# Patient Record
Sex: Female | Born: 1955 | Race: Black or African American | Hispanic: No | Smoking: Current every day smoker
Health system: Southern US, Community
[De-identification: ages and names within clinical notes are randomized; demographics above are authoritative.]

## PROBLEM LIST (undated history)

## (undated) DIAGNOSIS — M542 Cervicalgia: Secondary | ICD-10-CM

## (undated) DIAGNOSIS — I10 Essential (primary) hypertension: Secondary | ICD-10-CM

## (undated) DIAGNOSIS — M549 Dorsalgia, unspecified: Secondary | ICD-10-CM

## (undated) DIAGNOSIS — Z8709 Personal history of other diseases of the respiratory system: Secondary | ICD-10-CM

## (undated) DIAGNOSIS — R7303 Prediabetes: Secondary | ICD-10-CM

## (undated) DIAGNOSIS — M2540 Effusion, unspecified joint: Secondary | ICD-10-CM

## (undated) DIAGNOSIS — R002 Palpitations: Secondary | ICD-10-CM

## (undated) DIAGNOSIS — G8929 Other chronic pain: Secondary | ICD-10-CM

## (undated) DIAGNOSIS — K259 Gastric ulcer, unspecified as acute or chronic, without hemorrhage or perforation: Secondary | ICD-10-CM

## (undated) DIAGNOSIS — Z860100 Personal history of colon polyps, unspecified: Secondary | ICD-10-CM

## (undated) DIAGNOSIS — H35039 Hypertensive retinopathy, unspecified eye: Secondary | ICD-10-CM

## (undated) DIAGNOSIS — K862 Cyst of pancreas: Secondary | ICD-10-CM

## (undated) DIAGNOSIS — M419 Scoliosis, unspecified: Secondary | ICD-10-CM

## (undated) DIAGNOSIS — M797 Fibromyalgia: Secondary | ICD-10-CM

## (undated) DIAGNOSIS — M1990 Unspecified osteoarthritis, unspecified site: Secondary | ICD-10-CM

## (undated) DIAGNOSIS — J449 Chronic obstructive pulmonary disease, unspecified: Secondary | ICD-10-CM

## (undated) DIAGNOSIS — E785 Hyperlipidemia, unspecified: Secondary | ICD-10-CM

## (undated) DIAGNOSIS — G4730 Sleep apnea, unspecified: Secondary | ICD-10-CM

## (undated) DIAGNOSIS — K219 Gastro-esophageal reflux disease without esophagitis: Secondary | ICD-10-CM

## (undated) DIAGNOSIS — M810 Age-related osteoporosis without current pathological fracture: Secondary | ICD-10-CM

## (undated) DIAGNOSIS — R531 Weakness: Secondary | ICD-10-CM

## (undated) DIAGNOSIS — R011 Cardiac murmur, unspecified: Secondary | ICD-10-CM

## (undated) DIAGNOSIS — J189 Pneumonia, unspecified organism: Secondary | ICD-10-CM

## (undated) HISTORY — DX: Gastric ulcer, unspecified as acute or chronic, without hemorrhage or perforation: K25.9

## (undated) HISTORY — DX: Prediabetes: R73.03

## (undated) HISTORY — DX: Hypertensive retinopathy, unspecified eye: H35.039

## (undated) HISTORY — PX: COLONOSCOPY: SHX174

## (undated) HISTORY — DX: Age-related osteoporosis without current pathological fracture: M81.0

## (undated) HISTORY — PX: EYE SURGERY: SHX253

## (undated) HISTORY — DX: Cyst of pancreas: K86.2

## (undated) HISTORY — PX: OTHER SURGICAL HISTORY: SHX169

## (undated) HISTORY — PX: ESOPHAGOGASTRODUODENOSCOPY: SHX1529

---

## 2004-02-29 ENCOUNTER — Ambulatory Visit (HOSPITAL_BASED_OUTPATIENT_CLINIC_OR_DEPARTMENT_OTHER): Admission: RE | Admit: 2004-02-29 | Discharge: 2004-02-29 | Payer: Self-pay | Admitting: Family Medicine

## 2011-09-23 DIAGNOSIS — J189 Pneumonia, unspecified organism: Secondary | ICD-10-CM

## 2011-09-23 HISTORY — DX: Pneumonia, unspecified organism: J18.9

## 2012-08-26 ENCOUNTER — Emergency Department (HOSPITAL_COMMUNITY): Payer: Medicaid Other

## 2012-08-26 ENCOUNTER — Inpatient Hospital Stay (HOSPITAL_COMMUNITY)
Admission: AD | Admit: 2012-08-26 | Payer: Self-pay | Source: Other Acute Inpatient Hospital | Admitting: Internal Medicine

## 2012-08-26 ENCOUNTER — Inpatient Hospital Stay (HOSPITAL_COMMUNITY)
Admission: EM | Admit: 2012-08-26 | Discharge: 2012-08-31 | DRG: 469 | Disposition: A | Discharge status: Skilled Nursing Facility | Payer: Medicaid Other | Source: Ambulatory Visit | Attending: Internal Medicine | Admitting: Internal Medicine

## 2012-08-26 ENCOUNTER — Encounter (HOSPITAL_COMMUNITY): Payer: Self-pay | Admitting: Emergency Medicine

## 2012-08-26 ENCOUNTER — Emergency Department (HOSPITAL_COMMUNITY): Admission: EM | Admit: 2012-08-26 | Discharge: 2012-08-26 | Discharge status: Against Medical Advice | Payer: Self-pay

## 2012-08-26 DIAGNOSIS — I4891 Unspecified atrial fibrillation: Secondary | ICD-10-CM | POA: Diagnosis present

## 2012-08-26 DIAGNOSIS — K219 Gastro-esophageal reflux disease without esophagitis: Secondary | ICD-10-CM

## 2012-08-26 DIAGNOSIS — I480 Paroxysmal atrial fibrillation: Secondary | ICD-10-CM

## 2012-08-26 DIAGNOSIS — J449 Chronic obstructive pulmonary disease, unspecified: Secondary | ICD-10-CM

## 2012-08-26 DIAGNOSIS — Y9210 Unspecified residential institution as the place of occurrence of the external cause: Secondary | ICD-10-CM | POA: Diagnosis present

## 2012-08-26 DIAGNOSIS — R031 Nonspecific low blood-pressure reading: Secondary | ICD-10-CM | POA: Diagnosis present

## 2012-08-26 DIAGNOSIS — M549 Dorsalgia, unspecified: Secondary | ICD-10-CM | POA: Diagnosis present

## 2012-08-26 DIAGNOSIS — R55 Syncope and collapse: Secondary | ICD-10-CM | POA: Diagnosis present

## 2012-08-26 DIAGNOSIS — S72009A Fracture of unspecified part of neck of unspecified femur, initial encounter for closed fracture: Principal | ICD-10-CM | POA: Diagnosis present

## 2012-08-26 DIAGNOSIS — IMO0001 Reserved for inherently not codable concepts without codable children: Secondary | ICD-10-CM | POA: Diagnosis present

## 2012-08-26 DIAGNOSIS — E1169 Type 2 diabetes mellitus with other specified complication: Secondary | ICD-10-CM | POA: Diagnosis present

## 2012-08-26 DIAGNOSIS — E785 Hyperlipidemia, unspecified: Secondary | ICD-10-CM

## 2012-08-26 DIAGNOSIS — E119 Type 2 diabetes mellitus without complications: Secondary | ICD-10-CM

## 2012-08-26 DIAGNOSIS — W010XXA Fall on same level from slipping, tripping and stumbling without subsequent striking against object, initial encounter: Secondary | ICD-10-CM | POA: Diagnosis present

## 2012-08-26 DIAGNOSIS — J441 Chronic obstructive pulmonary disease with (acute) exacerbation: Secondary | ICD-10-CM | POA: Diagnosis present

## 2012-08-26 DIAGNOSIS — E569 Vitamin deficiency, unspecified: Secondary | ICD-10-CM

## 2012-08-26 DIAGNOSIS — S72002A Fracture of unspecified part of neck of left femur, initial encounter for closed fracture: Secondary | ICD-10-CM

## 2012-08-26 DIAGNOSIS — G4730 Sleep apnea, unspecified: Secondary | ICD-10-CM | POA: Diagnosis present

## 2012-08-26 DIAGNOSIS — Z720 Tobacco use: Secondary | ICD-10-CM

## 2012-08-26 DIAGNOSIS — F17200 Nicotine dependence, unspecified, uncomplicated: Secondary | ICD-10-CM | POA: Diagnosis present

## 2012-08-26 DIAGNOSIS — G8929 Other chronic pain: Secondary | ICD-10-CM | POA: Diagnosis present

## 2012-08-26 DIAGNOSIS — J189 Pneumonia, unspecified organism: Secondary | ICD-10-CM

## 2012-08-26 DIAGNOSIS — Z2989 Encounter for other specified prophylactic measures: Secondary | ICD-10-CM

## 2012-08-26 DIAGNOSIS — J45901 Unspecified asthma with (acute) exacerbation: Secondary | ICD-10-CM | POA: Diagnosis present

## 2012-08-26 DIAGNOSIS — S7290XA Unspecified fracture of unspecified femur, initial encounter for closed fracture: Secondary | ICD-10-CM

## 2012-08-26 DIAGNOSIS — J1100 Influenza due to unidentified influenza virus with unspecified type of pneumonia: Secondary | ICD-10-CM | POA: Diagnosis present

## 2012-08-26 DIAGNOSIS — Z418 Encounter for other procedures for purposes other than remedying health state: Secondary | ICD-10-CM

## 2012-08-26 HISTORY — DX: Sleep apnea, unspecified: G47.30

## 2012-08-26 HISTORY — DX: Other chronic pain: G89.29

## 2012-08-26 HISTORY — DX: Dorsalgia, unspecified: M54.9

## 2012-08-26 HISTORY — DX: Gastro-esophageal reflux disease without esophagitis: K21.9

## 2012-08-26 HISTORY — DX: Unspecified osteoarthritis, unspecified site: M19.90

## 2012-08-26 HISTORY — DX: Hyperlipidemia, unspecified: E78.5

## 2012-08-26 HISTORY — DX: Scoliosis, unspecified: M41.9

## 2012-08-26 HISTORY — DX: Fibromyalgia: M79.7

## 2012-08-26 MED ORDER — FENTANYL CITRATE 0.05 MG/ML IJ SOLN
25.0000 ug | INTRAMUSCULAR | Status: DC | PRN
  Administered 2012-08-27 (×4): 25 ug via INTRAVENOUS
  Administered 2012-08-27: 50 ug via INTRAVENOUS
  Administered 2012-08-27: 25 ug via INTRAVENOUS
  Filled 2012-08-26 (×3): qty 2

## 2012-08-26 MED ORDER — SODIUM CHLORIDE 0.9 % IV SOLN
INTRAVENOUS | Status: DC
  Administered 2012-08-27: 03:00:00 via INTRAVENOUS

## 2012-08-26 NOTE — ED Notes (Signed)
Pt was at [facility] and confirmed flu positive, was getting chest xray and "felt warm and dizzy and fell" fracturing left hip. At previous hospital received total of 5mg  Dilaudid (1mg  from EMS en route), 4mg  of Morphine, and 4mg  of Zofran since 1715.

## 2012-08-26 NOTE — ED Notes (Signed)
Patient transported to X-ray 

## 2012-08-26 NOTE — ED Provider Notes (Signed)
History     CSN: [id number]  Arrival date & time 08/26/12  2325   First MD Initiated Contact with Patient 08/26/12 2332      No chief complaint on file.   (Consider location/radiation/quality/duration/timing/severity/associated sxs/prior treatment) HPI History provided by patient. Has been sick for the last few days and her physician sent her to the hospital to get a chest x-ray. She been feeling weak and lightheaded and while getting a chest x-ray had a syncopal event and sustained a left hip fracture. This was at [facility]. She was evaluated there, diagnosed with hip fracture and noted to be positive for influenza.  There was no orthopedic surgeon available patient was sent here. Patient denies any shortness of breath, chest pain or fever at this time. She received IV Dilaudid in route and denies any pain. With syncopal event denies any chest pain or shortness of breath. Symptoms moderate to severe. Pain worse with movement of her hip. No associated weakness or numbness. She denies any other pain injury or trauma. No past medical history on file.  No past surgical history on file.  No family history on file.  History  Substance Use Topics  . Smoking status: Not on file  . Smokeless tobacco: Not on file  . Alcohol Use: Not on file    OB History    No data available      Review of Systems  Constitutional: Positive for fever.  HENT: Negative for neck pain, neck stiffness and voice change.   Eyes: Negative for visual disturbance.  Respiratory: Positive for cough. Negative for shortness of breath.   Cardiovascular: Negative for chest pain.  Gastrointestinal: Negative for vomiting and abdominal pain.  Genitourinary: Negative for dysuria.  Musculoskeletal: Negative for back pain.  Skin: Negative for rash.  Neurological: Positive for syncope. Negative for seizures and numbness.  All other systems reviewed and are negative.    Allergies  Review of patient's allergies  indicates not on file.  Home Medications  No current outpatient prescriptions on file.  There were no vitals taken for this visit.  Physical Exam  Constitutional: She is oriented to person, place, and time. She appears well-developed and well-nourished.  HENT:  Head: Normocephalic and atraumatic.  Eyes: Conjunctivae normal and EOM are normal. Pupils are equal, round, and reactive to light.  Neck: Trachea normal. Neck supple. No thyromegaly present.  Cardiovascular: Normal rate, regular rhythm, S1 normal, S2 normal and normal pulses.     No systolic murmur is present   No diastolic murmur is present  Pulses:      Radial pulses are 2+ on the right side, and 2+ on the left side.  Pulmonary/Chest: Effort normal and breath sounds normal. She has no wheezes. She has no rhonchi. She has no rales. She exhibits no tenderness.  Abdominal: Soft. Normal appearance and bowel sounds are normal. There is no tenderness. There is no CVA tenderness and negative Murphy's sign.  Musculoskeletal:       Tender over left greater trochanter with decreased range of motion secondary to pain. Distal neurovascular is intact. Pelvis stable. Nontender over left knee and ankle.  Neurological: She is alert and oriented to person, place, and time. She has normal strength. No cranial nerve deficit or sensory deficit. GCS eye subscore is 4. GCS verbal subscore is 5. GCS motor subscore is 6.  Skin: Skin is warm and dry. No rash noted. She is not diaphoretic.  Psychiatric: Her speech is normal.  Cooperative and appropriate    ED Course  Procedures (including critical care time)  Results for orders placed during the hospital encounter of 08/26/12  CBC WITH DIFFERENTIAL      Component Value Range   WBC 5.3  4.0 - 10.5 K/uL   RBC 4.17  3.87 - 5.11 MIL/uL   Hemoglobin 13.2  12.0 - 15.0 g/dL   HCT 40.9  81.1 - 91.4 %   MCV 94.7  78.0 - 100.0 fL   MCH 31.7  26.0 - 34.0 pg   MCHC 33.4  30.0 - 36.0 g/dL   RDW 78.2   95.6 - 21.3 %   Platelets 129 (*) 150 - 400 K/uL   Neutrophils Relative 66  43 - 77 %   Neutro Abs 3.5  1.7 - 7.7 K/uL   Lymphocytes Relative 21  12 - 46 %   Lymphs Abs 1.1  0.7 - 4.0 K/uL   Monocytes Relative 13 (*) 3 - 12 %   Monocytes Absolute 0.7  0.1 - 1.0 K/uL   Eosinophils Relative 0  0 - 5 %   Eosinophils Absolute 0.0  0.0 - 0.7 K/uL   Basophils Relative 0  0 - 1 %   Basophils Absolute 0.0  0.0 - 0.1 K/uL  PROTIME-INR      Component Value Range   Prothrombin Time 13.5  11.6 - 15.2 seconds   INR 1.04  0.00 - 1.49  TYPE AND SCREEN      Component Value Range   ABO/RH(D) O POS     Antibody Screen NEG     Sample Expiration 08/30/2012    POCT I-STAT, CHEM 8      Component Value Range   Sodium 138  135 - 145 mEq/L   Potassium 3.8  3.5 - 5.1 mEq/L   Chloride 104  96 - 112 mEq/L   BUN 10  6 - 23 mg/dL   Creatinine, Ser 0.86  0.50 - 1.10 mg/dL   Glucose, Bld 578 (*) 70 - 99 mg/dL   Calcium, Ion 4.69 (*) 1.12 - 1.23 mmol/L   TCO2 23  0 - 100 mmol/L   Hemoglobin 13.9  12.0 - 15.0 g/dL   HCT 62.9  52.8 - 41.3 %  ABO/RH      Component Value Range   ABO/RH(D) O POS     Dg Chest 2 View  08/27/2012  *RADIOLOGY REPORT*  Clinical Data: Syncope.  Left hip fracture. Pre-op respiratory exam.  CHEST - 2 VIEW  Comparison:  08/26/2012  Findings:  The heart size and mediastinal contours are within normal limits.  Both lungs are clear.  The visualized skeletal structures are unremarkable.  IMPRESSION: No active cardiopulmonary disease.   Original Report Authenticated By: Myles Rosenthal, M.D.     Date: 08/26/2012  Rate: 84  Rhythm: normal sinus rhythm  QRS Axis: normal  Intervals: normal  ST/T Wave abnormalities: nonspecific ST changes  Conduction Disutrbances:none  Narrative Interpretation:   Old EKG Reviewed: none available  IVFs.NPO. IV fentanyl prn  Outside records reviewed and patient had a blood pressure of 61/45 in the emergency department after syncopal event. She received IV  fluids and imaging of her hip demonstrating left femoral neck fracture. EMS reported positive flu test. Isolation precautions initiated here in the emergency department.  12:21 AM outside films reviewed - has L femoral neck Fx.  Discussed as above with Dr Frederik Pear, will consult in am. Plan MED admit.   1:36 AM d/w DR Joneen Roach,  will admit MDM   Syncope with fall and left hip fracture. Patient tested flu positive at outside hospital. Pain control. Imaging reviewed. EKG. Labs. Ortho consult. Medical admission.        Sunnie Nielsen, MD 08/27/12 [phone]

## 2012-08-27 ENCOUNTER — Emergency Department (HOSPITAL_COMMUNITY): Payer: Medicaid Other

## 2012-08-27 ENCOUNTER — Encounter (HOSPITAL_COMMUNITY): Payer: Self-pay | Admitting: Certified Registered"

## 2012-08-27 ENCOUNTER — Inpatient Hospital Stay (HOSPITAL_COMMUNITY): Payer: Medicaid Other | Admitting: Certified Registered"

## 2012-08-27 ENCOUNTER — Encounter (HOSPITAL_COMMUNITY)
Admission: EM | Disposition: A | Discharge status: Skilled Nursing Facility | Payer: Self-pay | Source: Ambulatory Visit | Attending: Family Medicine

## 2012-08-27 ENCOUNTER — Encounter (HOSPITAL_COMMUNITY): Payer: Self-pay | Admitting: General Practice

## 2012-08-27 DIAGNOSIS — I4891 Unspecified atrial fibrillation: Secondary | ICD-10-CM

## 2012-08-27 DIAGNOSIS — S7290XA Unspecified fracture of unspecified femur, initial encounter for closed fracture: Secondary | ICD-10-CM | POA: Diagnosis present

## 2012-08-27 DIAGNOSIS — E785 Hyperlipidemia, unspecified: Secondary | ICD-10-CM

## 2012-08-27 DIAGNOSIS — I480 Paroxysmal atrial fibrillation: Secondary | ICD-10-CM

## 2012-08-27 DIAGNOSIS — E569 Vitamin deficiency, unspecified: Secondary | ICD-10-CM

## 2012-08-27 DIAGNOSIS — S72009A Fracture of unspecified part of neck of unspecified femur, initial encounter for closed fracture: Principal | ICD-10-CM

## 2012-08-27 DIAGNOSIS — J449 Chronic obstructive pulmonary disease, unspecified: Secondary | ICD-10-CM

## 2012-08-27 DIAGNOSIS — Z720 Tobacco use: Secondary | ICD-10-CM

## 2012-08-27 DIAGNOSIS — E119 Type 2 diabetes mellitus without complications: Secondary | ICD-10-CM

## 2012-08-27 DIAGNOSIS — K219 Gastro-esophageal reflux disease without esophagitis: Secondary | ICD-10-CM

## 2012-08-27 HISTORY — PX: HIP ARTHROPLASTY: SHX981

## 2012-08-27 LAB — TYPE AND SCREEN
ABO/RH(D): O POS
Antibody Screen: NEGATIVE

## 2012-08-27 LAB — CBC
HCT: 37.2 % (ref 36.0–46.0)
Hemoglobin: 12.4 g/dL (ref 12.0–15.0)
MCH: 31.6 pg (ref 26.0–34.0)
MCHC: 33.3 g/dL (ref 30.0–36.0)
MCV: 94.7 fL (ref 78.0–100.0)
RDW: 13.5 % (ref 11.5–15.5)

## 2012-08-27 LAB — HEMOGLOBIN A1C
Hgb A1c MFr Bld: 6.5 % — ABNORMAL HIGH (ref <5.7)
Mean Plasma Glucose: 140 mg/dL — ABNORMAL HIGH (ref <117)

## 2012-08-27 LAB — CBC WITH DIFFERENTIAL/PLATELET
Hemoglobin: 13.2 g/dL (ref 12.0–15.0)
Lymphocytes Relative: 21 % (ref 12–46)
Lymphs Abs: 1.1 10*3/uL (ref 0.7–4.0)
Monocytes Relative: 13 % — ABNORMAL HIGH (ref 3–12)
Neutrophils Relative %: 66 % (ref 43–77)
Platelets: 129 10*3/uL — ABNORMAL LOW (ref 150–400)
RBC: 4.17 MIL/uL (ref 3.87–5.11)
WBC: 5.3 10*3/uL (ref 4.0–10.5)

## 2012-08-27 LAB — GLUCOSE, CAPILLARY
Glucose-Capillary: 114 mg/dL — ABNORMAL HIGH (ref 70–99)
Glucose-Capillary: 220 mg/dL — ABNORMAL HIGH (ref 70–99)

## 2012-08-27 LAB — POCT I-STAT, CHEM 8
Calcium, Ion: 1.1 mmol/L — ABNORMAL LOW (ref 1.12–1.23)
Glucose, Bld: 112 mg/dL — ABNORMAL HIGH (ref 70–99)
HCT: 41 % (ref 36.0–46.0)
Hemoglobin: 13.9 g/dL (ref 12.0–15.0)
TCO2: 23 mmol/L (ref 0–100)

## 2012-08-27 LAB — BASIC METABOLIC PANEL
BUN: 11 mg/dL (ref 6–23)
Creatinine, Ser: 0.8 mg/dL (ref 0.50–1.10)
GFR calc Af Amer: >90 mL/min (ref >90)
GFR calc non Af Amer: 81 mL/min — ABNORMAL LOW (ref >90)
Glucose, Bld: 111 mg/dL — ABNORMAL HIGH (ref 70–99)

## 2012-08-27 LAB — PROTIME-INR
INR: 1.04 (ref 0.00–1.49)
Prothrombin Time: 13.5 seconds (ref 11.6–15.2)

## 2012-08-27 SURGERY — HEMIARTHROPLASTY, HIP, DIRECT ANTERIOR APPROACH, FOR FRACTURE
Anesthesia: General | Site: Hip | Laterality: Left | Wound class: Clean

## 2012-08-27 MED ORDER — SENNOSIDES-DOCUSATE SODIUM 8.6-50 MG PO TABS: 1.0000 | ORAL_TABLET | Freq: Every evening | ORAL | Status: DC | PRN

## 2012-08-27 MED ORDER — PANTOPRAZOLE SODIUM 40 MG PO TBEC
40.0000 mg | DELAYED_RELEASE_TABLET | Freq: Every day | ORAL | Status: DC
  Administered 2012-08-28: 40 mg via ORAL
  Filled 2012-08-27: qty 1

## 2012-08-27 MED ORDER — ENOXAPARIN SODIUM 40 MG/0.4ML ~~LOC~~ SOLN
40.0000 mg | SUBCUTANEOUS | Status: DC
  Administered 2012-08-28 – 2012-08-30 (×3): 40 mg via SUBCUTANEOUS
  Filled 2012-08-27 (×5): qty 0.4

## 2012-08-27 MED ORDER — GLIPIZIDE ER 10 MG PO TB24
10.0000 mg | ORAL_TABLET | Freq: Every day | ORAL | Status: DC
  Administered 2012-08-28 – 2012-08-29 (×2): 10 mg via ORAL
  Filled 2012-08-27 (×4): qty 1

## 2012-08-27 MED ORDER — ROCURONIUM BROMIDE 100 MG/10ML IV SOLN
INTRAVENOUS | Status: DC | PRN
  Administered 2012-08-27: 50 mg via INTRAVENOUS

## 2012-08-27 MED ORDER — LIDOCAINE HCL (CARDIAC) 20 MG/ML IV SOLN
INTRAVENOUS | Status: DC | PRN
  Administered 2012-08-27: 80 mg via INTRAVENOUS

## 2012-08-27 MED ORDER — LACTATED RINGERS IV SOLN
INTRAVENOUS | Status: DC
  Administered 2012-08-27: 13:00:00 via INTRAVENOUS

## 2012-08-27 MED ORDER — GLYCOPYRROLATE 0.2 MG/ML IJ SOLN
INTRAMUSCULAR | Status: DC | PRN
  Administered 2012-08-27: 0.4 mg via INTRAVENOUS

## 2012-08-27 MED ORDER — IPRATROPIUM BROMIDE 0.02 % IN SOLN: 0.5000 mg | RESPIRATORY_TRACT | Status: DC | PRN

## 2012-08-27 MED ORDER — NICOTINE 14 MG/24HR TD PT24
14.0000 mg | MEDICATED_PATCH | Freq: Every day | TRANSDERMAL | Status: DC
  Administered 2012-08-27 – 2012-08-31 (×5): 14 mg via TRANSDERMAL
  Filled 2012-08-27 (×5): qty 1

## 2012-08-27 MED ORDER — ACEBUTOLOL HCL 400 MG PO CAPS
400.0000 mg | ORAL_CAPSULE | Freq: Two times a day (BID) | ORAL | Status: DC
  Administered 2012-08-27 – 2012-08-31 (×8): 400 mg via ORAL
  Filled 2012-08-27 (×10): qty 1

## 2012-08-27 MED ORDER — NEOSTIGMINE METHYLSULFATE 1 MG/ML IJ SOLN
INTRAMUSCULAR | Status: DC | PRN
  Administered 2012-08-27: 3 mg via INTRAVENOUS

## 2012-08-27 MED ORDER — MORPHINE SULFATE 2 MG/ML IJ SOLN
2.0000 mg | INTRAMUSCULAR | Status: DC | PRN
  Administered 2012-08-27 – 2012-08-28 (×6): 2 mg via INTRAVENOUS
  Filled 2012-08-27 (×6): qty 1

## 2012-08-27 MED ORDER — FENTANYL CITRATE 0.05 MG/ML IJ SOLN
INTRAMUSCULAR | Status: AC
  Filled 2012-08-27: qty 2

## 2012-08-27 MED ORDER — ACETAMINOPHEN 325 MG PO TABS
650.0000 mg | ORAL_TABLET | Freq: Four times a day (QID) | ORAL | Status: DC | PRN
  Administered 2012-08-27 – 2012-08-28 (×2): 650 mg via ORAL
  Filled 2012-08-27 (×2): qty 2

## 2012-08-27 MED ORDER — MIDAZOLAM HCL 5 MG/5ML IJ SOLN
INTRAMUSCULAR | Status: DC | PRN
  Administered 2012-08-27: 2 mg via INTRAVENOUS

## 2012-08-27 MED ORDER — LACTATED RINGERS IV SOLN
INTRAVENOUS | Status: DC | PRN
  Administered 2012-08-27 (×2): via INTRAVENOUS

## 2012-08-27 MED ORDER — FENTANYL CITRATE 0.05 MG/ML IJ SOLN
INTRAMUSCULAR | Status: DC | PRN
  Administered 2012-08-27 (×2): 50 ug via INTRAVENOUS
  Administered 2012-08-27: 100 ug via INTRAVENOUS
  Administered 2012-08-27: 50 ug via INTRAVENOUS

## 2012-08-27 MED ORDER — SUCRALFATE 1 G PO TABS
1.0000 g | ORAL_TABLET | Freq: Four times a day (QID) | ORAL | Status: DC
  Administered 2012-08-27 – 2012-08-31 (×6): 1 g via ORAL
  Filled 2012-08-27 (×20): qty 1

## 2012-08-27 MED ORDER — ONDANSETRON HCL 4 MG PO TABS: 4.0000 mg | ORAL_TABLET | Freq: Four times a day (QID) | ORAL | Status: DC | PRN

## 2012-08-27 MED ORDER — INSULIN ASPART 100 UNIT/ML ~~LOC~~ SOLN
0.0000 [IU] | Freq: Four times a day (QID) | SUBCUTANEOUS | Status: DC
  Administered 2012-08-27: 5 [IU] via SUBCUTANEOUS
  Administered 2012-08-28: 3 [IU] via SUBCUTANEOUS
  Administered 2012-08-28: 2 [IU] via SUBCUTANEOUS
  Administered 2012-08-28: 5 [IU] via SUBCUTANEOUS
  Administered 2012-08-29: 3 [IU] via SUBCUTANEOUS

## 2012-08-27 MED ORDER — ALBUTEROL SULFATE (5 MG/ML) 0.5% IN NEBU: 2.5000 mg | INHALATION_SOLUTION | RESPIRATORY_TRACT | Status: DC | PRN

## 2012-08-27 MED ORDER — PREDNISONE 50 MG PO TABS
50.0000 mg | ORAL_TABLET | Freq: Every day | ORAL | Status: DC
Start: 2012-08-28 — End: 2012-08-28
  Administered 2012-08-28: 50 mg via ORAL
  Filled 2012-08-27 (×2): qty 1

## 2012-08-27 MED ORDER — ONDANSETRON HCL 4 MG/2ML IJ SOLN: 4.0000 mg | Freq: Four times a day (QID) | INTRAMUSCULAR | Status: DC | PRN

## 2012-08-27 MED ORDER — ONDANSETRON HCL 4 MG/2ML IJ SOLN
INTRAMUSCULAR | Status: DC | PRN
  Administered 2012-08-27: 4 mg via INTRAVENOUS

## 2012-08-27 MED ORDER — CEFAZOLIN SODIUM-DEXTROSE 2-3 GM-% IV SOLR
INTRAVENOUS | Status: DC | PRN
  Administered 2012-08-27: 2 g via INTRAVENOUS

## 2012-08-27 MED ORDER — PHENYLEPHRINE HCL 10 MG/ML IJ SOLN
INTRAMUSCULAR | Status: DC | PRN
  Administered 2012-08-27 (×3): 80 ug via INTRAVENOUS

## 2012-08-27 MED ORDER — METFORMIN HCL 500 MG PO TABS
500.0000 mg | ORAL_TABLET | Freq: Two times a day (BID) | ORAL | Status: DC
  Administered 2012-08-28 – 2012-08-31 (×7): 500 mg via ORAL
  Filled 2012-08-27 (×11): qty 1

## 2012-08-27 MED ORDER — SODIUM CHLORIDE 0.9 % IR SOLN
Status: DC | PRN
  Administered 2012-08-27: 1000 mL

## 2012-08-27 MED ORDER — PROPOFOL 10 MG/ML IV BOLUS
INTRAVENOUS | Status: DC | PRN
  Administered 2012-08-27: 180 mg via INTRAVENOUS

## 2012-08-27 MED ORDER — SODIUM CHLORIDE 0.9 % IV BOLUS (SEPSIS)
500.0000 mL | Freq: Once | INTRAVENOUS | Status: AC
  Administered 2012-08-27: 500 mL via INTRAVENOUS

## 2012-08-27 MED ORDER — LEVOFLOXACIN 750 MG PO TABS
750.0000 mg | ORAL_TABLET | Freq: Every day | ORAL | Status: DC
  Administered 2012-08-27 – 2012-08-31 (×5): 750 mg via ORAL
  Filled 2012-08-27 (×6): qty 1

## 2012-08-27 MED ORDER — ALUM & MAG HYDROXIDE-SIMETH 200-200-20 MG/5ML PO SUSP: 30.0000 mL | Freq: Four times a day (QID) | ORAL | Status: DC | PRN

## 2012-08-27 MED ORDER — ACETAMINOPHEN 650 MG RE SUPP: 650.0000 mg | Freq: Four times a day (QID) | RECTAL | Status: DC | PRN

## 2012-08-27 MED ORDER — POTASSIUM CHLORIDE IN NACL 20-0.9 MEQ/L-% IV SOLN
INTRAVENOUS | Status: DC
  Administered 2012-08-27 – 2012-08-28 (×5): via INTRAVENOUS
  Filled 2012-08-27 (×7): qty 1000

## 2012-08-27 MED ORDER — OXYCODONE-ACETAMINOPHEN 5-325 MG PO TABS
1.0000 | ORAL_TABLET | ORAL | Status: DC | PRN
  Administered 2012-08-27: 1 via ORAL
  Administered 2012-08-27: 2 via ORAL
  Administered 2012-08-28 – 2012-08-31 (×6): 1 via ORAL
  Filled 2012-08-27 (×7): qty 1

## 2012-08-27 MED ORDER — ACETAMINOPHEN 10 MG/ML IV SOLN
INTRAVENOUS | Status: DC | PRN
  Administered 2012-08-27: 1000 mg via INTRAVENOUS

## 2012-08-27 SURGICAL SUPPLY — 62 items
BLADE SAW SAG 73X25 THK (BLADE) ×1
BLADE SAW SGTL 73X25 THK (BLADE) ×1 IMPLANT
BLADE SURG ROTATE 9660 (MISCELLANEOUS) IMPLANT
BRUSH FEMORAL CANAL (MISCELLANEOUS) IMPLANT
CLOTH BEACON ORANGE TIMEOUT ST (SAFETY) ×2 IMPLANT
COVER SURGICAL LIGHT HANDLE (MISCELLANEOUS) ×2 IMPLANT
DRAPE INCISE IOBAN 66X45 STRL (DRAPES) IMPLANT
DRAPE ORTHO SPLIT 77X108 STRL (DRAPES) ×2
DRAPE PROXIMA HALF (DRAPES) ×2 IMPLANT
DRAPE SURG ORHT 6 SPLT 77X108 (DRAPES) ×2 IMPLANT
DRAPE U-SHAPE 47X51 STRL (DRAPES) ×2 IMPLANT
DRILL BIT 7/64X5 (BIT) ×2 IMPLANT
DRSG ADAPTIC 3X8 NADH LF (GAUZE/BANDAGES/DRESSINGS) IMPLANT
DRSG MEPILEX BORDER 4X12 (GAUZE/BANDAGES/DRESSINGS) ×2 IMPLANT
DRSG PAD ABDOMINAL 8X10 ST (GAUZE/BANDAGES/DRESSINGS) IMPLANT
DURAPREP 26ML APPLICATOR (WOUND CARE) ×2 IMPLANT
ELECT BLADE 6.5 EXT (BLADE) IMPLANT
ELECT REM PT RETURN 9FT ADLT (ELECTROSURGICAL) ×2
ELECTRODE REM PT RTRN 9FT ADLT (ELECTROSURGICAL) ×1 IMPLANT
EVACUATOR 1/8 PVC DRAIN (DRAIN) IMPLANT
GLOVE BIO SURGEON STRL SZ8.5 (GLOVE) IMPLANT
GLOVE BIOGEL PI IND STRL 8 (GLOVE) ×1 IMPLANT
GLOVE BIOGEL PI IND STRL 8.5 (GLOVE) IMPLANT
GLOVE BIOGEL PI INDICATOR 8 (GLOVE) ×1
GLOVE BIOGEL PI INDICATOR 8.5 (GLOVE)
GLOVE SS BIOGEL STRL SZ 8 (GLOVE) ×1 IMPLANT
GLOVE SUPERSENSE BIOGEL SZ 8 (GLOVE) ×1
GOWN PREVENTION PLUS XLARGE (GOWN DISPOSABLE) ×4 IMPLANT
GOWN PREVENTION PLUS XXLARGE (GOWN DISPOSABLE) IMPLANT
GOWN STRL NON-REIN LRG LVL3 (GOWN DISPOSABLE) ×2 IMPLANT
HANDPIECE INTERPULSE COAX TIP (DISPOSABLE)
HOOD PEEL AWAY FACE SHEILD DIS (HOOD) IMPLANT
IMMOBILIZER KNEE 20 (SOFTGOODS)
IMMOBILIZER KNEE 20 THIGH 36 (SOFTGOODS) IMPLANT
IMMOBILIZER KNEE 22 UNIV (SOFTGOODS) ×2 IMPLANT
IMMOBILIZER KNEE 24 THIGH 36 (MISCELLANEOUS) IMPLANT
IMMOBILIZER KNEE 24 UNIV (MISCELLANEOUS)
KIT BASIN OR (CUSTOM PROCEDURE TRAY) ×2 IMPLANT
KIT ROOM TURNOVER OR (KITS) ×2 IMPLANT
MANIFOLD NEPTUNE II (INSTRUMENTS) ×2 IMPLANT
NDL SUT 2 .5 CRC MAYO 1.732X (NEEDLE) IMPLANT
NEEDLE 1/2 CIR MAYO (NEEDLE) IMPLANT
NEEDLE MAYO TAPER (NEEDLE)
NS IRRIG 1000ML POUR BTL (IV SOLUTION) ×2 IMPLANT
PACK TOTAL JOINT (CUSTOM PROCEDURE TRAY) ×2 IMPLANT
PAD ARMBOARD 7.5X6 YLW CONV (MISCELLANEOUS) ×2 IMPLANT
PASSER SUT SWANSON 36MM LOOP (INSTRUMENTS) ×2 IMPLANT
SET HNDPC FAN SPRY TIP SCT (DISPOSABLE) IMPLANT
SPONGE GAUZE 4X4 12PLY (GAUZE/BANDAGES/DRESSINGS) IMPLANT
STAPLER VISISTAT 35W (STAPLE) ×2 IMPLANT
SUCTION FRAZIER TIP 10 FR DISP (SUCTIONS) ×2 IMPLANT
SUT ETHIBOND NAB CT1 #1 30IN (SUTURE) ×6 IMPLANT
SUT VIC AB 0 CT1 27 (SUTURE) ×1
SUT VIC AB 0 CT1 27XBRD ANBCTR (SUTURE) ×1 IMPLANT
SUT VIC AB 1 CTB1 27 (SUTURE) ×4 IMPLANT
SUT VIC AB 2-0 CT1 27 (SUTURE) ×1
SUT VIC AB 2-0 CT1 TAPERPNT 27 (SUTURE) ×1 IMPLANT
TOWEL OR 17X24 6PK STRL BLUE (TOWEL DISPOSABLE) ×2 IMPLANT
TOWEL OR 17X26 10 PK STRL BLUE (TOWEL DISPOSABLE) ×2 IMPLANT
TOWER CARTRIDGE SMART MIX (DISPOSABLE) IMPLANT
TRAY FOLEY CATH 14FR (SET/KITS/TRAYS/PACK) IMPLANT
WATER STERILE IRR 1000ML POUR (IV SOLUTION) ×4 IMPLANT

## 2012-08-27 NOTE — Preoperative (Signed)
Beta Blockers   Reason not to administer Beta Blockers:Not Applicable 

## 2012-08-27 NOTE — ED Notes (Signed)
Patient is resting comfortably. Phlebotomy at bedside

## 2012-08-27 NOTE — H&P (Signed)
PCP:   Westley Foots    Chief Complaint:  Femur fracture  HPI: This is an unfortunate 56 year old female who has been ill with respiratory symptoms all week. She reports some wheezing, shortness of breath, nonproductive cough . She was sent for chest x-ray today, while in the hospital radiology department she syncopized and fell. She was unable to get up, she was taken to the ER where x-rays revealed femoral neck fracture. Initially patient's blood pressure 6/45, she received IV fluid hydration and has responded appropriately. Patient denies any chest pains or palpitations prior to syncopizing. She does report some chest pain more coughing since she's been feeling ill. History provided by the patient  Review of Systems:  The patient denies anorexia, fever, weight loss,, vision loss, decreased hearing, hoarseness, dyspnea on exertion, peripheral edema, balance deficits, hemoptysis, abdominal pain, melena, hematochezia, severe indigestion/heartburn, hematuria, incontinence, genital sores, muscle weakness, suspicious skin lesions, transient blindness, difficulty walking, depression, unusual weight change, abnormal bleeding, enlarged lymph nodes, angioedema, and breast masses.  Past Medical History: Past Medical History  Diagnosis Date  . Asthma   . Hyperlipidemia   . GERD (gastroesophageal reflux disease)   . Scoliosis   . Flu   . Atrial fibrillation   . Sleep apnea     "have a mask but I don't wear it" (08/27/2012)  . Type II diabetes mellitus     "borderline" (08/27/2012)  . Arthritis     "joints; fingers; shoulders; knees; back" (08/27/2012)  . Chronic back pain   . Fibromyalgia     "dx'd long time ago; before they really knew what it was" (112/02/2012)  . Syncope and collapse 08/26/2012    "broke left hip" (08/27/2012)   Past Surgical History  Procedure Date  . No past surgeries     Medications: Prior to Admission medications   Medication Sig Start Date End Date Taking? Authorizing  Provider  acebutolol (SECTRAL) 400 MG capsule Take 400 mg by mouth 2 (two) times daily.   Yes Historical Provider, MD  albuterol (PROVENTIL HFA;VENTOLIN HFA) 108 (90 BASE) MCG/ACT inhaler Inhale 2 puffs into the lungs every 6 (six) hours as needed. For breathing   Yes Historical Provider, MD  albuterol-ipratropium (COMBIVENT) 18-103 MCG/ACT inhaler Inhale 2 puffs into the lungs every 6 (six) hours as needed. For breathing   Yes Historical Provider, MD  desvenlafaxine (PRISTIQ) 50 MG 24 hr tablet Take 50 mg by mouth daily.   Yes Historical Provider, MD  glipiZIDE (GLUCOTROL XL) 10 MG 24 hr tablet Take 10 mg by mouth daily.   Yes Historical Provider, MD  lovastatin (MEVACOR) 20 MG tablet Take 20 mg by mouth at bedtime.   Yes Historical Provider, MD  magnesium oxide (MAG-OX) 400 MG tablet Take 400 mg by mouth daily.   Yes Historical Provider, MD  medroxyPROGESTERone (DEPO-PROVERA) 150 MG/ML injection Inject 150 mg into the muscle every 3 (three) months.   Yes Historical Provider, MD  metFORMIN (GLUCOPHAGE) 500 MG tablet Take 500 mg by mouth 2 (two) times daily with a meal.   Yes Historical Provider, MD  omeprazole (PRILOSEC) 20 MG capsule Take 20 mg by mouth daily.   Yes Historical Provider, MD  oxyCODONE-acetaminophen (PERCOCET/ROXICET) 5-325 MG per tablet Take 1 tablet by mouth every 4 (four) hours as needed. For pain   Yes Historical Provider, MD  sucralfate (CARAFATE) 1 G tablet Take 1 g by mouth 4 (four) times daily.   Yes Historical Provider, MD    Allergies:  No Known  Allergies  Social History:  reports that she has been smoking Cigarettes.  She has a 18 pack-year smoking history. She has never used smokeless tobacco. She reports that she does not drink alcohol or use illicit drugs.  Family History: Family History  Problem Relation Age of Onset  . Breast cancer    . Throat cancer    . Lung cancer      Physical Exam: Filed Vitals:   08/26/12 2336 08/26/12 2345 08/27/12 0130  BP:  129/71 129/81 108/54  Pulse:  90 89  Temp: 99.7 F (37.6 C)    TempSrc: Oral    Resp: 20 17 18   SpO2: 92% 97% 92%    General:  Alert and oriented times three, well developed and nourished, no acute distress Eyes: PERRLA, pink conjunctiva, no scleral icterus ENT: dry oral mucosa, neck supple, no thyromegaly Lungs: clear to ascultation, no wheeze, no crackles, no use of accessory muscles Cardiovascular: regular rate and rhythm, no regurgitation, no gallops, no murmurs. No carotid bruits, no JVD Abdomen: soft, positive BS, non-tender, non-distended, no organomegaly, not an acute abdomen GU: not examined Neuro: CN II - XII grossly intact, sensation intact Musculoskeletal: strength 5/5 all extremities except left lower extremity no clubbing, cyanosis or edema Skin: no rash, no subcutaneous crepitation, no decubitus Psych: appropriate patient   Labs on Admission:   Basename 08/27/12 0107  NA 138  K 3.8  CL 104  CO2 --  GLUCOSE 112*  BUN 10  CREATININE 0.90  CALCIUM --  MG --  PHOS --   No results found for this basename: AST:2,ALT:2,ALKPHOS:2,BILITOT:2,PROT:2,ALBUMIN:2 in the last 72 hours No results found for this basename: LIPASE:2,AMYLASE:2 in the last 72 hours  Basename 08/27/12 0107 08/27/12 0023  WBC -- 5.3  NEUTROABS -- 3.5  HGB 13.9 13.2  HCT 41.0 39.5  MCV -- 94.7  PLT -- 129*    Basename 08/27/12 0023  CKTOTAL --  CKMB --  CKMBINDEX --  TROPONINI <0.30    Micro Results: No results found for this or any previous visit (from the past 240 hour(s)).   Radiological Exams on Admission: Dg Chest 2 View  08/27/2012  *RADIOLOGY REPORT*  Clinical Data: Syncope.  Left hip fracture. Pre-op respiratory exam.  CHEST - 2 VIEW  Comparison:  08/26/2012  Findings:  The heart size and mediastinal contours are within normal limits.  Both lungs are clear.  The visualized skeletal structures are unremarkable.  IMPRESSION: No active cardiopulmonary disease.   Original Report  Authenticated By: Myles Rosenthal, M.D.    X-ray left hip: [From [facility]]: Left femoral neck fracture EKG: Normal sinus rhythm  Assessment/Plan Present on Admission:  . Femoral fractureleft Admit to MedSurg N.p.o., IV fluid hydration, pain control Orthopedic surgeon Dr. Daniel Nones is aware Patient is a minimum risk surgical intervention Bronchitis/COPD exacerbation Tobacco abuse Nicotine patch, nebulizers and antibiotics Syncope Due to transient hypotension, IV fluid hydration Monitor in telemetry Diabetes mellitus Dyslipidemia Paroxysmal A. Fib Stable resume home medications, ADA diet and set scale insulin  Full code DVT prophylaxis  Sabien Umland 08/27/2012, 2:09 AM

## 2012-08-27 NOTE — Anesthesia Procedure Notes (Signed)
Procedure Name: Intubation Date/Time: 08/27/2012 2:43 PM Performed by: Jerilee Hoh Pre-anesthesia Checklist: Patient identified, Emergency Drugs available, Suction available and Patient being monitored Patient Re-evaluated:Patient Re-evaluated prior to inductionOxygen Delivery Method: Circle system utilized Preoxygenation: Pre-oxygenation with 100% oxygen Intubation Type: IV induction Ventilation: Mask ventilation without difficulty Laryngoscope Size: Mac and 3 Grade View: Grade I Tube type: Oral Tube size: 7.5 mm Number of attempts: 1 Airway Equipment and Method: Stylet Placement Confirmation: ETT inserted through vocal cords under direct vision,  positive ETCO2 and breath sounds checked- equal and bilateral Secured at: 22 cm Tube secured with: Tape Dental Injury: Teeth and Oropharynx as per pre-operative assessment

## 2012-08-27 NOTE — Interval H&P Note (Signed)
History and Physical Interval Note:  08/27/2012 1:54 PM  Tina [name]  has presented today for surgery, with the diagnosis of l fem neck fracture  The various methods of treatment have been discussed with the patient and family. After consideration of risks, benefits and other options for treatment, the patient has consented to  Procedure(s) (LRB) with comments: ARTHROPLASTY BIPOLAR HIP (Left) as a surgical intervention .  The patient's history has been reviewed, patient examined, no change in status, stable for surgery.  I have reviewed the patient's chart and labs.  Questions were answered to the patient's satisfaction.     Sahana Boyland G

## 2012-08-27 NOTE — Anesthesia Preprocedure Evaluation (Addendum)
Anesthesia Evaluation  Patient identified by MRN, date of birth, ID band Patient awake    Reviewed: Allergy & Precautions, H&P , NPO status   Airway Mallampati: I  Neck ROM: Full    Dental  (+) Teeth Intact, Caps and Dental Advisory Given,    Pulmonary asthma , sleep apnea , COPDCurrent Smoker,  Pt w flu like illness vs pneumonia, +_cough   + wheezing      Cardiovascular + dysrhythmias Rhythm:Regular Rate:Normal     Neuro/Psych  Neuromuscular disease    GI/Hepatic GERD-  ,  Endo/Other  diabetes, Type 2  Renal/GU      Musculoskeletal  (+) Fibromyalgia -  Abdominal (+) + obese,   Peds  Hematology   Anesthesia Other Findings   Reproductive/Obstetrics                          Anesthesia Physical Anesthesia Plan  ASA: III  Anesthesia Plan: General   Post-op Pain Management:    Induction: Intravenous  Airway Management Planned: Oral ETT  Additional Equipment:   Intra-op Plan:   Post-operative Plan: Extubation in OR  Informed Consent: I have reviewed the patients History and Physical, chart, labs and discussed the procedure including the risks, benefits and alternatives for the proposed anesthesia with the patient or authorized representative who has indicated his/her understanding and acceptance.   Dental advisory given  Plan Discussed with: CRNA and Surgeon  Anesthesia Plan Comments:         Anesthesia Quick Evaluation

## 2012-08-27 NOTE — Transfer of Care (Signed)
Immediate Anesthesia Transfer of Care Note  Patient: Tina [name]  Procedure(s) Performed: Procedure(s) (LRB) with comments: ARTHROPLASTY BIPOLAR HIP (Left)  Patient Location: PACU  Anesthesia Type:General  Level of Consciousness: awake, alert , oriented and patient cooperative  Airway & Oxygen Therapy: Patient Spontanous Breathing and Patient connected to face mask oxygen  Post-op Assessment: Report given to PACU RN, Post -op Vital signs reviewed and stable and Patient moving all extremities  Post vital signs: Reviewed and stable  Complications: No apparent anesthesia complications

## 2012-08-27 NOTE — OR Nursing (Signed)
Maintained on droplet precautions

## 2012-08-27 NOTE — Progress Notes (Signed)
56 y/o female with hx of asthma, remote hx of a. Fib, GERD, sleep apnea and diabetes; came to the hospital after midnight after been sick for more than 1 week with respiratory symptoms. Patient reports difficulty breathing, intermittent fever, cough, general malaise and decrease PO intake (fluids and solids). While patient was waiting in registration to be seen she pass out and during the fall adquire a femoral neck fracture. Please referred to H&P dictated by Dr. Joneen Roach for further details on admission.  At this moment on my exam patient is doing better and just complaining of mild pain on her left Hip; reports breathing is better. But still spiked fever overnight.   Plan: -observe on telemetry to make there is not further arrhythmias causing her to pass out -continue nebulizer tx, prednisone and antibiotics -IVF's resuscitation -Femoral fracture repair per ortho -follow flu test; patient more than 1 week of symptoms; at this point will only treat with tamiflu if flu test is positive. -Droplet precautions while waiting flu results. -further plan of care as per A/P on Dr. Lajoyce Lauber note.  Canna Nickelson [phone]

## 2012-08-27 NOTE — Consult Note (Signed)
Reason for Consult:   Left hip fracture Referring Physician:    Ruther Ephraim is an 56 y.o. female  Who has had respiratory symptoms for a week and was at [facility] near where she lives yesterday getting a CXR when she slipped and fell and could not get up.  Noted to have displaced left femoral neck fracture.  [facility] apparently has no operative orthopedists and she was transferred in via [facility].  Admitted to medicine for treatment of her flu ORS is consulted about the hip.  Denies pain elsewhere.    Past Medical History  Diagnosis Date  . Asthma   . Hyperlipidemia   . GERD (gastroesophageal reflux disease)   . Scoliosis   . Flu   . Atrial fibrillation   . Sleep apnea     "have a mask but I don't wear it" (08/27/2012)  . Type II diabetes mellitus     "borderline" (08/27/2012)  . Arthritis     "joints; fingers; shoulders; knees; back" (08/27/2012)  . Chronic back pain   . Fibromyalgia     "dx'd long time ago; before they really knew what it was" (112/02/2012)  . Syncope and collapse 08/26/2012    "broke left hip" (08/27/2012)    Past Surgical History  Procedure Date  . No past surgeries     Family History  Problem Relation Age of Onset  . Breast cancer    . Throat cancer    . Lung cancer      Social History:  reports that she has been smoking Cigarettes.  She has a 18 pack-year smoking history. She has never used smokeless tobacco. She reports that she does not drink alcohol or use illicit drugs.On disability due to back issues mostly.  Allergies: No Known Allergies  Medications: I have reviewed the patient's current medications.  Results for orders placed during the hospital encounter of 08/26/12 (from the past 48 hour(s))  CBC WITH DIFFERENTIAL     Status: Abnormal   Collection Time   08/27/12 12:23 AM      Component Value Range Comment   WBC 5.3  4.0 - 10.5 K/uL    RBC 4.17  3.87 - 5.11 MIL/uL    Hemoglobin 13.2  12.0 - 15.0 g/dL    HCT  16.1  09.6 - 04.5 %    MCV 94.7  78.0 - 100.0 fL    MCH 31.7  26.0 - 34.0 pg    MCHC 33.4  30.0 - 36.0 g/dL    RDW 40.9  81.1 - 91.4 %    Platelets 129 (*) 150 - 400 K/uL    Neutrophils Relative 66  43 - 77 %    Neutro Abs 3.5  1.7 - 7.7 K/uL    Lymphocytes Relative 21  12 - 46 %    Lymphs Abs 1.1  0.7 - 4.0 K/uL    Monocytes Relative 13 (*) 3 - 12 %    Monocytes Absolute 0.7  0.1 - 1.0 K/uL    Eosinophils Relative 0  0 - 5 %    Eosinophils Absolute 0.0  0.0 - 0.7 K/uL    Basophils Relative 0  0 - 1 %    Basophils Absolute 0.0  0.0 - 0.1 K/uL   PROTIME-INR     Status: Normal   Collection Time   08/27/12 12:23 AM      Component Value Range Comment   Prothrombin Time 13.5  11.6 - 15.2 seconds  INR 1.04  0.00 - 1.49   TROPONIN I     Status: Normal   Collection Time   08/27/12 12:23 AM      Component Value Range Comment   Troponin I <0.30  <0.30 ng/mL   TYPE AND SCREEN     Status: Normal   Collection Time   08/27/12 12:30 AM      Component Value Range Comment   ABO/RH(D) O POS      Antibody Screen NEG      Sample Expiration 08/30/2012     ABO/RH     Status: Normal   Collection Time   08/27/12 12:30 AM      Component Value Range Comment   ABO/RH(D) O POS     POCT I-STAT, CHEM 8     Status: Abnormal   Collection Time   08/27/12  1:07 AM      Component Value Range Comment   Sodium 138  135 - 145 mEq/L    Potassium 3.8  3.5 - 5.1 mEq/L    Chloride 104  96 - 112 mEq/L    BUN 10  6 - 23 mg/dL    Creatinine, Ser 5.28  0.50 - 1.10 mg/dL    Glucose, Bld 413 (*) 70 - 99 mg/dL    Calcium, Ion 2.44 (*) 1.12 - 1.23 mmol/L    TCO2 23  0 - 100 mmol/L    Hemoglobin 13.9  12.0 - 15.0 g/dL    HCT 01.0  27.2 - 53.6 %   GLUCOSE, CAPILLARY     Status: Abnormal   Collection Time   08/27/12  4:10 AM      Component Value Range Comment   Glucose-Capillary 111 (*) 70 - 99 mg/dL   CBC     Status: Abnormal   Collection Time   08/27/12  6:05 AM      Component Value Range Comment   WBC 5.8   4.0 - 10.5 K/uL    RBC 3.93  3.87 - 5.11 MIL/uL    Hemoglobin 12.4  12.0 - 15.0 g/dL    HCT 64.4  03.4 - 74.2 %    MCV 94.7  78.0 - 100.0 fL    MCH 31.6  26.0 - 34.0 pg    MCHC 33.3  30.0 - 36.0 g/dL    RDW 59.5  63.8 - 75.6 %    Platelets 121 (*) 150 - 400 K/uL   BASIC METABOLIC PANEL     Status: Abnormal   Collection Time   08/27/12  6:05 AM      Component Value Range Comment   Sodium 140  135 - 145 mEq/L    Potassium 3.9  3.5 - 5.1 mEq/L    Chloride 105  96 - 112 mEq/L    CO2 23  19 - 32 mEq/L    Glucose, Bld 111 (*) 70 - 99 mg/dL    BUN 11  6 - 23 mg/dL    Creatinine, Ser 4.33  0.50 - 1.10 mg/dL    Calcium 8.5  8.4 - 29.5 mg/dL    GFR calc non Af Amer 81 (*) >90 mL/min    GFR calc Af Amer >90  >90 mL/min     Dg Chest 2 View  08/27/2012  *RADIOLOGY REPORT*  Clinical Data: Syncope.  Left hip fracture. Pre-op respiratory exam.  CHEST - 2 VIEW  Comparison:  08/26/2012  Findings:  The heart size and mediastinal contours are within normal limits.  Both lungs  are clear.  The visualized skeletal structures are unremarkable.  IMPRESSION: No active cardiopulmonary disease.   Original Report Authenticated By: Myles Rosenthal, M.D.    Dg Knee 1-2 Views Left  08/27/2012  *RADIOLOGY REPORT*  Clinical Data: Status post fall; left knee pain.  LEFT KNEE - 1-2 VIEW  Comparison: None.  Findings: There is no evidence of fracture or dislocation.  The joint spaces are preserved.  No significant degenerative change is seen; the patellofemoral joint is grossly unremarkable in appearance.  A small enthesophyte is noted at the superior pole of the patella.  No significant joint effusion is seen.  The visualized soft tissues are normal in appearance.  IMPRESSION: No evidence of fracture or dislocation.   Original Report Authenticated By: Tonia Ghent, M.D.    HIP FIL[facility] from [facility] on EPIC show displaced femoral neck fracture with no sign of tumor or DJD  @ROS @ Blood pressure 137/58, pulse 103,  temperature 100.2 F (37.9 C), temperature source Oral, resp. rate 18, height 5\' 6"  (1.676 m), weight 72.439 kg (159 lb 11.2 oz), SpO2 95.00%.  PHYSICAL EXAM:   ABD soft Neurovascular intact Sensation intact distally Intact pulses distally Dorsiflexion/Plantar flexion intact Compartment soft Left leg is shortened and externally rotated Both arms and other leg move fully and head is atraumatic  ASSESSMENT:   Left displaced femoral neck fracture  PLAN:   Needs hemiarthroplasty to walk again.  Discussed risks and benefits in detail.  Would like to go forward later today and I will get her on schedule for myself or one of my partners.   Kyle Luppino G 08/27/2012, 7:48 AM

## 2012-08-27 NOTE — Brief Op Note (Signed)
GISSELLA NIBLACK [id number] 08/27/2012   PRE-OP DIAGNOSIS: left hip fracture  POST-OP DIAGNOSIS: same  PROCEDURE: left hip hemiarthroplasty  ANESTHESIA: general  Donneisha Beane G   Dictation #:  [id number]

## 2012-08-27 NOTE — Anesthesia Postprocedure Evaluation (Signed)
  Anesthesia Post-op Note  Patient: Tina [name]  Procedure(s) Performed: Procedure(s) (LRB) with comments: ARTHROPLASTY BIPOLAR HIP (Left)  Patient Location: PACU  Anesthesia Type:General  Level of Consciousness: awake  Airway and Oxygen Therapy: Patient Spontanous Breathing  Post-op Pain: mild  Post-op Assessment: Post-op Vital signs reviewed  Post-op Vital Signs: stable  Complications: No apparent anesthesia complications

## 2012-08-28 ENCOUNTER — Inpatient Hospital Stay (HOSPITAL_COMMUNITY): Payer: Medicaid Other

## 2012-08-28 DIAGNOSIS — J189 Pneumonia, unspecified organism: Secondary | ICD-10-CM

## 2012-08-28 DIAGNOSIS — F17200 Nicotine dependence, unspecified, uncomplicated: Secondary | ICD-10-CM

## 2012-08-28 LAB — TROPONIN I: Troponin I: <0.3 ng/mL (ref <0.30)

## 2012-08-28 LAB — GLUCOSE, CAPILLARY: Glucose-Capillary: 167 mg/dL — ABNORMAL HIGH (ref 70–99)

## 2012-08-28 LAB — LIPID PANEL: LDL Cholesterol: 42 mg/dL (ref 0–99)

## 2012-08-28 MED ORDER — ACETAMINOPHEN 650 MG RE SUPP: 650.0000 mg | Freq: Four times a day (QID) | RECTAL | Status: DC | PRN

## 2012-08-28 MED ORDER — SODIUM CHLORIDE 0.9 % IV SOLN
INTRAVENOUS | Status: DC
  Administered 2012-08-28: 50 mL via INTRAVENOUS
  Administered 2012-08-29: 09:00:00 via INTRAVENOUS
  Administered 2012-08-29: 20 mL via INTRAVENOUS

## 2012-08-28 MED ORDER — NON FORMULARY: 50.0000 mg | Freq: Every day | Status: DC

## 2012-08-28 MED ORDER — ACETAMINOPHEN 325 MG PO TABS
650.0000 mg | ORAL_TABLET | ORAL | Status: DC | PRN
  Administered 2012-08-28 – 2012-08-29 (×4): 650 mg via ORAL
  Filled 2012-08-28 (×3): qty 2

## 2012-08-28 MED ORDER — PANTOPRAZOLE SODIUM 40 MG PO TBEC
40.0000 mg | DELAYED_RELEASE_TABLET | Freq: Two times a day (BID) | ORAL | Status: DC
  Administered 2012-08-28 – 2012-08-31 (×6): 40 mg via ORAL
  Filled 2012-08-28 (×5): qty 1

## 2012-08-28 MED ORDER — DESVENLAFAXINE SUCCINATE ER 50 MG PO TB24: 50.0000 mg | ORAL_TABLET | Freq: Every day | ORAL | Status: DC

## 2012-08-28 MED ORDER — PREDNISONE 20 MG PO TABS
40.0000 mg | ORAL_TABLET | Freq: Every day | ORAL | Status: DC
  Administered 2012-08-29: 40 mg via ORAL
  Filled 2012-08-28 (×2): qty 2

## 2012-08-28 NOTE — Op Note (Signed)
[name], [name]            ACCOUNT NO.:  [id number]  MEDICAL RECORD NO.:  [id number]  LOCATION:  [facility]                         FACILITY:  [facility]  PHYSICIAN:  Tina Tina [name]. Tina Tina [name], M.D.DATE OF BIRTH:  1955-11-08  DATE OF PROCEDURE:  08/27/2012 DATE OF DISCHARGE:  08/26/2012                              OPERATIVE REPORT   PREOPERATIVE DIAGNOSIS:  Left femoral neck fracture.  POSTOP:  Left femoral neck fracture.  PROCEDURE:  Left hip hemiarthroplasty.  ANESTHESIA:  General.  ATTENDING SURGEON:  Tina Tina [name]. Tina Tina [name], M.D.  INDICATION FOR PROCEDURE:  The patient is Tina [name] 56 year old woman who has had Tina [name] recent history of respiratory illness.  She was in the radiology department yesterday getting Tina [name] chest x-ray when she apparently slipped and fell and broke her hip.  Orthopedics was consulted at this point about that problem.  She is offered hemiarthroplasty in hopes of stabilizing her hip so she can sit, stand, and walk again.  Informed operative consent was obtained after discussion of possible complications including reaction to anesthesia, infection, DVT, PE, and death.  SUMMARY OF FINDINGS AND PROCEDURE:  Under general anesthesia through Tina [name] posterior approach, Tina [name] left hip hemiarthroplasty was performed.  She had excellent bone quality, and we addressed her problem with an uncemented DePuy system using Tina [name] Summit 4 stem with Tina [name] +5, 44 unipolar assembly.  She was admitted back to the Medicine Service.  DESCRIPTION OF PROCEDURE:  The patient was taken to the operating suite where general anesthetic was applied without difficulty.  She was positioned in lateral decubitus position with left hip up.  Axillary roll was placed.  Bony prominences were padded.  Hip positioners were utilized.  She was then prepped and draped in the normal sterile fashion.  After administration of IV Kefzol, an appropriate time-out by Tina [name] posterior approach was taken to the left hip.  An incision was made with  dissection down to the IT band and gluteus maximus fascia.  These structures were split longitudinally to expose the short external rotators of the hip, which were tagged and reflected.  Tina [name] performed Tina [name] minimal posterior capsulectomy.  Tina [name] revision of femoral neck cut was made followed by removal of the femoral head, which sized to Tina [name] 44.  Tina [name] trial reduction was done with Tina [name] femoral head of that size, and seemed to fit well.  The femur was then reamed and broached up to Tina [name] size 4 in slight anteversion.  Tina [name] trial reduction was done with several head and neck assemblies and the +5 seemed to give [name] the best stability with Tina [name] standard neck.  She was stable in internal rotation with flexion and external rotation with extension.  Leg lengths were judged to be roughly equal.  She did have Tina [name] fairly valgus neck, and we tried to the equalize the length there.  The trial component was removed followed by thorough irrigation of the wound.  We then placed Tina [name] size 4 Summit stem in slight anteversion.  This seated fully.  We then capped it with Tina [name] +5, 44 unipolar assembly.  The hip was reduced and was quite stable.  Leg lengths were felt to be about equal.  The hip was thoroughly irrigated followed by reapproximation  of the capsule and short external rotators to the greater trochanter with Tina [name] nonabsorbable suture.  The wound was again irrigated followed by reapproximation of IT band and gluteus maximus fascia with #1 Vicryl.  Subcutaneous tissues were reapproximated with 0 and 2-0 undyed Vicryl followed by skin closure with clips.  Tina [name] sterile dressing was applied.  Estimated blood loss and fluids obtained from anesthesia records.  DISPOSITION:  The patient was extubated in the operating room and taken to recovery in stable condition.  She was to be admitted back to the Medicine Service for appropriate postop care and for management of her influenza.     Tina Tina [name] Tina Tina [name], M.D.     PGD/MEDQ  D:  08/27/2012   T:  08/28/2012  Job:  [id number]

## 2012-08-28 NOTE — Progress Notes (Signed)
[facility] PROGRESS NOTE  BEVELY HACKBART ZOX:[id number] DOB: 10-May-1956 DOA: 08/26/2012 PCP: Sheila Oats, MD  Assessment/Plan: 1-Femoral fracture: per ortho. S/P left hip arthroplasty. Pain is well controlled. And per PT evaluation, ok to go home with HHPT.  2-Bronchitis/early PNA: continue levaquin. Patient spike fever overnight. But overall improving.  3-Tobacco abuse: counseling provided.  4-Diabetes: continue metformin and SSI  5-PAF (paroxysmal atrial fibrillation): continue acebutolol; rate controlled. No abnormalities on telemetry.  6-asthma/COPD exacerbation: secondary to infection most likely. -continue prednisone -continue abx's -continue nebulizer  7-Chest discomfort: troponin negative; EKG w/o ischemic changes. Most likely worsening of GERD with prednisone therapy.  8-GERD (gastroesophageal reflux disease): continue carafate and PPI BID  Code Status: full Family Communication: no family at bedside Disposition Plan: home with [facility] services when medically stable   Consultants:  ortho  Procedures:  Left hip arthroplasty  Antibiotics:  levaquin  HPI/Subjective: Patient reports feeling better, and breathing better. Overnight spike fever and also experienced chest discomfort  Objective: Filed Vitals:   08/28/12 0100 08/28/12 0412 08/28/12 0500 08/28/12 1000  BP:  124/73 118/71 137/72  Pulse:  97  98  Temp: 100.5 F (38.1 C) 102.3 F (39.1 C) 101.9 F (38.8 C) 99.4 F (37.4 C)  TempSrc:    Oral  Resp:  20 20 20   Height:      Weight:      SpO2:  93%  94%    Intake/Output Summary (Last 24 hours) at 08/28/12 1406 Last data filed at 08/28/12 0600  Gross per 24 hour  Intake   3700 ml  Output   2050 ml  Net   1650 ml   Filed Weights   08/27/12 0406  Weight: 72.439 kg (159 lb 11.2 oz)    Exam:   General:  NAD  Cardiovascular: S1 and S2, no rubs or gallops  Respiratory: scattered rhonchi, mild exp wheezing  Abdomen: soft,  NT, ND positive BS  Extremities: LLE with immobilizer in place, no edema or cyanosis  Neuro no focal  Data Reviewed: Basic Metabolic Panel:  Lab 08/27/12 8119 08/27/12 0107  NA 140 138  K 3.9 3.8  CL 105 104  CO2 23 --  GLUCOSE 111* 112*  BUN 11 10  CREATININE 0.80 0.90  CALCIUM 8.5 --  MG -- --  PHOS -- --   CBC:  Lab 08/27/12 0605 08/27/12 0107 08/27/12 0023  WBC 5.8 -- 5.3  NEUTROABS -- -- 3.5  HGB 12.4 13.9 13.2  HCT 37.2 41.0 39.5  MCV 94.7 -- 94.7  PLT 121* -- 129*   Cardiac Enzymes:  Lab 08/28/12 0605 08/27/12 0023  CKTOTAL -- --  CKMB -- --  CKMBINDEX -- --  TROPONINI <0.30 <0.30   CBG:  Lab 08/28/12 1131 08/28/12 0736 08/28/12 0409 08/27/12 2211 08/27/12 1636  GLUCAP 202* 126* 149* 220* 134*    No results found for this or any previous visit (from the past 240 hour(s)).   Studies: Dg Chest 2 View  08/27/2012  *RADIOLOGY REPORT*  Clinical Data: Syncope.  Left hip fracture. Pre-op respiratory exam.  CHEST - 2 VIEW  Comparison:  08/26/2012  Findings:  The heart size and mediastinal contours are within normal limits.  Both lungs are clear.  The visualized skeletal structures are unremarkable.  IMPRESSION: No active cardiopulmonary disease.   Original Report Authenticated By: Myles Rosenthal, M.D.    Dg Knee 1-2 Views Left  08/27/2012  *RADIOLOGY REPORT*  Clinical Data: Status post fall; left knee pain.  LEFT  KNEE - 1-2 VIEW  Comparison: None.  Findings: There is no evidence of fracture or dislocation.  The joint spaces are preserved.  No significant degenerative change is seen; the patellofemoral joint is grossly unremarkable in appearance.  A small enthesophyte is noted at the superior pole of the patella.  No significant joint effusion is seen.  The visualized soft tissues are normal in appearance.  IMPRESSION: No evidence of fracture or dislocation.   Original Report Authenticated By: Tonia Ghent, M.D.    Dg Pelvis Portable  08/28/2012  *RADIOLOGY REPORT*   Clinical Data: 56 year old female - status post left hip fracture and left hip hemiarthroplasty.  PORTABLE PELVIS  Comparison: 08/26/2012  Findings: Frontal views of the pelvis and left hip demonstrate left hip hemiarthroplasty without complicating features. There is no evidence of fracture, subluxation or dislocation. No complicating hardware features are identified. Soft tissue postoperative changes are present.  IMPRESSION: Left hip hemiarthroplasty without complicating features.   Original Report Authenticated By: Harmon Pier, M.D.     Scheduled Meds:   . acebutolol  400 mg Oral BID  . enoxaparin (LOVENOX) injection  40 mg Subcutaneous Q24H  . glipiZIDE  10 mg Oral Q breakfast  . insulin aspart  0-15 Units Subcutaneous Q6H  . levofloxacin  750 mg Oral Q0600  . metFORMIN  500 mg Oral BID WC  . nicotine  14 mg Transdermal Daily  . pantoprazole  40 mg Oral BID AC  . predniSONE  50 mg Oral Q breakfast  . sucralfate  1 g Oral QID  . [DISCONTINUED] pantoprazole  40 mg Oral Daily   Continuous Infusions:   . sodium chloride    . [DISCONTINUED] 0.9 % NaCl with KCl 20 mEq / L 125 mL/hr at 08/28/12 0904  . [DISCONTINUED] lactated ringers 20 mL/hr at 08/27/12 1251    Time spent: >30 minutes    Gregoria Selvy  [facility] Pager [phone]. If 8PM-8AM, please contact night-coverage at [email or web address], password [facility] 08/28/2012, 2:06 PM  LOS: 2 days

## 2012-08-28 NOTE — Progress Notes (Signed)
Pt resting in bed and developed CP 8/10. Pt stated pain radiated to her back. Pt denied any SOB. An EKG was obtained and pt received 2 sl nitro that relieved her CP. Nasal cannula 2l of O2 applied. VS stable. MD on call made aware. Will cont to monitor pt.

## 2012-08-28 NOTE — Progress Notes (Signed)
Pt's temp 102.3. Ice pack and cool wash cloth applied to pt's skin. Md on call made aware. New orders received. Will cont to monitor the pt.

## 2012-08-28 NOTE — Progress Notes (Signed)
Subjective: 1 Day Post-Op Procedure(s) (LRB): ARTHROPLASTY BIPOLAR HIP (Left) Patient reports pain as 4 on 0-10 scale.   Not out of bed yet.  Foley catheter in place.  Taking oral fluids without difficulty.  Objective: Vital signs in last 24 hours: Temp:  [97 F (36.1 C)-102.3 F (39.1 C)] 101.9 F (38.8 C) (12/07 0500) Pulse Rate:  [73-97] 97  (12/07 0412) Resp:  [16-21] 20  (12/07 0500) BP: (118-129)/(49-73) 118/71 mmHg (12/07 0500) SpO2:  [90 %-100 %] 93 % (12/07 0412)  Intake/Output from previous day: 12/06 0701 - 12/07 0700 In: 3700 [I.V.:2700] Out: 2050 [Urine:1900; Blood:150] Intake/Output this shift:     Basename 08/27/12 0605 08/27/12 0107 08/27/12 0023  HGB 12.4 13.9 13.2    Basename 08/27/12 0605 08/27/12 0107 08/27/12 0023  WBC 5.8 -- 5.3  RBC 3.93 -- 4.17  HCT 37.2 41.0 --  PLT 121* -- 129*    Basename 08/27/12 0605 08/27/12 0107  NA 140 138  K 3.9 3.8  CL 105 104  CO2 23 --  BUN 11 10  CREATININE 0.80 0.90  GLUCOSE 111* 112*  CALCIUM 8.5 --    Basename 08/27/12 0023  LABPT --  INR 1.04  left hip exam:  Neurovascular intact Sensation intact distally Dorsiflexion/Plantar flexion intact Incision: dressing C/D/I Compartment soft  Assessment/Plan: 1 Day Post-Op Procedure(s) (LRB): ARTHROPLASTY BIPOLAR HIP (Left) Plan: Up with therapy WBAT on left with posterior hip precautions. Will check hemoglobin. Lovenox for DVT prophylaxis.  Ulysse Siemen G 08/28/2012, 9:00 AM

## 2012-08-28 NOTE — Evaluation (Signed)
Physical Therapy Evaluation Patient Details Name: Tina [name] MRN: [id number] DOB: August 22, 1956 Today's Date: 08/28/2012 Time: 9147-8295 PT Time Calculation (min): 46 min  PT Assessment / Plan / Recommendation Clinical Impression  Pt. is a pleasant lady who was at [facility] for an outpatient xray for a respiratory illness when she passed out and fell, fracturing her left hip.  She has now undergone left hip hemiarthroplasty by Dr. Jerl Santos and she present to Pt with a decrease in her functional and mobility status following fracture and surgery.    She also has some generalized weakness from acute respiratory illness/flu x 1 week.  Will need acute PT to address these issues.  At this point, I am hopeful that she can progress to DC home with 24 hour care frrom family and friends, but she will need to improve her bed mobility signifiantly for this to happen.  In the event that she does not progress as anticipated, may need to consider ST SNF for rehab.      PT Assessment  Patient needs continued PT services    Follow Up Recommendations  Home health PT;Supervision/Assistance - 24 hour;Supervision for mobility/OOB    Does the patient have the potential to tolerate intense rehabilitation      Barriers to Discharge None as long as pt. progresses adequately    Equipment Recommendations  Rolling walker with 5" wheels;3 in 1 bedside comode    Recommendations for Other Services OT consult   Frequency Min 6X/week    Precautions / Restrictions Precautions Precautions: Posterior Hip;Fall Precaution Booklet Issued: Yes (comment) Precaution Comments: posterior hip precaution sheet posted in room  and discussed with pt. and her RN Bosie Clos Required Braces or Orthoses: Knee Immobilizer - Left (L KI in place; no orders pertaining to it's continued use) Knee Immobilizer - Left: Other (comment) (none indicated; will discuss with ortho) Restrictions Weight Bearing Restrictions: Yes LLE  Weight Bearing: Weight bearing as tolerated   Pertinent Vitals/Pain Pain in left hip 6/10, RN made aware and pt. Positioned for comfort after mobility      Mobility  Bed Mobility Bed Mobility: Supine to Sit;Sit to Supine Supine to Sit: 1: +2 Total assist;HOB elevated;With rails Supine to Sit: Patient Percentage: 40% Sit to Supine: Not Tested (comment) Details for Bed Mobility Assistance: Pt. needed max cueing for technique and to maintain posterior hip precautions Transfers Transfers: Sit to Stand;Stand to Sit Sit to Stand: 1: +2 Total assist;With upper extremity assist;From bed Sit to Stand: Patient Percentage: 50% Stand to Sit: 1: +2 Total assist;To chair/3-in-1;With armrests Stand to Sit: Patient Percentage: 50% Details for Transfer Assistance: Pt. needed step by step instructions for technique and for safety Ambulation/Gait Ambulation/Gait Assistance: 1: +2 Total assist Ambulation/Gait: Patient Percentage: 70% Ambulation Distance (Feet): 10 Feet Assistive device: Rolling walker Ambulation/Gait Assistance Details: Pt. again needed step by step instructions for sequence and technique.  Seh became cool and clammy but reports only mild dizziness. RN made aware. Gait Pattern: Step-through pattern;Decreased step length - left;Decreased hip/knee flexion - left Stairs: No Wheelchair Mobility Wheelchair Mobility: No    Shoulder Instructions     Exercises Total Joint Exercises Ankle Circles/Pumps: AROM;Both;10 reps;Supine Quad Sets: AROM;Left;10 reps;Supine   PT Diagnosis: Difficulty walking;Generalized weakness;Acute pain  PT Problem List: Decreased strength;Decreased activity tolerance;Decreased balance;Decreased mobility;Decreased knowledge of use of DME;Decreased knowledge of precautions;Pain PT Treatment Interventions: DME instruction;Gait training;Stair training;Functional mobility training;Therapeutic activities;Therapeutic exercise;Balance training;Patient/family education    PT Goals Acute Rehab PT Goals PT Goal Formulation: With  patient Time For Goal Achievement: 09/04/12 Potential to Achieve Goals: Good Pt will go Supine/Side to Sit: with min assist PT Goal: Supine/Side to Sit - Progress: Goal set today Pt will go Sit to Supine/Side: with min assist PT Goal: Sit to Supine/Side - Progress: Goal set today Pt will go Sit to Stand: with min assist PT Goal: Sit to Stand - Progress: Goal set today Pt will go Stand to Sit: with min assist PT Goal: Stand to Sit - Progress: Goal set today Pt will Transfer Bed to Chair/Chair to Bed: with min assist PT Transfer Goal: Bed to Chair/Chair to Bed - Progress: Goal set today Pt will Ambulate: 51 - 150 feet;with supervision;with rolling walker PT Goal: Ambulate - Progress: Goal set today Pt will Go Up / Down Stairs: 3-5 stairs;with min assist;with rolling walker PT Goal: Up/Down Stairs - Progress: Goal set today  Visit Information  Last PT Received On: 08/28/12 Assistance Needed: +2    Subjective Data  Subjective: "It happened Wednesday evening" Patient Stated Goal: doing everydaystuff like housework, cooking, going to store   Prior Functioning  Home Living Lives With: Shari Heritage (son works 2nd shift) Available Help at Discharge: Friend(s);Family;Available 24 hours/day;Other (Comment) (son, friend and sister can provide 24 hour per pt.) Type of Home: House Home Access: Stairs to enter Secretary/administrator of Steps: 3 Entrance Stairs-Rails: None Home Layout: One level Bathroom Shower/Tub: Forensic scientist: Standard Bathroom Accessibility: Yes How Accessible: Accessible via walker Home Adaptive Equipment: None Prior Function Level of Independence: Independent Able to Take Stairs?: Yes Driving: Yes Vocation:  (homemaker) Communication Communication: No difficulties Dominant Hand: Right    Cognition  Overall Cognitive Status: Appears within functional limits for tasks  assessed/performed Arousal/Alertness: Awake/alert Orientation Level: Oriented X4 / Intact Behavior During Session: WFL for tasks performed    Extremity/Trunk Assessment Right Upper Extremity Assessment RUE ROM/Strength/Tone: Within functional levels Left Upper Extremity Assessment LUE ROM/Strength/Tone: Within functional levels Right Lower Extremity Assessment RLE ROM/Strength/Tone: Within functional levels RLE Sensation: WFL - Light Touch RLE Coordination: WFL - gross/fine motor Left Lower Extremity Assessment LLE ROM/Strength/Tone: Deficits;Unable to fully assess;Due to pain;Due to precautions LLE ROM/Strength/Tone Deficits: good quad set and ankle pump LLE Sensation: WFL - Light Touch Trunk Assessment Trunk Assessment: Normal   Balance    End of Session PT - End of Session Equipment Utilized During Treatment: Gait belt Activity Tolerance: Patient tolerated treatment well;Patient limited by pain Patient left: in bed;with call bell/phone within reach;Other (comment) (xray present to take films after walk) Nurse Communication: Mobility status;Precautions;Weight bearing status;Other (comment) (need to get up later, following xray)  GP     Ferman Hamming 08/28/2012, 11:19 AM Weldon Picking PT Acute Rehab Services [phone] Beeper [phone]

## 2012-08-28 NOTE — Progress Notes (Signed)
Pt had temp of 101. PRN tylenol given. Pt temp came down to 100.5. Md on call made aware. Will cont to monitor pt.

## 2012-08-29 LAB — BASIC METABOLIC PANEL
BUN: 8 mg/dL (ref 6–23)
CO2: 23 mEq/L (ref 19–32)
Chloride: 107 mEq/L (ref 96–112)
GFR calc Af Amer: >90 mL/min (ref >90)
Potassium: 3.2 mEq/L — ABNORMAL LOW (ref 3.5–5.1)

## 2012-08-29 LAB — CBC
HCT: 30.7 % — ABNORMAL LOW (ref 36.0–46.0)
Platelets: 109 10*3/uL — ABNORMAL LOW (ref 150–400)
RBC: 3.32 MIL/uL — ABNORMAL LOW (ref 3.87–5.11)
RDW: 13.4 % (ref 11.5–15.5)
WBC: 7.6 10*3/uL (ref 4.0–10.5)

## 2012-08-29 LAB — GLUCOSE, CAPILLARY
Glucose-Capillary: 142 mg/dL — ABNORMAL HIGH (ref 70–99)
Glucose-Capillary: 67 mg/dL — ABNORMAL LOW (ref 70–99)
Glucose-Capillary: 89 mg/dL (ref 70–99)
Glucose-Capillary: 104 mg/dL — ABNORMAL HIGH (ref 70–99)
Glucose-Capillary: 235 mg/dL — ABNORMAL HIGH (ref 70–99)

## 2012-08-29 MED ORDER — ASPIRIN EC 325 MG PO TBEC: 325.0000 mg | DELAYED_RELEASE_TABLET | Freq: Two times a day (BID) | ORAL | Status: DC

## 2012-08-29 MED ORDER — INSULIN ASPART 100 UNIT/ML ~~LOC~~ SOLN
0.0000 [IU] | Freq: Three times a day (TID) | SUBCUTANEOUS | Status: DC
  Administered 2012-08-30: 2 [IU] via SUBCUTANEOUS
  Administered 2012-08-30: 11 [IU] via SUBCUTANEOUS
  Administered 2012-08-31: 2 [IU] via SUBCUTANEOUS

## 2012-08-29 MED ORDER — POTASSIUM CHLORIDE CRYS ER 20 MEQ PO TBCR
40.0000 meq | EXTENDED_RELEASE_TABLET | ORAL | Status: AC
  Administered 2012-08-29 (×3): 40 meq via ORAL
  Filled 2012-08-29 (×3): qty 2

## 2012-08-29 MED ORDER — PREDNISONE 20 MG PO TABS
30.0000 mg | ORAL_TABLET | Freq: Every day | ORAL | Status: DC
  Administered 2012-08-30 – 2012-08-31 (×2): 30 mg via ORAL
  Filled 2012-08-29 (×3): qty 1

## 2012-08-29 MED ORDER — INSULIN ASPART 100 UNIT/ML ~~LOC~~ SOLN
0.0000 [IU] | Freq: Every day | SUBCUTANEOUS | Status: DC
  Administered 2012-08-29: 2 [IU] via SUBCUTANEOUS

## 2012-08-29 MED ORDER — DESVENLAFAXINE SUCCINATE ER 50 MG PO TB24
50.0000 mg | ORAL_TABLET | Freq: Every day | ORAL | Status: DC
  Administered 2012-08-29 – 2012-08-31 (×3): 50 mg via ORAL
  Filled 2012-08-29 (×3): qty 1

## 2012-08-29 MED ORDER — GLIPIZIDE ER 5 MG PO TB24
5.0000 mg | ORAL_TABLET | Freq: Every day | ORAL | Status: DC
  Administered 2012-08-30 – 2012-08-31 (×2): 5 mg via ORAL
  Filled 2012-08-29 (×3): qty 1

## 2012-08-29 MED ORDER — OXYCODONE-ACETAMINOPHEN 5-325 MG PO TABS: 1.0000 | ORAL_TABLET | Freq: Four times a day (QID) | ORAL | Status: DC | PRN

## 2012-08-29 NOTE — Progress Notes (Signed)
[facility] PROGRESS NOTE  Tina [name] XBJ:[id number] DOB: 07-26-1956 DOA: 08/26/2012 PCP: Sheila Oats, MD  Assessment/Plan: 1-Femoral fracture: per ortho. S/P left hip arthroplasty. Pain is well controlled. And per PT evaluation, ok to go home with HHPT if patient improved mobility; will follow clinical evolution.  2-Bronchitis/early PNA: continue levaquin. Patient now w/o fever and normal WBC's. Will check CXR in am. Day 4/10 of antibiotics.  3-Tobacco abuse: counseling provided.  4-Diabetes mellitus Type 2: controlled. A1C 6.5; mild hypoglycemia this morning, will adjust glipizide dose; continue  continue metformin and SSI  5-PAF (paroxysmal atrial fibrillation): continue acebutolol; rate controlled. No abnormalities on telemetry. No CP  6-asthma/COPD exacerbation: secondary to infection most likely. -continue tapering prednisone -continue abx's (4/8) -continue nebulizer tx  7-Chest discomfort: troponin negative; EKG w/o ischemic changes. Most likely worsening of GERD with prednisone therapy. No further CP. Continue PPI  8-GERD (gastroesophageal reflux disease): continue carafate and PPI BID. No CP or any reflux symptoms this morning  Code Status: full Family Communication: no family at bedside Disposition Plan: home with [facility] services when medically stable vs ST SNF   Consultants:  ortho  Procedures:  Left hip arthroplasty  Antibiotics:  levaquin  HPI/Subjective: Patient reports feeling better, and breathing a lot better. No fever and denies any CP, abdominal pain, nausea, vomiting or any other complaints. Overnight mild hypoglycemic event  Objective: Filed Vitals:   08/28/12 2220 08/29/12 0013 08/29/12 0401 08/29/12 1030  BP:  126/70 138/72 121/70  Pulse:  91 93 89  Temp: 99.5 F (37.5 C) 100.1 F (37.8 C) 99.9 F (37.7 C) 98.7 F (37.1 C)  TempSrc:    Oral  Resp:  16 16 18   Height:      Weight:      SpO2:  96% 94% 97%     Intake/Output Summary (Last 24 hours) at 08/29/12 1105 Last data filed at 08/29/12 0630  Gross per 24 hour  Intake    480 ml  Output   4300 ml  Net  -3820 ml   Filed Weights   08/27/12 0406  Weight: 72.439 kg (159 lb 11.2 oz)    Exam:   General:  NAD  Cardiovascular: S1 and S2, no rubs or gallops  Respiratory: scattered rhonchi, mild exp wheezing  Abdomen: soft, NT, ND positive BS  Extremities: LLE with some pain with movement, no edema or cyanosis; clean wound  Neuro no focal  Data Reviewed: Basic Metabolic Panel:  Lab 08/29/12 3086 08/27/12 0605 08/27/12 0107  NA 139 140 138  K 3.2* 3.9 3.8  CL 107 105 104  CO2 23 23 --  GLUCOSE 90 111* 112*  BUN 8 11 10   CREATININE 0.71 0.80 0.90  CALCIUM 8.4 8.5 --  MG -- -- --  PHOS -- -- --   CBC:  Lab 08/29/12 0645 08/27/12 0605 08/27/12 0107 08/27/12 0023  WBC 7.6 5.8 -- 5.3  NEUTROABS -- -- -- 3.5  HGB 10.5* 12.4 13.9 13.2  HCT 30.7* 37.2 41.0 39.5  MCV 92.5 94.7 -- 94.7  PLT 109* 121* -- 129*   Cardiac Enzymes:  Lab 08/28/12 1820 08/28/12 1304 08/28/12 0605 08/27/12 0023  CKTOTAL -- -- -- --  CKMB -- -- -- --  CKMBINDEX -- -- -- --  TROPONINI <0.30 <0.30 <0.30 <0.30   CBG:  Lab 08/29/12 0433 08/29/12 0358 08/29/12 0013 08/28/12 2040 08/28/12 1640  GLUCAP 89 67* 93 142* 167*    Studies: Dg Pelvis Portable  08/28/2012  *RADIOLOGY REPORT*  Clinical Data: 56 year old female - status post left hip fracture and left hip hemiarthroplasty.  PORTABLE PELVIS  Comparison: 08/26/2012  Findings: Frontal views of the pelvis and left hip demonstrate left hip hemiarthroplasty without complicating features. There is no evidence of fracture, subluxation or dislocation. No complicating hardware features are identified. Soft tissue postoperative changes are present.  IMPRESSION: Left hip hemiarthroplasty without complicating features.   Original Report Authenticated By: Harmon Pier, M.D.     Scheduled Meds:    .  acebutolol  400 mg Oral BID  . desvenlafaxine  50 mg Oral Daily  . enoxaparin (LOVENOX) injection  40 mg Subcutaneous Q24H  . glipiZIDE  10 mg Oral Q breakfast  . insulin aspart  0-15 Units Subcutaneous Q6H  . levofloxacin  750 mg Oral Q0600  . metFORMIN  500 mg Oral BID WC  . nicotine  14 mg Transdermal Daily  . pantoprazole  40 mg Oral BID AC  . potassium chloride  40 mEq Oral Q4H  . predniSONE  30 mg Oral Q breakfast  . sucralfate  1 g Oral QID  . [DISCONTINUED] desvenlafaxine  50 mg Oral Daily  . [DISCONTINUED] NON FORMULARY 50 mg  50 mg Oral Daily  . [DISCONTINUED] NON FORMULARY 50 mg  50 mg Oral Daily  . [DISCONTINUED] pantoprazole  40 mg Oral Daily  . [DISCONTINUED] predniSONE  40 mg Oral Q breakfast  . [DISCONTINUED] predniSONE  50 mg Oral Q breakfast   Continuous Infusions:    . sodium chloride 50 mL/hr at 08/29/12 0925  . [DISCONTINUED] 0.9 % NaCl with KCl 20 mEq / L 125 mL/hr at 08/28/12 0904  . [DISCONTINUED] lactated ringers 20 mL/hr at 08/27/12 1251    Time spent: >30 minutes    Lenward Able  [facility] Pager [phone]. If 8PM-8AM, please contact night-coverage at [email or web address], password [facility] 08/29/2012, 11:05 AM  LOS: 3 days

## 2012-08-29 NOTE — Progress Notes (Signed)
Hypoglycemic Event  CBG: 67   Treatment: 15 GM carbohydrate snack  Symptoms: None  Follow-up CBG: [id number] CBG Result  89  Possible Reasons for Event: Unknown  Comments/MD notified:    Delene Loll  Remember to initiate Hypoglycemia Order Set & complete

## 2012-08-29 NOTE — Progress Notes (Signed)
Physical Therapy Treatment Patient Details Name: Tina [name] MRN: [id number] DOB: 07-26-1956 Today's Date: 08/29/2012 Time: 8295-6213 PT Time Calculation (min): 23 min  PT Assessment / Plan / Recommendation Comments on Treatment Session  Pt progressing well although still limited with weight bearing in LLE. Continue per plan, aiming for HHPT with progression    Follow Up Recommendations  Home health PT;Supervision/Assistance - 24 hour;Supervision for mobility/OOB     Does the patient have the potential to tolerate intense rehabilitation     Barriers to Discharge        Equipment Recommendations  Rolling walker with 5" wheels;3 in 1 bedside comode    Recommendations for Other Services OT consult  Frequency Min 6X/week   Plan Discharge plan remains appropriate;Frequency remains appropriate    Precautions / Restrictions Precautions Precautions: Posterior Hip;Fall Precaution Comments: Pt able to recall 1 hip precaution. Pt reeducated on hip precautions Required Braces or Orthoses: Knee Immobilizer - Left Restrictions Weight Bearing Restrictions: Yes LLE Weight Bearing: Weight bearing as tolerated   Pertinent Vitals/Pain Pain in hip 4/10.    Mobility  Bed Mobility Bed Mobility: Supine to Sit;Sitting - Scoot to Edge of Bed Supine to Sit: 1: +2 Total assist;HOB elevated;With rails Supine to Sit: Patient Percentage: 50% Sitting - Scoot to Edge of Bed: 3: Mod assist Details for Bed Mobility Assistance: VC for proper sequencing to maintain hip precautions during transfer. Assist with LLE Transfers Transfers: Sit to Stand;Stand to Sit Sit to Stand: With upper extremity assist;From bed;4: Min assist Stand to Sit: With upper extremity assist;3: Mod assist;To chair/3-in-1 Details for Transfer Assistance: Assist for control into sitting. VC for hand placement and proper techinque with LLE to maintain hip precautions during both sit and stand Ambulation/Gait Ambulation/Gait  Assistance: 4: Min assist Ambulation Distance (Feet): 20 Feet Assistive device: Rolling walker Ambulation/Gait Assistance Details: Cue for proper technique with RW as well as proper gait pattern for safety. Pt with decreased weightbearing through LLE Gait Pattern: Step-through pattern;Decreased step length - left;Decreased hip/knee flexion - left;Decreased weight shift to left;Narrow base of support Gait velocity: decreased gait speed Stairs: No    Exercises Total Joint Exercises Quad Sets: AROM;Left;10 reps;Supine Gluteal Sets: AROM;Strengthening;Both;10 reps;Supine Heel Slides: AAROM;Strengthening;Left;10 reps;Supine Hip ABduction/ADduction: Strengthening;Left;10 reps;Supine;AAROM Straight Leg Raises: AAROM;Strengthening;Left;10 reps;Supine   PT Diagnosis:    PT Problem List:   PT Treatment Interventions:     PT Goals Acute Rehab PT Goals PT Goal: Supine/Side to Sit - Progress: Progressing toward goal PT Goal: Sit to Stand - Progress: Progressing toward goal PT Goal: Stand to Sit - Progress: Progressing toward goal PT Transfer Goal: Bed to Chair/Chair to Bed - Progress: Progressing toward goal PT Goal: Ambulate - Progress: Progressing toward goal  Visit Information  Last PT Received On: 08/29/12 Assistance Needed: +2    Subjective Data      Cognition  Overall Cognitive Status: Appears within functional limits for tasks assessed/performed Arousal/Alertness: Awake/alert Orientation Level: Oriented X4 / Intact Behavior During Session: [facility] for tasks performed    Balance     End of Session PT - End of Session Equipment Utilized During Treatment: Gait belt Activity Tolerance: Patient tolerated treatment well;Patient limited by pain Patient left: in bed;with call bell/phone within reach;Other (comment) Nurse Communication: Mobility status;Precautions;Weight bearing status;Other (comment)   GP     Milana Kidney 08/29/2012, 2:30 PM

## 2012-08-29 NOTE — Progress Notes (Addendum)
Subjective: 2 Days Post-Op Procedure(s) (LRB): ARTHROPLASTY BIPOLAR HIP (Left) Patient reports pain as 2 on 0-10 scale.    Objective: Vital signs in last 24 hours: Temp:  [98.7 F (37.1 C)-100.9 F (38.3 C)] 98.7 F (37.1 C) (12/08 1030) Pulse Rate:  [89-101] 89  (12/08 1030) Resp:  [16-18] 18  (12/08 1030) BP: (112-138)/(66-75) 121/70 mmHg (12/08 1030) SpO2:  [94 %-97 %] 97 % (12/08 1030)  Intake/Output from previous day: 12/07 0701 - 12/08 0700 In: 720 [P.O.:720] Out: 4300 [Urine:4300] Intake/Output this shift:     Basename 08/29/12 0645 08/27/12 0605 08/27/12 0107 08/27/12 0023  HGB 10.5* 12.4 13.9 13.2    Basename 08/29/12 0645 08/27/12 0605  WBC 7.6 5.8  RBC 3.32* 3.93  HCT 30.7* 37.2  PLT 109* 121*    Basename 08/29/12 0645 08/27/12 0605  NA 139 140  K 3.2* 3.9  CL 107 105  CO2 23 23  BUN 8 11  CREATININE 0.71 0.80  GLUCOSE 90 111*  CALCIUM 8.4 8.5    Basename 08/27/12 0023  LABPT --  INR 1.04  RADIOLOGY REPORT*  Clinical Data: 56 year old female - status post left hip fracture  and left hip hemiarthroplasty.  PORTABLE PELVIS  Comparison: 08/26/2012  Findings: Frontal views of the pelvis and left hip demonstrate left  hip hemiarthroplasty without complicating features.  There is no evidence of fracture, subluxation or dislocation.  No complicating hardware features are identified.  Soft tissue postoperative changes are present.  IMPRESSION:  Left hip hemiarthroplasty without complicating features.  Original Report Authenticated By: Harmon Pier, M.D. Left hip:  Neurovascular intact Sensation intact distally Intact pulses distally Dorsiflexion/Plantar flexion intact Incision: no drainage Compartment soft  Assessment/Plan: 2 Days Post-Op Procedure(s) (LRB): ARTHROPLASTY BIPOLAR HIP (Left) Plan: Up with therapy  OT consult. WBAT on Left. Posterior hip precautions. ASA 325 mg BID x 3 weeks for DVT prophylaxis once leaves hospital. Percocet  5 mg Prn pain. Home vs SNF.  OK for discharge from ortho viewpoint. Will see while in hosp. F/U Dr Jerl Santos in 2 weeks. Izrael Peak G 08/29/2012, 11:26 AM

## 2012-08-30 ENCOUNTER — Inpatient Hospital Stay (HOSPITAL_COMMUNITY): Payer: Medicaid Other

## 2012-08-30 LAB — CBC
Hemoglobin: 10.5 g/dL — ABNORMAL LOW (ref 12.0–15.0)
MCH: 31.6 pg (ref 26.0–34.0)
MCV: 92.5 fL (ref 78.0–100.0)
Platelets: 141 10*3/uL — ABNORMAL LOW (ref 150–400)
RBC: 3.32 MIL/uL — ABNORMAL LOW (ref 3.87–5.11)
WBC: 9.2 10*3/uL (ref 4.0–10.5)

## 2012-08-30 LAB — BASIC METABOLIC PANEL
CO2: 23 mEq/L (ref 19–32)
Calcium: 8.9 mg/dL (ref 8.4–10.5)
Chloride: 105 mEq/L (ref 96–112)
Creatinine, Ser: 0.73 mg/dL (ref 0.50–1.10)
Glucose, Bld: 69 mg/dL — ABNORMAL LOW (ref 70–99)
Sodium: 138 mEq/L (ref 135–145)

## 2012-08-30 LAB — GLUCOSE, CAPILLARY
Glucose-Capillary: 55 mg/dL — ABNORMAL LOW (ref 70–99)
Glucose-Capillary: 80 mg/dL (ref 70–99)
Glucose-Capillary: 132 mg/dL — ABNORMAL HIGH (ref 70–99)

## 2012-08-30 MED FILL — Hydromorphone HCl Inj 1 MG/ML: INTRAMUSCULAR | Qty: 1 | Status: AC

## 2012-08-30 NOTE — Progress Notes (Signed)
UR Completed Dorthula Bier Graves-Bigelow, RN,BSN [phone]  

## 2012-08-30 NOTE — Progress Notes (Signed)
Physical Therapy Treatment Patient Details Name: Tina [name] MRN: [id number] DOB: Aug 21, 1956 Today's Date: 08/30/2012 Time: 4098-1191 PT Time Calculation (min): 33 min  PT Assessment / Plan / Recommendation Comments on Treatment Session  Concern she may not have 24 hour assist at home. After discussion about safety pt more agreeable to ST-SNF. This appears to be the safest plan today. Some motor planning difficulties today with regards to negotiation of RW and obstacles.     Follow Up Recommendations  SNF     Does the patient have the potential to tolerate intense rehabilitation     Barriers to Discharge        Equipment Recommendations  Rolling walker with 5" wheels;3 in 1 bedside comode    Recommendations for Other Services    Frequency Min 6X/week   Plan Discharge plan remains appropriate;Frequency remains appropriate    Precautions / Restrictions Precautions Precautions: Fall;Posterior Hip Precaution Booklet Issued: Yes (comment) Restrictions Weight Bearing Restrictions: No LLE Weight Bearing: Weight bearing as tolerated   Pertinent Vitals/Pain No major complaints, mild complaints of hip pain    Mobility  Bed Mobility Bed Mobility: Supine to Sit Supine to Sit: 4: Min guard Sitting - Scoot to Edge of Bed: 5: Supervision (cues to not bend over) Details for Bed Mobility Assistance: pt attempted to get up initially with poor maintainence of posterior hip precautions, with step by step verbal sequencing cues pt able to perform bed mobility mingaurdA while maintaining hip precautions Transfers Transfers: Sit to Stand;Stand to Sit Sit to Stand: 4: Min guard;With upper extremity assist;From bed;From chair/3-in-1;With armrests Stand to Sit: With upper extremity assist;To chair/3-in-1;With armrests Details for Transfer Assistance: cues for safe technique to maintain hip precautions during transfer Ambulation/Gait Ambulation/Gait Assistance: 5: Supervision Ambulation  Distance (Feet): 45 Feet Assistive device: Rolling walker Ambulation/Gait Assistance Details: cues for tall posture and sequencing cues to plan and negotiate obstacles (often walks right into objects with RW with poor anticipation movement to avoid obstacles); cues for small steps during turning to maintain hip precautions Gait Pattern: Decreased step length - left;Decreased hip/knee flexion - left;Decreased weight shift to left;Decreased stance time - left;Step-to pattern Stairs: Yes Stairs Assistance: 4: Min assist Stairs Assistance Details (indicate cue type and reason): step by step cues for technique with minA for stability of RW and gaurding of pt for safety Stair Management Technique: Backwards;With walker Number of Stairs: 2          PT Goals Acute Rehab PT Goals Pt will go Supine/Side to Sit: with modified independence PT Goal: Supine/Side to Sit - Progress: Updated due to goal met Pt will go Sit to Stand: with modified independence;with upper extremity assist PT Goal: Sit to Stand - Progress: Updated due to goal met Pt will go Stand to Sit: with modified independence;with upper extremity assist PT Goal: Stand to Sit - Progress: Updated due to goals met Pt will Transfer Bed to Chair/Chair to Bed: with modified independence PT Transfer Goal: Bed to Chair/Chair to Bed - Progress: Updated due to goal met PT Goal: Ambulate - Progress: Progressing toward goal PT Goal: Up/Down Stairs - Progress: Progressing toward goal  Visit Information  Last PT Received On: 08/30/12 Assistance Needed: +1    Subjective Data  Subjective: How long would I have to stay there if I went to a facility?   Cognition  Overall Cognitive Status: Impaired Area of Impairment: Problem solving;Attention Arousal/Alertness: Awake/alert Orientation Level: Appears intact for tasks assessed Behavior During Session: Flat affect Current  Attention Level: Alternating Problem Solving: difficulty planning ambulation  and path with RW, needing minA verbal to negotiate obstacles    Balance     End of Session PT - End of Session Equipment Utilized During Treatment: Gait belt Activity Tolerance: Patient tolerated treatment well Patient left: in chair;with call bell/phone within reach Nurse Communication: Mobility status   GP     [facility] HELEN 08/30/2012, 9:32 AM

## 2012-08-30 NOTE — Clinical Social Work Placement (Signed)
     Clinical Social Work Department CLINICAL SOCIAL WORK PLACEMENT NOTE 08/30/2012  Patient:  Tina [name], Tina [name]  Account Number:  [id number] Admit date:  08/26/2012  Clinical Social Worker:  Margaree Mackintosh  Date/time:  08/30/2012 12:00 M  Clinical Social Work is seeking post-discharge placement for this patient at the following level of care:   SKILLED NURSING   (*CSW will update this form in Epic as items are completed)   08/30/2012  Patient/family provided with [facility] Department of Clinical Social Works list of facilities offering this level of care within the geographic area requested by the patient (or if unable, by the patients family).  08/30/2012  Patient/family informed of their freedom to choose among providers that offer the needed level of care, that participate in Medicare, Medicaid or managed care program needed by the patient, have an available bed and are willing to accept the patient.  08/30/2012  Patient/family informed of [facility] ownership interest in [facility], as well as of the fact that they are under no obligation to receive care at this facility.  PASARR submitted to EDS on 08/30/2012 PASARR number received from EDS on 08/30/2012  FL2 transmitted to all facilities in geographic area requested by pt/family on  08/30/2012 FL2 transmitted to all facilities within larger geographic area on   Patient informed that his/her managed care company has contracts with or will negotiate with  certain facilities, including the following:     Patient/family informed of bed offers received:  08/30/2012 Patient chooses bed at  Physician recommends and patient chooses bed at    Patient to be transferred to  on   Patient to be transferred to facility by   The following physician request were entered in Epic:   Additional Comments:

## 2012-08-30 NOTE — Clinical Social Work Psychosocial (Signed)
     Clinical Social Work Department BRIEF PSYCHOSOCIAL ASSESSMENT 08/30/2012  Patient:  Tina [name], Tina [name]     Account Number:  [id number]     Admit date:  08/26/2012  Clinical Social Worker:  Margaree Mackintosh  Date/Time:  08/30/2012 12:00 M  Referred by:  Physician  Date Referred:  08/30/2012 Referred for  SNF Placement   Other Referral:   Interview type:  Patient Other interview type:    PSYCHOSOCIAL DATA Living Status:  ALONE Admitted from facility:   Level of care:   Primary support name:  Iantha Fallen Hino:[phone] Primary support relationship to patient:  FAMILY Degree of support available:   Nephew; adequate.    CURRENT CONCERNS Current Concerns  Post-Acute Placement   Other Concerns:    SOCIAL WORK ASSESSMENT / PLAN Clinical Social Worker recieved referral for SNF.  CSW reviewed chart and met with pt at bedside.  CSW introduced self, explained role, and provided support.  CSW reviewed SNF process; pt agreeable.  CSW submitted appropriate clincial information for SNF search.  CSW reviewed bed offers with pt and pt's nephew.  CSW to continue to follow and assist as needed.   Assessment/plan status:  Information/Referral to [facility] Other assessment/ plan:   Information/referral to community resources:   SNF.    PATIENTS/FAMILYS RESPONSE TO PLAN OF CARE: Pt was pleasant and engaged in conversation.

## 2012-08-30 NOTE — Progress Notes (Signed)
Clinical Social Worker currently experiencing technical difficulties with [facility] system-Full assessment to follow once system available.   CSW completed psychosocial assessment, pt agreeable to SNF placement.  Passr received and appropriate clinical information sent for SNF search.    Angelia Mould, MSW, [place] [phone]

## 2012-08-30 NOTE — Progress Notes (Signed)
Subjective: 3 Days Post-Op Procedure(s) (LRB): ARTHROPLASTY BIPOLAR HIP (Left) Feels pretty good . Going to SNF most likely Activity level:  oob wbat left leg Diet tolerance:  eating Voiding:  ok Patient reports pain as 2 on 0-10 scale.    Objective: Vital signs in last 24 hours: Temp:  [98 F (36.7 C)-99.6 F (37.6 C)] 98.5 F (36.9 C) (12/09 0800) Pulse Rate:  [86-94] 88  (12/09 0800) Resp:  [16-20] 20  (12/09 0800) BP: (121-160)/(70-77) 143/77 mmHg (12/09 0800) SpO2:  [94 %-98 %] 98 % (12/09 0800)  Labs:  Basename 08/30/12 0755 08/29/12 0645  HGB 10.5* 10.5*    Basename 08/30/12 0755 08/29/12 0645  WBC 9.2 7.6  RBC 3.32* 3.32*  HCT 30.7* 30.7*  PLT 141* 109*    Basename 08/30/12 0755 08/29/12 0645  NA PENDING 139  K PENDING 3.2*  CL PENDING 107  CO2 23 23  BUN 10 8  CREATININE 0.73 0.71  GLUCOSE 69* 90  CALCIUM 8.9 8.4   No results found for this basename: LABPT:2,INR:2 in the last 72 hours  Physical Exam:  Neurologically intact ABD soft Neurovascular intact Sensation intact distally Intact pulses distally Dorsiflexion/Plantar flexion intact Incision: dressing C/D/I No cellulitis present  Assessment/Plan:  3 Days Post-Op Procedure(s) (LRB): ARTHROPLASTY BIPOLAR HIP (Left) Advance diet Up with therapy Discharge to SNF vs home . Ortho stable May be wbat   ASA 325 bid fo 3-4 weeks post op to prevent dvt May change dressing.   RTO 2-3 weeks    Shantinique Picazo R 08/30/2012, 9:33 AM

## 2012-08-30 NOTE — Care Management Note (Signed)
    Page 1 of 1   08/30/2012     4:18:17 PM   CARE MANAGEMENT NOTE 08/30/2012  Patient:  Tina [name], Tina [name]   Account Number:  [id number]  Date Initiated:  08/30/2012  Documentation initiated by:  GRAVES-BIGELOW,Bayne Fosnaugh  Subjective/Objective Assessment:   Pt admitted for left hip hemiarthroplasty.     Action/Plan:   Plan is for SNF. CSW working with pt.   Anticipated DC Date:  08/31/2012   Anticipated DC Plan:  SKILLED NURSING FACILITY  In-house referral  Clinical Social Worker      DC Planning Services  CM consult      Choice offered to / List presented to:             Status of service:  Completed, signed off Medicare Important Message given?   (If response is "NO", the following Medicare IM given date fields will be blank) Date Medicare IM given:   Date Additional Medicare IM given:    Discharge Disposition:  SKILLED NURSING FACILITY  Per UR Regulation:  Reviewed for med. necessity/level of care/duration of stay  If discussed at Long Length of Stay Meetings, dates discussed:    Comments:

## 2012-08-30 NOTE — Evaluation (Signed)
Occupational Therapy Evaluation Patient Details Name: Tina [name] MRN: [id number] DOB: 1956/05/17 Today's Date: 08/30/2012 Time: 8295-6213 OT Time Calculation (min): 43 min  OT Assessment / Plan / Recommendation Clinical Impression  Pt. is a pleasant lady who was at [facility] for an outpatient xray for a respiratory illness when she passed out and fell, fracturing her left hip. She has now undergone left hip hemiarthroplasty by Dr. Jerl Santos and she presents with a decrease in her safety and independence. Pt is WBAT and has posterior left hip precautions. She also has some generalized weakness from acute respiratory illness/flu x 1 week. Pt recognizes she needs more assist than will be available to her at d/c and therefore is agreeable to SNF for d/c. Will follow acutely to maximize I with ADL and ADL mobility prior to d/c    OT Assessment  Patient needs continued OT Services    Follow Up Recommendations  SNF    Barriers to Discharge      Equipment Recommendations       Recommendations for Other Services    Frequency  Min 2X/week    Precautions / Restrictions Precautions Precautions: Fall;Posterior Hip Restrictions LLE Weight Bearing: Weight bearing as tolerated   Pertinent Vitals/Pain Pt with no c/o pain during session     ADL  Grooming: Performed;Supervision/safety Where Assessed - Grooming: Unsupported standing Lower Body Bathing: Moderate assistance Where Assessed - Lower Body Bathing: Unsupported sit to stand Upper Body Dressing: Set up Where Assessed - Upper Body Dressing: Unsupported sitting Lower Body Dressing: Maximal assistance Where Assessed - Lower Body Dressing: Unsupported sit to stand Toilet Transfer: Min Pension scheme manager Method: Sit to Barista: Bedside commode Toileting - Clothing Manipulation and Hygiene: Minimal assistance Where Assessed - Engineer, mining and Hygiene: Standing Equipment Used:  Gait belt;Rolling walker Transfers/Ambulation Related to ADLs: min guard A with RW ambulation; cues during turns to turn feet frequently/before rotating RW to prevent toes from pointing in    OT Diagnosis: Generalized weakness;Acute pain  OT Problem List: Decreased strength;Impaired balance (sitting and/or standing);Decreased activity tolerance;Decreased knowledge of use of DME or AE;Decreased knowledge of precautions;Pain OT Treatment Interventions: Self-care/ADL training;DME and/or AE instruction;Patient/family education;Balance training   OT Goals Acute Rehab OT Goals OT Goal Formulation: With patient Time For Goal Achievement: 09/13/12 Potential to Achieve Goals: Good ADL Goals Pt Will Perform Lower Body Dressing: with modified independence;with adaptive equipment;Sit to stand from bed;Sit to stand from chair ADL Goal: Lower Body Dressing - Progress: Goal set today Pt Will Transfer to Toilet: with modified independence;with DME;Ambulation ADL Goal: Toilet Transfer - Progress: Goal set today Pt Will Perform Toileting - Clothing Manipulation: Independently;Standing ADL Goal: Toileting - Clothing Manipulation - Progress: Goal set today Pt Will Perform Toileting - Hygiene: with modified independence;Standing at 3-in-1/toilet;with adaptive equipment;Sit to stand from 3-in-1/toilet ADL Goal: Toileting - Hygiene - Progress: Goal set today Additional ADL Goal #1: Pt will I'ly verbalize/generalize 3/3 left posterior hip precautions for use during functional activities. ADL Goal: Additional Goal #1 - Progress: Goal set today  Visit Information  Last OT Received On: 08/30/12 Assistance Needed: +1    Subjective Data  Subjective: I think I'll go to a facility to get better Patient Stated Goal: Return home   Prior Functioning     Home Living Lives With: Son (son works 2nd shift) Available Help at Discharge: Friend(s);Family;Available 24 hours/day;Other (Comment) (son, friend and sister  can provide 24 hour per pt.) Type of Home: House Home  Access: Stairs to enter [employer] of Steps: 3 Entrance Stairs-Rails: None Home Layout: One level Bathroom Shower/Tub: Forensic scientist: Standard Bathroom Accessibility: Yes How Accessible: Accessible via walker Home Adaptive Equipment: None Prior Function Level of Independence: Independent Able to Take Stairs?: Yes Driving: Yes Vocation:  (homemaker) Communication Communication: No difficulties Dominant Hand: Right         Vision/Perception Vision - Assessment Additional Comments: pt with difficulty navigating around objects on both her right and left. Will perform confrontation testing next session   Cognition  Area of Impairment: Problem solving;Attention Arousal/Alertness: Awake/alert Orientation Level: Appears intact for tasks assessed Behavior During Session: Flat affect Current Attention Level: Alternating Problem Solving: difficulty planning ambulation and path with RW, needing minA verbal to negotiate obstacles    Extremity/Trunk Assessment Right Upper Extremity Assessment RUE ROM/Strength/Tone: [facility] for tasks assessed Left Upper Extremity Assessment LUE ROM/Strength/Tone: WFL for tasks assessed     Mobility Bed Mobility Supine to Sit: 4: Min guard Sitting - Scoot to Edge of Bed: 5: Supervision Details for Bed Mobility Assistance: pt attempted to get up initially with poor maintainence of posterior hip precautions, with step by step verbal sequencing cues pt able to perform bed mobility mingaurdA while maintaining hip precautions Transfers Sit to Stand: 4: Min guard;With upper extremity assist;From bed;From chair/3-in-1;With armrests Stand to Sit: With upper extremity assist;To chair/3-in-1;With armrests Details for Transfer Assistance: cues for safe technique to maintain hip precautions during transfer     Shoulder Instructions     Exercise     Balance     End of  Session OT - End of Session Equipment Utilized During Treatment: Gait belt Activity Tolerance: Patient tolerated treatment well Patient left: in chair;with call bell/phone within reach Nurse Communication: Mobility status  GO     Mickie Kozikowski 08/30/2012, 1:44 PM

## 2012-08-30 NOTE — Progress Notes (Signed)
[facility] PROGRESS NOTE  Tina [name] ZOX:[id number] DOB: [birth year] DOA: 08/26/2012 PCP: Sheila Oats, MD  Assessment/Plan: 1-Femoral fracture: per ortho. S/P left hip arthroplasty. Pain is well controlled. And per PT evaluation, Will require SNF short term for rehabilitation and care. Anticoagulation and wound care per ortho recommendations.  2-Bronchitis/early PNA: continue levaquin. Patient now w/o fever and normal WBC's. Will check CXR in am. Day 5/10 of antibiotics. CXR improving  3-Tobacco abuse: counseling provided.  4-Diabetes mellitus Type 2: controlled. A1C 6.5; mild hypoglycemia this morning again, will discontinue HS SSI coverage; she has not been eating much inside the hospital either.  5-PAF (paroxysmal atrial fibrillation): continue acebutolol; rate controlled. No abnormalities on telemetry. No CP  6-asthma/COPD exacerbation: secondary to infection most likely. -continue tapering prednisone slowly -continue abx's (5/10) -continue nebulizer tx  7-Chest discomfort: troponin negative; EKG w/o ischemic changes. Most likely worsening of GERD with prednisone therapy. No further CP. Continue PPI.  8-GERD (gastroesophageal reflux disease): continue carafate and PPI BID. No CP or any reflux symptoms this morning.  Code Status: full Family Communication: Spoke with daughter by Phone (Katrina, lives in [place]; patient use on cell phone to contact daughter) Disposition Plan: after discussing with family better and concerns of not availability for 24 hours assistance and care at home, plan is to d/c to SNF when bed available.  DVT: lovenox while in hospital. Then 3-4 weeks on full dose ASA per ortho rec's.   Consultants:  ortho  Procedures:  Left hip arthroplasty  Antibiotics:  levaquin  HPI/Subjective: Patient reports feeling better, and breathing a lot better. No fever and denies any CP or SOB. Overnight with hypoglycemic event (she has not been eating  much; not using insulin at home)  Objective: Filed Vitals:   08/30/12 0000 08/30/12 0627 08/30/12 0800 08/30/12 0952  BP: 143/72 130/74 143/77 124/58  Pulse: 89 86 88 82  Temp: 98.6 F (37 C) 99.6 F (37.6 C) 98.5 F (36.9 C)   TempSrc:   Oral   Resp: 18 16 20    Height:      Weight:      SpO2: 95% 94% 98%     Intake/Output Summary (Last 24 hours) at 08/30/12 1005 Last data filed at 08/30/12 0839  Gross per 24 hour  Intake    600 ml  Output   1301 ml  Net   -701 ml   Filed Weights   08/27/12 0406  Weight: 72.439 kg (159 lb 11.2 oz)    Exam:   General:  NAD, afebrile  Cardiovascular: S1 and S2, no rubs or gallops  Respiratory: scattered rhonchi, no wheezing; good air movement  Abdomen: soft, NT, ND positive BS  Extremities: LLE with some pain with movement, no edema or cyanosis; clean wound  Neuro no focal  Data Reviewed: Basic Metabolic Panel:  Lab 08/30/12 8119 08/29/12 0645 08/27/12 0605 08/27/12 0107  NA 138 139 140 138  K 3.8 3.2* 3.9 3.8  CL 105 107 105 104  CO2 23 23 23  --  GLUCOSE 69* 90 111* 112*  BUN 10 8 11 10   CREATININE 0.73 0.71 0.80 0.90  CALCIUM 8.9 8.4 8.5 --  MG -- -- -- --  PHOS -- -- -- --   CBC:  Lab 08/30/12 0755 08/29/12 0645 08/27/12 0605 08/27/12 0107 08/27/12 0023  WBC 9.2 7.6 5.8 -- 5.3  NEUTROABS -- -- -- -- 3.5  HGB 10.5* 10.5* 12.4 13.9 13.2  HCT 30.7* 30.7* 37.2 41.0 39.5  MCV 92.5  92.5 94.7 -- 94.7  PLT 141* 109* 121* -- 129*   Cardiac Enzymes:  Lab 08/28/12 1820 08/28/12 1304 08/28/12 0605 08/27/12 0023  CKTOTAL -- -- -- --  CKMB -- -- -- --  CKMBINDEX -- -- -- --  TROPONINI <0.30 <0.30 <0.30 <0.30   CBG:  Lab 08/30/12 0917 08/30/12 0733 08/29/12 1947 08/29/12 1620 08/29/12 1231  GLUCAP 80 55* 235* 184* 104*    Studies: Dg Chest 2 View  08/30/2012  *RADIOLOGY REPORT*  Clinical Data: Cough.  Shortness of breath.  Asthma.  CHEST - 2 VIEW  Comparison: 08/27/2012  Findings: Increased asymmetric airspace  disease is seen in the left perihilar region in the central left upper lobe, suspicious for bronchopneumonia.  No other areas of pulmonary infiltrate are seen. No evidence of pleural effusion.  The heart size is normal.  No definite mass or lymphadenopathy identified.  IMPRESSION: Increased left perihilar opacity, suspicious for bronchopneumonia. Post-treatment  radiographic followup recommended to confirm resolution.   Original Report Authenticated By: Myles Rosenthal, M.D.    Dg Pelvis Portable  08/28/2012  *RADIOLOGY REPORT*  Clinical Data: 56 year old female - status post left hip fracture and left hip hemiarthroplasty.  PORTABLE PELVIS  Comparison: 08/26/2012  Findings: Frontal views of the pelvis and left hip demonstrate left hip hemiarthroplasty without complicating features. There is no evidence of fracture, subluxation or dislocation. No complicating hardware features are identified. Soft tissue postoperative changes are present.  IMPRESSION: Left hip hemiarthroplasty without complicating features.   Original Report Authenticated By: Harmon Pier, M.D.     Scheduled Meds:    . acebutolol  400 mg Oral BID  . desvenlafaxine  50 mg Oral Daily  . enoxaparin (LOVENOX) injection  40 mg Subcutaneous Q24H  . glipiZIDE  5 mg Oral Q breakfast  . insulin aspart  0-15 Units Subcutaneous TID WC  . insulin aspart  0-5 Units Subcutaneous QHS  . levofloxacin  750 mg Oral Q0600  . metFORMIN  500 mg Oral BID WC  . nicotine  14 mg Transdermal Daily  . pantoprazole  40 mg Oral BID AC  . [COMPLETED] potassium chloride  40 mEq Oral Q4H  . predniSONE  30 mg Oral Q breakfast  . sucralfate  1 g Oral QID  . [DISCONTINUED] glipiZIDE  10 mg Oral Q breakfast  . [DISCONTINUED] insulin aspart  0-15 Units Subcutaneous Q6H  . [DISCONTINUED] predniSONE  40 mg Oral Q breakfast   Continuous Infusions:    . sodium chloride 20 mL (08/29/12 1233)    Time spent: >30 minutes    Brittan Butterbaugh  [facility] Pager  [phone]. If 8PM-8AM, please contact night-coverage at [email or web address], password [facility] 08/30/2012, 10:05 AM  LOS: 4 days

## 2012-08-30 NOTE — Progress Notes (Signed)
Hypoglycemic Event  CBG:50  Treatment: 15 GM carbohydrate snack  Symptoms: None  Follow-up CBG: Time:0930 CBG Result:80  Possible Reasons for Event: Inadequate meal intake  Comments/MD notified:Dr. Gwenlyn Perking notified, no new orders received.    Efraim Kaufmann  Remember to initiate Hypoglycemia Order Set & complete

## 2012-08-31 ENCOUNTER — Encounter (HOSPITAL_COMMUNITY): Payer: Self-pay | Admitting: Orthopaedic Surgery

## 2012-08-31 LAB — GLUCOSE, CAPILLARY: Glucose-Capillary: 74 mg/dL (ref 70–99)

## 2012-08-31 MED ORDER — PREDNISONE 10 MG PO TABS: ORAL_TABLET | ORAL | Status: DC

## 2012-08-31 MED ORDER — LEVOFLOXACIN 750 MG PO TABS: 750.0000 mg | ORAL_TABLET | Freq: Every day | ORAL | Status: AC

## 2012-08-31 MED ORDER — SENNOSIDES-DOCUSATE SODIUM 8.6-50 MG PO TABS: 1.0000 | ORAL_TABLET | Freq: Every evening | ORAL | Status: DC | PRN

## 2012-08-31 MED ORDER — LEVOFLOXACIN 750 MG PO TABS: 750.0000 mg | ORAL_TABLET | Freq: Every day | ORAL | Status: DC

## 2012-08-31 MED ORDER — OMEPRAZOLE 20 MG PO CPDR: 40.0000 mg | DELAYED_RELEASE_CAPSULE | Freq: Two times a day (BID) | ORAL | Status: DC

## 2012-08-31 NOTE — Discharge Summary (Signed)
Physician Discharge Summary  Tina [name] FAO:[id number] DOB: [birth year] DOA: 08/26/2012  PCP: Sheila Oats, MD  Admit date: 08/26/2012 Discharge date: 08/31/2012  Time spent: >30 minutes  Recommendations for Outpatient Follow-up:  -Keep yourself well hydrated -Take medications as prescribed -Follow instructions from PT/OT at SNF for physical rehab -Follow up with orthopedic service as instructed.  Discharge Diagnoses:  Femoral fracture Syncope Tobacco abuse Diabetes Hyperlipidemia PAF (paroxysmal atrial fibrillation) COPD (chronic obstructive pulmonary disease) GERD (gastroesophageal reflux disease) Vitamin deficiency   Discharge Condition: stable and improved. S/P left hip arthroplasty. Will be discharge to SNF for further care and physical rehabilitation. Patient will finish antibiotics and tapered steroids as instructed. Advised to quit smoking. Will follow with Orthopedic service in 2 weeks.  Diet recommendation: low carbohydrates and heart healthy.  Filed Weights   08/27/12 0406  Weight: 72.439 kg (159 lb 11.2 oz)    History of present illness:  56 y/o female with hx of asthma, remote hx of a. Fib, GERD, sleep apnea and diabetes; came to the hospital after midnight after been sick for more than 1 week with respiratory symptoms. Patient reports difficulty breathing, intermittent fever, cough, general malaise and decrease PO intake (fluids and solids). While patient was waiting in registration to be seen she pass out and during the fall adquire a femoral neck fracture.   Hospital Course:  1-Femoral fracture: per ortho. S/P left hip arthroplasty. Pain is well controlled. And per PT evaluation, Will require SNF short term for rehabilitation and care. Anticoagulation and wound care per ortho recommendations. ASA for 3 weeks. Follow up with ortho service in 2 weeks. Wound care as instructed and weight bearing as tolerated.  2-Bronchitis/early PNA: continue levaquin.  Patient now w/o fever and normal WBC's. Received 6 days of antibiotics inside the hospital. Will finish treatment with 4 more days. Will need CXR in 4-6 weeks to follow resolution of lungs infiltrates.   3-Tobacco abuse: counseling provided.   4-Diabetes mellitus Type 2: controlled. A1C 6.5. Will continue low carb diet and the use of glipizide and metformin.  5-PAF (paroxysmal atrial fibrillation): continue acebutolol; rate controlled. No abnormalities on telemetry. No CP   6-asthma/COPD exacerbation: secondary to infection most likely.  -continue tapering prednisone (4 more days)  -continue abx's (to finish tx in 4 days)  -continue inhaler tx. Will benefit of PFT's as an outpatient after acute infections resolved. Advised to wuit smoking.  7-Chest discomfort: atypical and most likely 2/2 GERD.Troponin negative; EKG w/o ischemic changes. Most No further CP. Continue PPI and carafate.  8-GERD (gastroesophageal reflux disease): continue carafate and PPI BID. No CP or any reflux symptoms this morning.   Procedures: Left arthroplasty  Consultations:  Orthopedic service  Discharge Exam: Filed Vitals:   08/30/12 1148 08/30/12 1855 08/30/12 2132 08/31/12 0639  BP: 122/60 137/64 136/63 124/68  Pulse: 87 92 96 83  Temp: 98.5 F (36.9 C) 99 F (37.2 C) 99.2 F (37.3 C) 98.5 F (36.9 C)  TempSrc: Oral Oral Oral Oral  Resp: 20 20 18 18   Height:      Weight:      SpO2: 99% 98% 98% 95%   General: NAD, afebrile  Cardiovascular: S1 and S2, no rubs or gallops  Respiratory: scattered rhonchi, no wheezing; good air movement  Abdomen: soft, NT, ND positive BS  Extremities: LLE with some pain with movement, no edema or cyanosis; clean wound  Neuro no focal   Discharge Instructions  Discharge Orders    Future Orders Please Complete  By Expires   Weight bearing as tolerated      Comments:   WBAT on Left leg.   Discharge instructions      Comments:   -Keep yourself well  hydrated -Take medications as prescribed -Follow instructions from PT/OT at SNF for physical rehab -follow up with orthopedic service as instructed.       Medication List     As of 08/31/2012  2:18 PM    STOP taking these medications         albuterol 108 (90 BASE) MCG/ACT inhaler   Commonly known as: PROVENTIL HFA;VENTOLIN HFA      TAKE these medications         acebutolol 400 MG capsule   Commonly known as: SECTRAL   Take 400 mg by mouth 2 (two) times daily.      albuterol-ipratropium 18-103 MCG/ACT inhaler   Commonly known as: COMBIVENT   Inhale 2 puffs into the lungs every 6 (six) hours as needed. For breathing      aspirin EC 325 MG tablet   Take 1 tablet (325 mg total) by mouth 2 (two) times daily after a meal.      desvenlafaxine 50 MG 24 hr tablet   Commonly known as: PRISTIQ   Take 50 mg by mouth daily.      glipiZIDE 10 MG 24 hr tablet   Commonly known as: GLUCOTROL XL   Take 10 mg by mouth daily.      levofloxacin 750 MG tablet   Commonly known as: LEVAQUIN   Take 1 tablet (750 mg total) by mouth daily at 6 (six) AM.      lovastatin 20 MG tablet   Commonly known as: MEVACOR   Take 20 mg by mouth at bedtime.      magnesium oxide 400 MG tablet   Commonly known as: MAG-OX   Take 400 mg by mouth daily.      medroxyPROGESTERone 150 MG/ML injection   Commonly known as: DEPO-PROVERA   Inject 150 mg into the muscle every 3 (three) months.      metFORMIN 500 MG tablet   Commonly known as: GLUCOPHAGE   Take 500 mg by mouth 2 (two) times daily with a meal.      omeprazole 20 MG capsule   Commonly known as: PRILOSEC   Take 2 capsules (40 mg total) by mouth 2 (two) times daily.      oxyCODONE-acetaminophen 5-325 MG per tablet   Commonly known as: PERCOCET/ROXICET   Take 1-2 tablets by mouth every 6 (six) hours as needed for pain.      predniSONE 10 MG tablet   Commonly known as: DELTASONE   Take 3 tabs by mouth daily X 1 day; then 2 tabs by mouth  daily X 2 days and then 1 tab by mouth X1 day and stop prednisone      senna-docusate 8.6-50 MG per tablet   Commonly known as: Senokot-S   Take 1 tablet by mouth at bedtime as needed for constipation (hold for diarrhea).      sucralfate 1 G tablet   Commonly known as: CARAFATE   Take 1 g by mouth 4 (four) times daily.           Follow-up Information    Follow up with Velna Ochs, MD. Schedule an appointment as soon as possible for a visit in 2 weeks.   Contact information:   [address] [place] [address] [phone]  Schedule an appointment as soon as possible for a visit with DEFAULT,PROVIDER, MD. (2 weeks after discharge from facility)    Contact information:   [address] [place] [address] [phone]           The results of significant diagnostics from this hospitalization (including imaging, microbiology, ancillary and laboratory) are listed below for reference.    Significant Diagnostic Studies: Dg Chest 2 View  08/30/2012  *RADIOLOGY REPORT*  Clinical Data: Cough.  Shortness of breath.  Asthma.  CHEST - 2 VIEW  Comparison: 08/27/2012  Findings: Increased asymmetric airspace disease is seen in the left perihilar region in the central left upper lobe, suspicious for bronchopneumonia.  No other areas of pulmonary infiltrate are seen. No evidence of pleural effusion.  The heart size is normal.  No definite mass or lymphadenopathy identified.  IMPRESSION: Increased left perihilar opacity, suspicious for bronchopneumonia. Post-treatment  radiographic followup recommended to confirm resolution.   Original Report Authenticated By: Myles Rosenthal, M.D.    Dg Chest 2 View  08/27/2012  *RADIOLOGY REPORT*  Clinical Data: Syncope.  Left hip fracture. Pre-op respiratory exam.  CHEST - 2 VIEW  Comparison:  08/26/2012  Findings:  The heart size and mediastinal contours are within normal limits.  Both lungs are clear.  The visualized skeletal structures are  unremarkable.  IMPRESSION: No active cardiopulmonary disease.   Original Report Authenticated By: Myles Rosenthal, M.D.    Dg Knee 1-2 Views Left  08/27/2012  *RADIOLOGY REPORT*  Clinical Data: Status post fall; left knee pain.  LEFT KNEE - 1-2 VIEW  Comparison: None.  Findings: There is no evidence of fracture or dislocation.  The joint spaces are preserved.  No significant degenerative change is seen; the patellofemoral joint is grossly unremarkable in appearance.  A small enthesophyte is noted at the superior pole of the patella.  No significant joint effusion is seen.  The visualized soft tissues are normal in appearance.  IMPRESSION: No evidence of fracture or dislocation.   Original Report Authenticated By: Tonia Ghent, M.D.    Dg Pelvis Portable  08/28/2012  *RADIOLOGY REPORT*  Clinical Data: 56 year old female - status post left hip fracture and left hip hemiarthroplasty.  PORTABLE PELVIS  Comparison: 08/26/2012  Findings: Frontal views of the pelvis and left hip demonstrate left hip hemiarthroplasty without complicating features. There is no evidence of fracture, subluxation or dislocation. No complicating hardware features are identified. Soft tissue postoperative changes are present.  IMPRESSION: Left hip hemiarthroplasty without complicating features.   Original Report Authenticated By: Harmon Pier, M.D.     Labs: Basic Metabolic Panel:  Lab 08/30/12 5366 08/29/12 0645 08/27/12 0605 08/27/12 0107  NA 138 139 140 138  K 3.8 3.2* 3.9 3.8  CL 105 107 105 104  CO2 23 23 23  --  GLUCOSE 69* 90 111* 112*  BUN 10 8 11 10   CREATININE 0.73 0.71 0.80 0.90  CALCIUM 8.9 8.4 8.5 --  MG -- -- -- --  PHOS -- -- -- --   CBC:  Lab 08/30/12 0755 08/29/12 0645 08/27/12 0605 08/27/12 0107 08/27/12 0023  WBC 9.2 7.6 5.8 -- 5.3  NEUTROABS -- -- -- -- 3.5  HGB 10.5* 10.5* 12.4 13.9 13.2  HCT 30.7* 30.7* 37.2 41.0 39.5  MCV 92.5 92.5 94.7 -- 94.7  PLT 141* 109* 121* -- 129*   Cardiac Enzymes:  Lab  08/28/12 1820 08/28/12 1304 08/28/12 0605 08/27/12 0023  CKTOTAL -- -- -- --  CKMB -- -- -- --  CKMBINDEX -- -- -- --  TROPONINI <0.30 <0.30 <0.30 <0.30   CBG:  Lab 08/31/12 1057 08/31/12 0641 08/30/12 2133 08/30/12 1736 08/30/12 1144  GLUCAP 128* 74 151* 328* 132*    Signed:  Julie-Anne Torain  [facility] 08/31/2012, 2:18 PM

## 2012-08-31 NOTE — Progress Notes (Signed)
Pt d/c'd via ambulance to snf in stable condition. Reviewed d/c instructions and no further questions.

## 2012-08-31 NOTE — Progress Notes (Signed)
Subjective: 4 Days Post-Op Procedure(s) (LRB): ARTHROPLASTY BIPOLAR HIP (Left)  Activity level:  Can be out of bed weightbearing as tolerated Diet tolerance:  ok Voiding:  ok Patient reports pain as 1 on 0-10 scale.    Objective: Vital signs in last 24 hours: Temp:  [98.5 F (36.9 C)-99.2 F (37.3 C)] 98.5 F (36.9 C) (12/10 0639) Pulse Rate:  [83-96] 83  (12/10 0639) Resp:  [18-20] 18  (12/10 0639) BP: (124-137)/(63-68) 124/68 mmHg (12/10 0639) SpO2:  [95 %-98 %] 95 % (12/10 0639)  Labs:  Basename 08/30/12 0755 08/29/12 0645  HGB 10.5* 10.5*    Basename 08/30/12 0755 08/29/12 0645  WBC 9.2 7.6  RBC 3.32* 3.32*  HCT 30.7* 30.7*  PLT 141* 109*    Basename 08/30/12 0755 08/29/12 0645  NA 138 139  K 3.8 3.2*  CL 105 107  CO2 23 23  BUN 10 8  CREATININE 0.73 0.71  GLUCOSE 69* 90  CALCIUM 8.9 8.4   No results found for this basename: LABPT:2,INR:2 in the last 72 hours  Physical Exam:  Neurologically intact ABD soft Neurovascular intact Sensation intact distally Intact pulses distally Dorsiflexion/Plantar flexion intact No cellulitis present Compartment soft  Assessment/Plan:  4 Days Post-Op Procedure(s) (LRB): ARTHROPLASTY BIPOLAR HIP (Left) Discharge to SNF when bed is available. Continue ASA 325 1 pill twice a day for 3-4 weeks. May be weightbearing as tolerated. Staples out in 2 weeks from surgery Return to see Dr. Jerl Santos in 2-3 weeks. Office number [phone] May change dressing as needed on hip.     Brenyn Petrey R 08/31/2012, 1:20 PM

## 2012-08-31 NOTE — Progress Notes (Signed)
Physical Therapy Treatment Patient Details Name: Tina [name] MRN: [id number] DOB: 1955-09-25 Today's Date: 08/31/2012 Time: 1436-1500 PT Time Calculation (min): 24 min  PT Assessment / Plan / Recommendation Comments on Treatment Session  Doing well with all aspects of treatment, but still will need ST SNF to regain more independence    Follow Up Recommendations  SNF     Does the patient have the potential to tolerate intense rehabilitation     Barriers to Discharge        Equipment Recommendations       Recommendations for Other Services    Frequency Min 6X/week   Plan Discharge plan remains appropriate;Frequency remains appropriate    Precautions / Restrictions Precautions Precautions: Fall;Posterior Hip Precaution Comments: recalled 3/3 precautions Restrictions LLE Weight Bearing: Weight bearing as tolerated   Pertinent Vitals/Pain 0/10 pain    Mobility  Bed Mobility Bed Mobility: Supine to Sit;Sitting - Scoot to Edge of Bed Supine to Sit: 4: Min guard Sitting - Scoot to [place] of Bed: 5: Supervision Details for Bed Mobility Assistance: cues to maintain neutal rotation of the leg, pt's Left leg tended to roll in during  exiting bed. Transfers Transfers: Sit to Stand;Stand to Sit Sit to Stand: 4: Min guard;With upper extremity assist;From bed;From chair/3-in-1;With armrests Stand to Sit: With upper extremity assist;To chair/3-in-1;With armrests Stand to Sit: Patient Percentage: 50% Details for Transfer Assistance: vc's for hand placement and safety Ambulation/Gait Ambulation/Gait Assistance: 5: Supervision Ambulation Distance (Feet): 140 Feet Assistive device: Rolling walker Gait Pattern: Step-to pattern Gait velocity: decreased gait speed Stairs: No    Exercises Total Joint Exercises Quad Sets: AROM;Left;10 reps;Supine Gluteal Sets: AROM;Strengthening;Both;10 reps;Supine Heel Slides: Strengthening;Left;10 reps;Supine;AROM Hip ABduction/ADduction:  AROM;Left;10 reps;Supine   PT Diagnosis:    PT Problem List:   PT Treatment Interventions:     PT Goals Acute Rehab PT Goals PT Goal Formulation: With patient Time For Goal Achievement: 09/04/12 Potential to Achieve Goals: Good PT Goal: Supine/Side to Sit - Progress: Progressing toward goal PT Goal: Sit to Supine/Side - Progress: Progressing toward goal PT Goal: Sit to Stand - Progress: Progressing toward goal PT Goal: Stand to Sit - Progress: Progressing toward goal PT Transfer Goal: Bed to Chair/Chair to Bed - Progress: Progressing toward goal PT Goal: Ambulate - Progress: Met  Visit Information  Last PT Received On: 08/31/12 Assistance Needed: +1    Subjective Data  Subjective: No pain...   Cognition  Overall Cognitive Status: Appears within functional limits for tasks assessed/performed Arousal/Alertness: Awake/alert Orientation Level: Appears intact for tasks assessed Behavior During Session: [facility] for tasks performed    Balance  Balance Balance Assessed: No  End of Session PT - End of Session Activity Tolerance: Patient tolerated treatment well Patient left: in chair;with call bell/phone within reach Nurse Communication: Mobility status   GP     Torin Modica, Eliseo Gum 08/31/2012, 3:21 PM  08/31/2012  [place] Bing, PT [phone] (pager)

## 2012-08-31 NOTE — Progress Notes (Signed)
Clinical Social Worker spoke with pt regarding SNF choices; pt would like to dc to [facility]. CSW spoke with Irving Burton at [facility] who is able to offer a bed today.  CSW submitted DC summary via CFP.  CSW arranged transport.  CSW to sign off at dc, please re consult if needed.   Angelia Mould, [place] [phone]

## 2013-02-17 DIAGNOSIS — G4733 Obstructive sleep apnea (adult) (pediatric): Secondary | ICD-10-CM | POA: Insufficient documentation

## 2013-02-17 DIAGNOSIS — K648 Other hemorrhoids: Secondary | ICD-10-CM | POA: Insufficient documentation

## 2013-02-17 DIAGNOSIS — E539 Vitamin B deficiency, unspecified: Secondary | ICD-10-CM | POA: Insufficient documentation

## 2013-02-17 DIAGNOSIS — K5730 Diverticulosis of large intestine without perforation or abscess without bleeding: Secondary | ICD-10-CM | POA: Insufficient documentation

## 2013-02-17 DIAGNOSIS — D510 Vitamin B12 deficiency anemia due to intrinsic factor deficiency: Secondary | ICD-10-CM | POA: Insufficient documentation

## 2013-11-23 DIAGNOSIS — Z1331 Encounter for screening for depression: Secondary | ICD-10-CM | POA: Insufficient documentation

## 2015-01-09 DIAGNOSIS — Z2821 Immunization not carried out because of patient refusal: Secondary | ICD-10-CM | POA: Insufficient documentation

## 2015-07-02 ENCOUNTER — Other Ambulatory Visit: Payer: Self-pay

## 2015-07-02 DIAGNOSIS — Z1231 Encounter for screening mammogram for malignant neoplasm of breast: Secondary | ICD-10-CM

## 2015-08-10 ENCOUNTER — Ambulatory Visit: Payer: Self-pay

## 2015-08-10 ENCOUNTER — Other Ambulatory Visit: Payer: Self-pay

## 2015-08-10 ENCOUNTER — Ambulatory Visit
Admission: RE | Admit: 2015-08-10 | Discharge: 2015-08-10 | Disposition: A | Discharge status: Home / Self Care | Payer: Medicaid Other | Source: Ambulatory Visit

## 2015-08-10 DIAGNOSIS — Z1231 Encounter for screening mammogram for malignant neoplasm of breast: Secondary | ICD-10-CM

## 2015-11-08 ENCOUNTER — Ambulatory Visit: Payer: Medicaid Other | Admitting: Internal Medicine

## 2015-12-13 DIAGNOSIS — I10 Essential (primary) hypertension: Secondary | ICD-10-CM | POA: Insufficient documentation

## 2016-05-08 ENCOUNTER — Encounter: Payer: Self-pay | Admitting: Neurology

## 2016-05-08 ENCOUNTER — Ambulatory Visit (INDEPENDENT_AMBULATORY_CARE_PROVIDER_SITE_OTHER): Payer: Medicaid Other | Admitting: Neurology

## 2016-05-08 VITALS — BP 180/102 | HR 88 | Resp 20 | Ht 66.0 in | Wt 154.0 lb

## 2016-05-08 DIAGNOSIS — G4733 Obstructive sleep apnea (adult) (pediatric): Secondary | ICD-10-CM

## 2016-05-08 DIAGNOSIS — R351 Nocturia: Secondary | ICD-10-CM | POA: Diagnosis not present

## 2016-05-08 DIAGNOSIS — I10 Essential (primary) hypertension: Secondary | ICD-10-CM

## 2016-05-08 DIAGNOSIS — G4710 Hypersomnia, unspecified: Secondary | ICD-10-CM | POA: Diagnosis not present

## 2016-05-08 DIAGNOSIS — R51 Headache: Secondary | ICD-10-CM | POA: Diagnosis not present

## 2016-05-08 DIAGNOSIS — R519 Headache, unspecified: Secondary | ICD-10-CM

## 2016-05-08 NOTE — Patient Instructions (Signed)

## 2016-05-08 NOTE — Progress Notes (Signed)
Subjective:    Patient ID: Tina [name] is a 60 y.o. female.  HPI     Huston Foley, MD, PhD [facility] [address], [address] [address] [place], [place] [address]  Dear Annabell Howells,   I saw your patient, Tina [name], upon your kind request in my neurologic clinic today for initial consultation of her sleep disorder, in particular, reevaluation of a prior diagnosis of OSA. The patient is unaccompanied today. As you know, Tina [name] is a 60 year old right-handed woman with an underlying medical history of osteopenia, hyperlipidemia, type 2 diabetes, hypertension, reflux disease, arthritis, status post left femoral neck fracture, status post hemiarthroplasty of left hip, COPD, smoking, and overweight state, who was diagnosed with obstructive sleep apneas several years ago and placed on CPAP therapy. Her machine is several years old and she has not used it in years. She has had difficult to control hypertension. Prior sleep study results are not available for my review today. I reviewed your office note from 12/20/2015. She does snore, per children, sometimes wakes herself up with gasping and snoring. Her Epworth sleepiness score is 10 out of 24 today, her fatigue score is 51 out of 63 today. She does not sleep well through the night. She has nocturia about twice per night. She goes to bed between 11 and 11:30 PM and wakeup time is around 7:30. She does not wake up rested. She does not typically take a scheduled nap but does dose off sometimes for a few minutes.  She has occasional morning headaches. She has neck pain. She has been seeing orthopedics for this. She is going to have injections into her neck next week.  She would be willing to get retested for sleep apnea and she would be willing to try CPAP again. She uses the CPAP only for a few weeks or a few months. She lives alone. She is single, has 2 grown children, is currently not employed. She worked for [employer] in [place].  She does not drink caffeine daily, she smokes half a pack per day, she does not drink alcohol. She denies restless leg symptoms or leg twitching at night. Compared to her sleep study several years ago she believes she has lost weight.  Her Past Medical History Is Significant For: Past Medical History:  Diagnosis Date  . Arthritis    "joints; fingers; shoulders; knees; back" (08/27/2012)  . Asthma   . Atrial fibrillation (HCC)   . Chronic back pain   . Fibromyalgia    "dx'd long time ago; before they really knew what it was" (112/02/2012)  . Flu   . GERD (gastroesophageal reflux disease)   . Hyperlipidemia   . Osteoporosis   . Scoliosis   . Sleep apnea    "have a mask but I don't wear it" (08/27/2012)  . Syncope and collapse 08/26/2012   "broke left hip" (08/27/2012)  . Type II diabetes mellitus (HCC)    "borderline" (08/27/2012)    Her Past Surgical History Is Significant For: Past Surgical History:  Procedure Laterality Date  . HIP ARTHROPLASTY  08/27/2012   Procedure: ARTHROPLASTY BIPOLAR HIP;  Surgeon: Velna Ochs, MD;  Location: [facility] OR;  Service: Orthopedics;  Laterality: Left;  . NO PAST SURGERIES      Her Family History Is Significant For: Family History  Problem Relation Age of Onset  . Breast cancer    . Throat cancer    . Lung cancer  Her Social History Is Significant For: Social History   Social History  . Marital status: Divorced    Spouse name: N/A  . Number of children: N/A  . Years of education: N/A   Social History Main Topics  . Smoking status: Current Every Day Smoker    Packs/day: 0.50    Years: 36.00    Types: Cigarettes  . Smokeless tobacco: Never Used  . Alcohol use No  . Drug use: No  . Sexual activity: No   Other Topics Concern  . None   Social History Narrative  . None    Her Allergies Are:  Allergies  Allergen Reactions  . Hydrocodone-Acetaminophen Palpitations    Other  reaction(s): Dizziness  chest pain  . Lisinopril Other (See Comments)  . Tramadol Other (See Comments)    "It makes my chest feel funny; I don't like to take that"  :   Her Current Medications Are:  Outpatient Encounter Prescriptions as of 05/08/2016  Medication Sig  . alendronate (FOSAMAX) 70 MG tablet Take 70 mg by mouth.  Marland Kitchen atenolol (TENORMIN) 100 MG tablet Take 100 mg by mouth.  . gabapentin (NEURONTIN) 300 MG capsule TAKE ONE CAPSULE AT BEDTIME FOR 5 DAYS THEN 1 TWICE A DAY FOR 5 DAYS THEN 1 THREE TIMES A DAY  . glipiZIDE (GLUCOTROL XL) 10 MG 24 hr tablet Take 10 mg by mouth daily.  Marland Kitchen glipiZIDE (GLUCOTROL XL) 10 MG 24 hr tablet Take 10 mg by mouth.  . losartan (COZAAR) 50 MG tablet Take 50 mg by mouth.  . lovastatin (MEVACOR) 20 MG tablet Take 20 mg by mouth at bedtime.  . lovastatin (MEVACOR) 20 MG tablet Take 20 mg by mouth.  . pantoprazole (PROTONIX) 20 MG tablet 20 mg daily.  . potassium chloride (K-DUR) 10 MEQ tablet Take 10 mEq by mouth.  . predniSONE (DELTASONE) 10 MG tablet Take 3 tabs by mouth daily X 1 day; then 2 tabs by mouth daily X 2 days and then 1 tab by mouth X1 day and stop prednisone  . [DISCONTINUED] acebutolol (SECTRAL) 400 MG capsule Take 400 mg by mouth 2 (two) times daily.  . [DISCONTINUED] albuterol-ipratropium (COMBIVENT) 18-103 MCG/ACT inhaler Inhale 2 puffs into the lungs every 6 (six) hours as needed. For breathing  . [DISCONTINUED] aspirin EC 325 MG tablet Take 1 tablet (325 mg total) by mouth 2 (two) times daily after a meal.  . [DISCONTINUED] desvenlafaxine (PRISTIQ) 50 MG 24 hr tablet Take 50 mg by mouth daily.  . [DISCONTINUED] magnesium oxide (MAG-OX) 400 MG tablet Take 400 mg by mouth daily.  . [DISCONTINUED] medroxyPROGESTERone (DEPO-PROVERA) 150 MG/ML injection Inject 150 mg into the muscle every 3 (three) months.  . [DISCONTINUED] metFORMIN (GLUCOPHAGE) 500 MG tablet Take 500 mg by mouth 2 (two) times daily with a meal.  . [DISCONTINUED]  omeprazole (PRILOSEC) 20 MG capsule Take 2 capsules (40 mg total) by mouth 2 (two) times daily.  . [DISCONTINUED] oxyCODONE-acetaminophen (PERCOCET/ROXICET) 5-325 MG per tablet Take 1-2 tablets by mouth every 6 (six) hours as needed for pain.  . [DISCONTINUED] senna-docusate (SENOKOT-S) 8.6-50 MG per tablet Take 1 tablet by mouth at bedtime as needed for constipation (hold for diarrhea).  . [DISCONTINUED] sucralfate (CARAFATE) 1 G tablet Take 1 g by mouth 4 (four) times daily.   No facility-administered encounter medications on file as of 05/08/2016.   :  Review of Systems:  Out of a complete 14 point review of systems, all are reviewed and negative with  the exception of these symptoms as listed below:  Review of Systems  Neurological: Positive for numbness.       Pt reports that she has had a sleep study before "years ago" and was diagnosed with sleep apnea and treated with CPAP. However, she is no longer on CPAP. Epworth Sleepiness Scale 0= would never doze 1= slight chance of dozing 2= moderate chance of dozing 3= high chance of dozing  Sitting and reading:1 Watching TV:2 Sitting inactive in a public place (ex. Theater or meeting):1 As a passenger in a car for an hour without a break:2 Lying down to rest in the afternoon:1 Sitting and talking to someone:1 Sitting quietly after lunch (no alcohol):1 In a car, while stopped in traffic:1 Total: 10   Psychiatric/Behavioral: Positive for sleep disturbance.       Insomnia    Objective:  Neurologic Exam  Physical Exam Physical Examination:   Vitals:   05/08/16 1529  BP: (!) 180/102  Pulse: 88  Resp: 20   General Examination: The patient is a very pleasant 60 y.o. female in no acute distress. She appears well-developed and well-nourished and well groomed.   HEENT: Normocephalic, atraumatic, pupils are equal, round and reactive to light and accommodation. Funduscopic exam is normal with sharp disc margins noted. Extraocular  tracking is good without limitation to gaze excursion or nystagmus noted. Normal smooth pursuit is noted. Hearing is grossly intact. Tympanic membranes are clear bilaterally. Face is symmetric with normal facial animation and normal facial sensation. Speech is clear with no dysarthria noted. There is no hypophonia. There is no lip, neck/head, jaw or voice tremor. Neck is supple with full range of passive and active motion. There are no carotid bruits on auscultation. Oropharynx exam reveals: mild mouth dryness, adequate dental hygiene and moderate airway crowding, due to larger uvula, tonsil of 2+ in place and longer tongue. Mallampati is class II. Tongue protrudes centrally and palate elevates symmetrically. Neck size is 14.75 inches. She has a Mild overbite.   Chest: Clear to auscultation without wheezing, rhonchi or crackles noted.  Heart: S1+S2+0, regular and normal without murmurs, rubs or gallops noted.   Abdomen: Soft, non-tender and non-distended with normal bowel sounds appreciated on auscultation.  Extremities: There is no pitting edema in the distal lower extremities bilaterally. Pedal pulses are intact.  Skin: Warm and dry without trophic changes noted. There are no varicose veins.  Musculoskeletal: exam reveals no obvious joint deformities, tenderness or joint swelling or erythema.   Neurologically:  Mental status: The patient is awake, alert and oriented in all 4 spheres. Her immediate and remote memory, attention, language skills and fund of knowledge are appropriate. There is no evidence of aphasia, agnosia, apraxia or anomia. Speech is clear with normal prosody and enunciation. Thought process is linear. Mood is normal and affect is normal.  Cranial nerves II - XII are as described above under HEENT exam. In addition: shoulder shrug is normal with equal shoulder height noted. Motor exam: Normal bulk, strength and tone is noted. There is no drift, tremor or rebound. Romberg is  negative. Reflexes are 2+ throughout. Babinski: Toes are flexor bilaterally. Fine motor skills and coordination: intact with normal finger taps, normal hand movements, normal rapid alternating patting, normal foot taps and normal foot agility.  Cerebellar testing: No dysmetria or intention tremor on finger to nose testing. Heel to shin is unremarkable bilaterally. There is no truncal or gait ataxia.  Sensory exam: intact to light touch, pinprick, vibration, temperature sense  in the upper and lower extremities with the exception of decreased pinprick sensation in the fingertips.  Gait, station and balance: She stands easily. No veering to one side is noted. No leaning to one side is noted. Posture is age-appropriate and stance is narrow based. Gait shows normal stride length and normal pace. No problems turning are noted. Tandem walk is unremarkable. Intact toe and heel stance is noted.              Assessment and Plan:   In summary, Tina [name] is a very pleasant 60 y.o.-year old female with an underlying medical history of osteopenia, hyperlipidemia, type 2 diabetes, hypertension, reflux disease, arthritis, status post left femoral neck fracture, status post hemiarthroplasty of left hip, COPD, smoking, and overweight state, who was diagnosed with obstructive sleep apnea several years ago and placed on CPAP therapy. She has not used CPAP in years. She has difficult to control hypertension and her blood pressure is indeed elevated today which is not unusual for her recently. She reports that she is compliant with her blood pressure medications. Her history and physical exam are indeed concerning for ongoing underlying obstructive sleep apnea (OSA). I had a long chat with the patient about my findings and the diagnosis of OSA, its prognosis and treatment options. We talked about medical treatments, surgical interventions and non-pharmacological approaches. I explained in particular the risks and  ramifications of untreated moderate to severe OSA, especially with respect to developing cardiovascular disease down the Road, including congestive heart failure, difficult to treat hypertension, cardiac arrhythmias, or stroke. Even type 2 diabetes has, in part, been linked to untreated OSA. Symptoms of untreated OSA include daytime sleepiness, memory problems, mood irritability and mood disorder such as depression and anxiety, lack of energy, as well as recurrent headaches, especially morning headaches. We talked about smoking cessation and trying to maintain a  healthy lifestyle in general, as well as the importance of weight control. I encouraged the patient to eat healthy, exercise daily and keep well hydrated, to keep a scheduled bedtime and wake time routine, to not skip any meals and eat healthy snacks in between meals. I advised the patient not to drive when feeling sleepy. I recommended the following at this time: sleep study with potential positive airway pressure titration. (We will score hypopneas at 4% and split the sleep study into diagnostic and treatment portion, if the estimated. 2 hour AHI is >15/h).   I explained the sleep test procedure to the patient and also outlined possible surgical and non-surgical treatment options of OSA, including the use of a custom-made dental device (which would require a referral to a specialist dentist or oral surgeon), upper airway surgical options, such as pillar implants, radiofrequency surgery, tongue base surgery, and UPPP (which would involve a referral to an ENT surgeon). Rarely, jaw surgery such as mandibular advancement may be considered.  I also explained the CPAP treatment option to the patient, who indicated that she would be willing to try CPAP if the need arises. I explained the importance of being compliant with PAP treatment, not only for insurance purposes but primarily to improve Her symptoms, and for the patient's long term health benefit,  including to reduce Her cardiovascular risks. I answered all her questions today and the patient was in agreement. I would like to see her back after the sleep study is completed and encouraged her to call with any interim questions, concerns, problems or updates.   Thank you very much for  allowing me to participate in the care of this nice patient. If I can be of any further assistance to you please do not hesitate to call me at [phone].  Sincerely,   Star Age, MD, PhD

## 2016-05-21 ENCOUNTER — Ambulatory Visit (INDEPENDENT_AMBULATORY_CARE_PROVIDER_SITE_OTHER): Payer: Medicaid Other | Admitting: Neurology

## 2016-05-21 DIAGNOSIS — G4720 Circadian rhythm sleep disorder, unspecified type: Secondary | ICD-10-CM

## 2016-05-21 DIAGNOSIS — G4733 Obstructive sleep apnea (adult) (pediatric): Secondary | ICD-10-CM | POA: Diagnosis not present

## 2016-05-28 ENCOUNTER — Telehealth: Payer: Self-pay | Admitting: Neurology

## 2016-05-28 NOTE — Telephone Encounter (Signed)
Patient referred by NP, Ms. Shaffer, seen by me on 05/08/16, diagnostic PSG on 05/21/16.   Please call and notify the patient that the recent sleep study showed overall mild obstructive sleep apnea, but she slept VERY LITTLE, a little over 2 h. Please inform patient that I would like to go over the details of the study during a follow up appointment and Rx options. Arrange a followup appointment. Also, route or fax report to PCP and referring MD, if other than PCP.  Once you have spoken to patient, you can close this encounter.   Thanks,  Huston FoleySaima Emonnie Cannady, MD, PhD [facility]

## 2016-05-29 NOTE — Telephone Encounter (Signed)
appt scheduled for 06/12/16 @ 3pm

## 2016-05-29 NOTE — Telephone Encounter (Signed)
I LM on VM (per DPR) with results and recommendations below. Asked patient to call back and make f/u appt with Dr. Frances FurbishAthar.

## 2016-06-10 ENCOUNTER — Other Ambulatory Visit: Payer: Self-pay | Admitting: Orthopedic Surgery

## 2016-06-12 ENCOUNTER — Encounter: Payer: Self-pay | Admitting: Neurology

## 2016-06-12 ENCOUNTER — Ambulatory Visit (INDEPENDENT_AMBULATORY_CARE_PROVIDER_SITE_OTHER): Payer: Medicaid Other | Admitting: Neurology

## 2016-06-12 VITALS — BP 163/82 | HR 67 | Resp 16 | Ht 66.0 in | Wt 154.0 lb

## 2016-06-12 DIAGNOSIS — M5030 Other cervical disc degeneration, unspecified cervical region: Secondary | ICD-10-CM | POA: Diagnosis not present

## 2016-06-12 DIAGNOSIS — G4733 Obstructive sleep apnea (adult) (pediatric): Secondary | ICD-10-CM | POA: Diagnosis not present

## 2016-06-12 NOTE — Patient Instructions (Addendum)
We will set you up at home with a so called autoPAP machine for treatment of your overall mild obstructive sleep apnea. I think it will help you sleep better and feel less tired and fatigued and may help your morning headaches.   I will see you back in about 3 months.

## 2016-06-12 NOTE — Progress Notes (Signed)
Subjective:    [name] ID: Tina [name] is a 60 y.o. female.  HPI     Interim history:   Tina [name] is a 60 year old right-handed woman with an underlying medical history of osteopenia, hyperlipidemia, type 2 diabetes, hypertension, reflux disease, arthritis, status post left femoral neck fracture, status post hemiarthroplasty of left hip, COPD, smoking, and overweight state, who presents for follow-up consultation of Tina [name] sleep disordered breathing, after Tina [name] recent sleep study. Tina [name] is unaccompanied today. Tina [name] first met Tina [name] on 05/08/2016 at Tina request of Tina [name] primary care provider, at which time Tina [name] reported a prior diagnosis of obstructive sleep apnea and CPAP therapy for years but no recent use of CPAP and a history of recent difficult to control hypertension. Tina [name] invited Tina [name] back for sleep study. Tina [name] had a baseline sleep study on 05/21/2016. Tina [name] went over Tina [name] test results with Tina [name] in detail today. Sleep efficiency was markedly reduced at 29.3% with long latency to sleep of 210.5 minutes and wake after sleep onset of 96 minutes. Tina [name] had an increased percentage of stage II sleep, absence of slow-wave sleep and REM sleep at 31.9% with a REM latency of 57 minutes. Tina [name] had no significant PLMS, EKG or EEG changes. Tina [name] had intermittent snoring. Total AHI was 6.6 per hour, rising to 19.3 per hour during REM sleep. Average oxygen saturation was 93%, nadir was 87%.   Today, 06/12/2016: Tina [name] reports Doing about Tina same, has occasional morning headaches, still has tingling and abnormal sensations in Tina [name] fingertips and hands. Tina [name] has been scheduled for cervical spine surgery on 06/25/2016. Tina [name] also follows with orthopedics for Tina [name] left shoulder pain. Tina [name] indicates concern with CPAP therapy in Tina past as Tina [name] felt uncomfortable with Tina mask. Tina [name] would be willing to try nasal pillows.  Previously:   05/08/2016: Tina [name] was diagnosed with obstructive sleep apneas several years ago and placed on CPAP therapy.  Tina [name] machine is several years old and Tina [name] has not used it in years. Tina [name] has had difficult to control hypertension. Prior sleep study results are not available for my review today. Tina [name] reviewed your office note from 12/20/2015. Tina [name] does snore, per children, sometimes wakes herself up with gasping and snoring. Tina [name] Epworth sleepiness score is 10 out of 24 today, Tina [name] fatigue score is 51 out of 63 today. Tina [name] does not sleep well through Tina night. Tina [name] has nocturia about twice per night. Tina [name] goes to bed between 11 and 11:30 PM and wakeup time is around 7:30. Tina [name] does not wake up rested. Tina [name] does not typically take a scheduled nap but does dose off sometimes for a few minutes.  Tina [name] has occasional morning headaches. Tina [name] has neck pain. Tina [name] has been seeing orthopedics for this. Tina [name] is going to have injections into Tina [name] neck next week.  Tina [name] would be willing to get retested for sleep apnea and Tina [name] would be willing to try CPAP again. Tina [name] uses Tina CPAP only for a few weeks or a few months. Tina [name] lives alone. Tina [name] is single, has 2 grown children, is currently not employed. Tina [name] worked for [employer] in [place].  Tina [name] does not drink caffeine daily, Tina [name] smokes half a pack per day, Tina [name] does not drink alcohol. Tina [name] denies restless leg symptoms or leg twitching at night. Compared to Tina [name] sleep study several years ago Tina [name] believes Tina [name] has lost weight  Tina [name] Past Medical History Is Significant For: Past Medical History:  Diagnosis Date  . Arthritis    "joints;  fingers; shoulders; knees; back" (08/27/2012)  . Asthma   . Atrial fibrillation ([place])   . Chronic back pain   . Fibromyalgia    "dx'd long time ago; before they really knew what it was" (112/02/2012)  . Flu   . GERD (gastroesophageal reflux disease)   . Hyperlipidemia   . Osteoporosis   . Scoliosis   . Sleep apnea    "have a mask but Tina [name] don't wear it" (08/27/2012)  . Syncope and collapse 08/26/2012   "broke left hip" (08/27/2012)  . Type II diabetes mellitus  ([place])    "borderline" (08/27/2012)    Tina [name] Past Surgical History Is Significant For: Past Surgical History:  Procedure Laterality Date  . HIP ARTHROPLASTY  08/27/2012   Procedure: ARTHROPLASTY BIPOLAR HIP;  Surgeon: Hessie Dibble, MD;  Location: [facility];  Service: Orthopedics;  Laterality: Left;  . NO PAST SURGERIES      Tina [name] Family History Is Significant For: Family History  Problem Relation Age of Onset  . Breast cancer    . Throat cancer    . Lung cancer      Tina [name] Social History Is Significant For: Social History   Social History  . Marital status: Divorced    Spouse name: N/A  . Number of children: N/A  . Years of education: N/A   Social History Main Topics  . Smoking status: Current Every Day Smoker    Packs/day: 0.50    Years: 36.00    Types: Cigarettes  . Smokeless tobacco: Never Used  . Alcohol use No  . Drug use: No  . Sexual activity: No   Other Topics Concern  . None   Social History Narrative  . None    Tina [name] Allergies Are:  Allergies  Allergen Reactions  . Hydrocodone-Acetaminophen Palpitations    Other reaction(s): Dizziness  chest pain  . Lisinopril Other (See Comments)  . Tramadol Other (See Comments)    "It makes my chest feel funny; Tina [name] don't like to take that"  :   Tina [name] Current Medications Are:  Outpatient Encounter Prescriptions as of 06/12/2016  Medication Sig  . alendronate (FOSAMAX) 70 MG tablet Take 70 mg by mouth.  Marland Kitchen atenolol (TENORMIN) 100 MG tablet Take 100 mg by mouth.  . gabapentin (NEURONTIN) 300 MG capsule TAKE ONE CAPSULE AT BEDTIME FOR 5 DAYS THEN 1 TWICE A DAY FOR 5 DAYS THEN 1 THREE TIMES A DAY  . glipiZIDE (GLUCOTROL XL) 10 MG 24 hr tablet Take 10 mg by mouth daily.  Marland Kitchen glipiZIDE (GLUCOTROL XL) 10 MG 24 hr tablet Take 10 mg by mouth.  . losartan (COZAAR) 100 MG tablet Take 100 mg by mouth daily.  Marland Kitchen lovastatin (MEVACOR) 20 MG tablet Take 20 mg by mouth.  . pantoprazole (PROTONIX) 20 MG tablet 20 mg daily.  . [DISCONTINUED]  losartan (COZAAR) 50 MG tablet Take 50 mg by mouth.  . [DISCONTINUED] lovastatin (MEVACOR) 20 MG tablet Take 20 mg by mouth at bedtime.  . [DISCONTINUED] potassium chloride (K-DUR) 10 MEQ tablet Take 10 mEq by mouth.  . [DISCONTINUED] predniSONE (DELTASONE) 10 MG tablet Take 3 tabs by mouth daily X 1 day; then 2 tabs by mouth daily X 2 days and then 1 tab by mouth X1 day and stop prednisone   No facility-administered encounter medications on file as of 06/12/2016.   :  Review of Systems:  Out of a complete 14 point review of systems, all are reviewed and negative with Tina exception of these  symptoms as listed below:  Review of Systems  Neurological:       [name] reports that Tina [name] is having neck surgery on Oct 4. No new concerns.      Objective:  Neurologic Exam  Physical Exam Physical Examination:   Vitals:   06/12/16 1448  BP: (!) 163/82  Pulse: 67  Resp: 16   General Examination: Tina [name] is a very pleasant 60 y.o. female in no acute distress. Tina [name] appears well-developed and well-nourished and well groomed.   HEENT: Normocephalic, atraumatic, pupils are equal, round and reactive to light and accommodation. Extraocular tracking is good without limitation to gaze excursion or nystagmus noted. Normal smooth pursuit is noted. Hearing is grossly intact. Face is symmetric with normal facial animation and normal facial sensation. Speech is clear with no dysarthria noted. There is no hypophonia. There is no lip, neck/head, jaw or voice tremor. Neck is supple with full range of passive and active motion. There are no carotid bruits on auscultation. Oropharynx exam reveals: mild mouth dryness, adequate dental hygiene and moderate airway crowding, due to larger uvula, tonsils of 2+ in place and longer tongue. Mallampati is class II. Tongue protrudes centrally and palate elevates symmetrically.   Chest: Clear to auscultation without wheezing, rhonchi or crackles noted.  Heart: S1+S2+0,  regular and normal without murmurs, rubs or gallops noted.   Abdomen: Soft, non-tender and non-distended with normal bowel sounds appreciated on auscultation.  Extremities: There is no pitting edema in Tina distal lower extremities bilaterally. Pedal pulses are intact.  Skin: Warm and dry without trophic changes noted. There are no varicose veins.  Musculoskeletal: exam reveals no obvious joint deformities, tenderness or joint swelling or erythema, With Tina exception of decreased range of motion in Tina [name] left shoulder.   Neurologically:  Mental status: Tina [name] is awake, alert and oriented in all 4 spheres. Tina [name] immediate and remote memory, attention, language skills and fund of knowledge are appropriate. There is no evidence of aphasia, agnosia, apraxia or anomia. Speech is clear with normal prosody and enunciation. Thought process is linear. Mood is normal and affect is normal.  Cranial nerves II - XII are as described above under HEENT exam. In addition: shoulder shrug is normal with equal shoulder height noted. Motor exam: Normal bulk, strength and tone is noted, except grip weakness, R>L. There is no drift, tremor or rebound. Romberg is negative. Reflexes are 2+ throughout. Fine motor skills and coordination: intact with normal finger taps, normal hand movements, normal rapid alternating patting, normal foot taps and normal foot agility.  Cerebellar testing: No dysmetria or intention tremor on finger to nose testing. Heel to shin is unremarkable bilaterally. There is no truncal or gait ataxia.  Sensory exam: decreased sensation in Tina fingers.  Gait, station and balance: Tina [name] stands easily. No veering to one side is noted. No leaning to one side is noted. Posture is age-appropriate and stance is narrow based. Gait shows normal stride length and normal pace. No problems turning are noted. Tandem walk is unremarkable. .              Assessment and Plan:   In summary, RILYNN HABEL is a very  pleasant 60 year old female with an underlying medical history of osteopenia, hyperlipidemia, type 2 diabetes, hypertension, reflux disease, arthritis, status post left femoral neck fracture, status post hemiarthroplasty of left hip, COPD, smoking, and overweight state, who was diagnosed with obstructive sleep apnea several years ago and placed on CPAP therapy. Tina [name] has  not used CPAP in years. Tina [name] recently had a baseline sleep study On 05/21/2016 which unfortunately showed only limited sleep but Tina [name] did have mild to moderate obstructive sleep disordered breathing during that test. What with Tina [name] history and Tina [name] sleep-related complaints, Tina [name] suggested we proceed with AutoPap therapy at home. Tina [name] would be willing to try this. Tina [name] would like nasal pillows as Tina [name] reports claustrophobia with anything big on Tina [name] face. Tina [name] physical exam is stable, Tina [name] does have evidence of mild grip strength weakness and degenerative spine disease for which Tina [name] is scheduled for neck surgery next month. Tina [name] also has left shoulder problems for which Tina [name] has been seeing orthopedics. Tina [name] explained AutoPap ther25apy to Tina [name]. Tina [name] would be willing to proceed. Tina [name] has tried CPAP in Tina past but did not stay on treatment long. Tina [name] is encouraged to be compliant with AutoPap therapy as it may help Tina [name] blood pressure values, help Tina [name] obtain more consolidated and better restful sleep and also help Tina [name] morning headaches. Tina [name] will see Tina [name] back in about 3 months, sooner as needed. Answered all Tina [name] questions today and Tina [name] was in agreement  Tina [name] spent 25 minutes in total face-to-face time with Tina [name], more than 50% of which was spent in counseling and coordination of care, reviewing test results, reviewing medication and discussing or reviewing Tina diagnosis of OSA, its prognosis and treatment options.

## 2016-06-17 ENCOUNTER — Other Ambulatory Visit: Payer: Self-pay | Admitting: Family Medicine

## 2016-06-17 DIAGNOSIS — Z1231 Encounter for screening mammogram for malignant neoplasm of breast: Secondary | ICD-10-CM

## 2016-06-18 ENCOUNTER — Encounter (HOSPITAL_COMMUNITY): Payer: Self-pay

## 2016-06-19 ENCOUNTER — Encounter (HOSPITAL_COMMUNITY): Payer: Self-pay

## 2016-06-19 ENCOUNTER — Encounter (HOSPITAL_COMMUNITY)
Admission: RE | Admit: 2016-06-19 | Discharge: 2016-06-19 | Disposition: A | Discharge status: Home / Self Care | Payer: Medicaid Other | Source: Ambulatory Visit | Attending: Orthopedic Surgery | Admitting: Orthopedic Surgery

## 2016-06-19 DIAGNOSIS — Z01812 Encounter for preprocedural laboratory examination: Secondary | ICD-10-CM | POA: Diagnosis not present

## 2016-06-19 DIAGNOSIS — G9589 Other specified diseases of spinal cord: Secondary | ICD-10-CM | POA: Diagnosis not present

## 2016-06-19 HISTORY — DX: Weakness: R53.1

## 2016-06-19 HISTORY — DX: Effusion, unspecified joint: M25.40

## 2016-06-19 HISTORY — DX: Personal history of colon polyps, unspecified: Z86.0100

## 2016-06-19 HISTORY — DX: Cardiac murmur, unspecified: R01.1

## 2016-06-19 HISTORY — DX: Essential (primary) hypertension: I10

## 2016-06-19 HISTORY — DX: Chronic obstructive pulmonary disease, unspecified: J44.9

## 2016-06-19 HISTORY — DX: Pneumonia, unspecified organism: J18.9

## 2016-06-19 HISTORY — DX: Cervicalgia: M54.2

## 2016-06-19 HISTORY — DX: Personal history of other diseases of the respiratory system: Z87.09

## 2016-06-19 HISTORY — DX: Other chronic pain: G89.29

## 2016-06-19 HISTORY — DX: Palpitations: R00.2

## 2016-06-19 HISTORY — DX: Personal history of colonic polyps: Z86.010

## 2016-06-19 LAB — SURGICAL PCR SCREEN
MRSA, PCR: NEGATIVE
STAPHYLOCOCCUS AUREUS: NEGATIVE

## 2016-06-19 LAB — TYPE AND SCREEN
ABO/RH(D): O POS
ANTIBODY SCREEN: NEGATIVE

## 2016-06-19 LAB — APTT: APTT: 33 s (ref 24–36)

## 2016-06-19 LAB — CBC WITH DIFFERENTIAL/PLATELET
BASOS ABS: 0 10*3/uL (ref 0.0–0.1)
BASOS PCT: 0 %
EOS ABS: 0.1 10*3/uL (ref 0.0–0.7)
EOS PCT: 1 %
HCT: 42.9 % (ref 36.0–46.0)
Hemoglobin: 14 g/dL (ref 12.0–15.0)
Lymphocytes Relative: 47 %
Lymphs Abs: 3.5 10*3/uL (ref 0.7–4.0)
MCH: 32.1 pg (ref 26.0–34.0)
MCHC: 32.6 g/dL (ref 30.0–36.0)
MCV: 98.4 fL (ref 78.0–100.0)
MONO ABS: 0.4 10*3/uL (ref 0.1–1.0)
MONOS PCT: 5 %
Neutro Abs: 3.5 10*3/uL (ref 1.7–7.7)
Neutrophils Relative %: 47 %
PLATELETS: 182 10*3/uL (ref 150–400)
RBC: 4.36 MIL/uL (ref 3.87–5.11)
RDW: 13.8 % (ref 11.5–15.5)
WBC: 7.4 10*3/uL (ref 4.0–10.5)

## 2016-06-19 LAB — COMPREHENSIVE METABOLIC PANEL
ALBUMIN: 4.1 g/dL (ref 3.5–5.0)
ALT: 19 U/L (ref 14–54)
ANION GAP: 7 (ref 5–15)
AST: 21 U/L (ref 15–41)
Alkaline Phosphatase: 56 U/L (ref 38–126)
BILIRUBIN TOTAL: 1.1 mg/dL (ref 0.3–1.2)
BUN: 8 mg/dL (ref 6–20)
CHLORIDE: 110 mmol/L (ref 101–111)
CO2: 25 mmol/L (ref 22–32)
CREATININE: 1 mg/dL (ref 0.44–1.00)
Calcium: 9.2 mg/dL (ref 8.9–10.3)
GFR calc Af Amer: >60 mL/min (ref >60)
GFR calc non Af Amer: >60 mL/min (ref >60)
GLUCOSE: 95 mg/dL (ref 65–99)
POTASSIUM: 3.5 mmol/L (ref 3.5–5.1)
Sodium: 142 mmol/L (ref 135–145)
Total Protein: 6.5 g/dL (ref 6.5–8.1)

## 2016-06-19 LAB — URINALYSIS, ROUTINE W REFLEX MICROSCOPIC
BILIRUBIN URINE: NEGATIVE
GLUCOSE, UA: NEGATIVE mg/dL
HGB URINE DIPSTICK: NEGATIVE
KETONES UR: NEGATIVE mg/dL
LEUKOCYTES UA: NEGATIVE
Nitrite: NEGATIVE
PH: 6.5 (ref 5.0–8.0)
PROTEIN: NEGATIVE mg/dL
Specific Gravity, Urine: 1.005 (ref 1.005–1.030)

## 2016-06-19 LAB — PROTIME-INR
INR: 0.97
PROTHROMBIN TIME: 12.9 s (ref 11.4–15.2)

## 2016-06-19 LAB — GLUCOSE, CAPILLARY: GLUCOSE-CAPILLARY: 127 mg/dL — AB (ref 65–99)

## 2016-06-19 MED ORDER — POVIDONE-IODINE 7.5 % EX SOLN
Freq: Once | CUTANEOUS | Status: DC
Start: 2016-06-19 — End: 2016-06-20

## 2016-06-19 NOTE — Progress Notes (Addendum)
Cardiologist is Dr.Bode with 1st visit being in Mar 2017. Was told didn't have to come back unless she felt like she needed to. Visit under care everywhere.Went d/t palpitations  Medical Md is [name] in [place]  Echo denies  Stress test done > 5 yrs ago  Heart cath denies  Sleep study in epic from 2017  CXR in epic from 09-21-15  [facility]

## 2016-06-19 NOTE — Pre-Procedure Instructions (Signed)
Tina [name]  06/19/2016      [id number] - [place], [place] - [address] [place] [address] Phone: [phone] Fax: [phone]    Your procedure is scheduled on Wed, Oct 4 @ 8:30 AM  Report to [facility] Admitting at 6:30 AM  Call this number if you have problems the morning of surgery:  [phone]   Remember:  Do not eat food or drink liquids after midnight.  Take these medicines the morning of surgery with A SIP OF WATER Atenolol(Tenormin) and Pantoprazole(Protonix)             No Goody's,BC's,Aleve,Aspirin,Ibuprofen.Motrin, Fish Oil,or any Herbal Medications.      How to Manage Your Diabetes Before and After Surgery  Why is it important to control my blood sugar before and after surgery? . Improving blood sugar levels before and after surgery helps healing and can limit problems. . A way of improving blood sugar control is eating a healthy diet by: o  Eating less sugar and carbohydrates o  Increasing activity/exercise o  Talking with your doctor about reaching your blood sugar goals . High blood sugars (greater than 180 mg/dL) can raise your risk of infections and slow your recovery, so you will need to focus on controlling your diabetes during the weeks before surgery. . Make sure that the doctor who takes care of your diabetes knows about your planned surgery including the date and location.  How do I manage my blood sugar before surgery? . Check your blood sugar at least 4 times a day, starting 2 days before surgery, to make sure that the level is not too high or low. o Check your blood sugar the morning of your surgery when you wake up and every 2 hours until you get to the Short Stay unit. . If your blood sugar is less than 70 mg/dL, you will need to treat for low blood sugar: o Do not take insulin. o Treat a low blood sugar (less than 70 mg/dL) with  cup of clear juice (cranberry or apple), 4 glucose  tablets, OR glucose gel. o Recheck blood sugar in 15 minutes after treatment (to make sure it is greater than 70 mg/dL). If your blood sugar is not greater than 70 mg/dL on recheck, call [phone] for further instructions. . Report your blood sugar to the short stay nurse when you get to Short Stay.  . If you are admitted to the hospital after surgery: o Your blood sugar will be checked by the staff and you will probably be given insulin after surgery (instead of oral diabetes medicines) to make sure you have good blood sugar levels. o The goal for blood sugar control after surgery is 80-180 mg/dL.              WHAT DO I DO ABOUT MY DIABETES MEDICATION?   Marland Kitchen Do not take oral diabetes medicines (pills) the morning of surgery.       . The day of surgery, do not take other diabetes injectables, including Byetta (exenatide), Bydureon (exenatide ER), Victoza (liraglutide), or Trulicity (dulaglutide).  . If your CBG is greater than 220 mg/dL, you may take  of your sliding scale (correction) dose of insulin.  Other Instructions:          Patient Signature:  Date:   Nurse Signature:  Date:   Reviewed and Endorsed by [facility] Patient Education Committee, August 2015  Do not wear jewelry, make-up or nail polish.  Do not wear lotions, powders,perfumes, or deoderant.  Do not shave 48 hours prior to surgery.    Do not bring valuables to the hospital.  [facility] is not responsible for any belongings or valuables.  Contacts, dentures or bridgework may not be worn into surgery.  Leave your suitcase in the car.  After surgery it may be brought to your room.  For patients admitted to the hospital, discharge time will be determined by your treatment team.  Patients discharged the day of surgery will not be allowed to drive home.    [facility] - Preparing for Surgery  Before surgery, you can play an important role.  Because skin is not sterile, your  skin needs to be as free of germs as possible.  You can reduce the number of germs on you skin by washing with CHG (chlorahexidine gluconate) soap before surgery.  CHG is an antiseptic cleaner which kills germs and bonds with the skin to continue killing germs even after washing.  Please DO NOT use if you have an allergy to CHG or antibacterial soaps.  If your skin becomes reddened/irritated stop using the CHG and inform your nurse when you arrive at Short Stay.  Do not shave (including legs and underarms) for at least 48 hours prior to the first CHG shower.  You may shave your face.  Please follow these instructions carefully:   1.  Shower with CHG Soap the night before surgery and the                                morning of Surgery.  2.  If you choose to wash your hair, wash your hair first as usual with your       normal shampoo.  3.  After you shampoo, rinse your hair and body thoroughly to remove the                      Shampoo.  4.  Use CHG as you would any other liquid soap.  You can apply chg directly       to the skin and wash gently with scrungie or a clean washcloth.  5.  Apply the CHG Soap to your body ONLY FROM THE NECK DOWN.        Do not use on open wounds or open sores.  Avoid contact with your eyes,       ears, mouth and genitals (private parts).  Wash genitals (private parts)       with your normal soap.  6.  Wash thoroughly, paying special attention to the area where your surgery        will be performed.  7.  Thoroughly rinse your body with warm water from the neck down.  8.  DO NOT shower/wash with your normal soap after using and rinsing off       the CHG Soap.  9.  Pat yourself dry with a clean towel.            10.  Wear clean pajamas.            11.  Place clean sheets on your bed the night of your first shower and do not        sleep with pets.  Day of Surgery  Do not apply any lotions/deoderants the morning of surgery.  Please wear clean clothes to the  hospital/surgery center.     Please read over the following fact sheets that you were given. Pain Booklet, MRSA Information and Surgical Site Infection Prevention

## 2016-06-20 ENCOUNTER — Encounter (HOSPITAL_COMMUNITY): Payer: Self-pay | Admitting: Emergency Medicine

## 2016-06-20 LAB — HEMOGLOBIN A1C
Hgb A1c MFr Bld: 5.9 % — ABNORMAL HIGH (ref 4.8–5.6)
Mean Plasma Glucose: 123 mg/dL

## 2016-06-20 NOTE — Progress Notes (Signed)
Anesthesia Chart Review:  Pt is a 60 year old female scheduled for C3-4, C4-5, C5-6 ACDF on 06/25/2016 with Estill BambergMark Dumonski, MD.   - PCP is Nanetta BattyMari-Jane Shaffer, FNP.  - Was evaluated by cardiologist Moss McWeeranun Bode, MD 12/13/15 (notes in care everywhere) for palpitations and chest pain. Dr. Erline LevineBode felt chest pain was atypical but recommended stress test as pt has multiple cardiac risk factors.  Pt declined stress test.   PMH includes:  HTN, palpitations, DM, hyperlipidemia, OSA, COPD, GERD. Current smoker. BMI 25. S/p L hip hemiarthroplasty 08/27/12.   Medications include: atenolol, glipizide, losartan, lovastatin, protonix  Preoperative labs reviewed.  HgbA1c 5.9, glucose 95  EKG 05/19/16: NSR. Possible LAE. Septal infarct. Poor R wave progression. Appears stable when compared to EKG 08/28/12.   Reviewed case with Dr. Aleene DavidsonE. Fitzgerald.  Pt will need stress test prior to surgery. Left voicemail for Carla in Dr. Marshell Levanumonski's office about this.   Rica Mastngela Shaylea Ucci, FNP-BC [facility]iology Phone: [phone] 06/20/2016 4:21 PM

## 2016-06-25 ENCOUNTER — Ambulatory Visit (HOSPITAL_COMMUNITY): Admission: RE | Admit: 2016-06-25 | Payer: Medicaid Other | Source: Ambulatory Visit | Admitting: Orthopedic Surgery

## 2016-06-25 ENCOUNTER — Encounter (HOSPITAL_COMMUNITY): Admission: RE | Payer: Self-pay | Source: Ambulatory Visit

## 2016-06-25 SURGERY — ANTERIOR CERVICAL DECOMPRESSION/DISCECTOMY FUSION 3 LEVELS
Anesthesia: General

## 2016-07-01 ENCOUNTER — Telehealth: Payer: Self-pay | Admitting: Neurology

## 2016-07-01 NOTE — Telephone Encounter (Signed)
I called patient back and let her know that Tina [name] will call her later today

## 2016-07-01 NOTE — Telephone Encounter (Signed)
Heather from [facility] emailed me back.   Hello Lafonda MossesDiana,  I just checked [employer] and her Medicaid Auth has been approved, we will call and schedule her today. Thank you :)  I did call her 06/18/2016 to let her know I have the referral and am submitting Authorization and left a voicemail.  She never did call back, I want to be sure we have the correct number for her.  [id number] is the number I have on file.  Thank you for your help ??   Thank you so much!  Dellis FilbertHeather Barnett, CSR [facility] [address] [place], [place] [address]

## 2016-07-01 NOTE — Telephone Encounter (Signed)
Pt called said has not heard back reg auto bipap therapy. Please call

## 2016-07-01 NOTE — Telephone Encounter (Signed)
Referral faxed 9/26. I will contact [facility] for status update.

## 2016-07-04 ENCOUNTER — Other Ambulatory Visit: Payer: Self-pay | Admitting: Orthopedic Surgery

## 2016-07-09 NOTE — Progress Notes (Signed)
Anesthesia Chart Review:  Pt is a same day work up.   Pt is a 60 year old female scheduled for C3-4, C4-5, C5-6 ACDF on 07/16/2016 with Estill BambergMark Dumonski, MD.   - PCP is Nanetta BattyMari-Jane Shaffer, FNP.   PMH includes:  HTN, palpitations, DM, hyperlipidemia, OSA, COPD, GERD. Current smoker. BMI 25. S/p L hip hemiarthroplasty 08/27/12.   Medications include: atenolol, glipizide, losartan, lovastatin, protonix  Labs will be obtained DOS. HgbA1c was 5.9 on 06/19/16.   EKG 05/19/16: NSR. Possible LAE. Septal infarct. Poor R wave progression. Appears stable when compared to EKG 08/28/12.   Nuclear stress test 06/30/16:  - Normal myocardial perfusion study - No evidence for significant ischemia or scar is noted. - Post stress: Global systolic function is normal. The EF was greater than 65%. - Minimal coronary calcifications are noted  Pt was originally scheduled for surgery 06/25/16 but it was postponed so pt could have stress test - see above. She had been evaluated by cardiologist Moss McWeeranun Bode, MD 12/13/15 (notes in care everywhere) for palpitations and chest pain. Dr. Erline LevineBode felt chest pain was atypical but recommended stress test as pt has multiple cardiac risk factors but pt declined stress test. At office visit with Eldred MangesPrabhat Kumar, MD with cardiology (notes in care everywhere) on 07/08/16, pt was cleared for surgery.   If labs acceptable DOS, I anticipate pt can proceed as scheduled.   Rica Mastngela Kabbe, FNP-BC [facility] Phone: [phone] 07/09/2016 2:28 PM

## 2016-07-15 ENCOUNTER — Encounter (HOSPITAL_COMMUNITY): Payer: Self-pay | Admitting: *Deleted

## 2016-07-15 NOTE — Progress Notes (Addendum)
Mrs Tina [name] states she is a pre - diabetic, she does not have a CBG.  Patient takes Glipizide in am, patient instructed to not take it in the am. Last A1C was 5.9 , drawn 06/19/16.

## 2016-07-16 ENCOUNTER — Ambulatory Visit (HOSPITAL_COMMUNITY): Payer: Medicaid Other

## 2016-07-16 ENCOUNTER — Inpatient Hospital Stay (HOSPITAL_COMMUNITY)
Admission: RE | Admit: 2016-07-16 | Discharge: 2016-07-17 | DRG: 473 | Disposition: A | Discharge status: Home / Self Care | Payer: Medicaid Other | Source: Ambulatory Visit | Attending: Orthopedic Surgery | Admitting: Orthopedic Surgery

## 2016-07-16 ENCOUNTER — Encounter (HOSPITAL_COMMUNITY): Payer: Self-pay | Admitting: Certified Registered Nurse Anesthetist

## 2016-07-16 ENCOUNTER — Encounter (HOSPITAL_COMMUNITY)
Admission: RE | Disposition: A | Discharge status: Home / Self Care | Payer: Self-pay | Source: Ambulatory Visit | Attending: Orthopedic Surgery

## 2016-07-16 ENCOUNTER — Ambulatory Visit (HOSPITAL_COMMUNITY): Payer: Medicaid Other | Admitting: Emergency Medicine

## 2016-07-16 DIAGNOSIS — E785 Hyperlipidemia, unspecified: Secondary | ICD-10-CM | POA: Diagnosis present

## 2016-07-16 DIAGNOSIS — M5000 Cervical disc disorder with myelopathy, unspecified cervical region: Secondary | ICD-10-CM | POA: Diagnosis present

## 2016-07-16 DIAGNOSIS — Z419 Encounter for procedure for purposes other than remedying health state, unspecified: Secondary | ICD-10-CM

## 2016-07-16 DIAGNOSIS — M810 Age-related osteoporosis without current pathological fracture: Secondary | ICD-10-CM | POA: Diagnosis present

## 2016-07-16 DIAGNOSIS — J449 Chronic obstructive pulmonary disease, unspecified: Secondary | ICD-10-CM | POA: Diagnosis present

## 2016-07-16 DIAGNOSIS — K219 Gastro-esophageal reflux disease without esophagitis: Secondary | ICD-10-CM | POA: Diagnosis present

## 2016-07-16 DIAGNOSIS — Z96642 Presence of left artificial hip joint: Secondary | ICD-10-CM | POA: Diagnosis present

## 2016-07-16 DIAGNOSIS — M797 Fibromyalgia: Secondary | ICD-10-CM | POA: Diagnosis present

## 2016-07-16 DIAGNOSIS — M50022 Cervical disc disorder at C5-C6 level with myelopathy: Principal | ICD-10-CM | POA: Diagnosis present

## 2016-07-16 DIAGNOSIS — E119 Type 2 diabetes mellitus without complications: Secondary | ICD-10-CM | POA: Diagnosis present

## 2016-07-16 DIAGNOSIS — G4730 Sleep apnea, unspecified: Secondary | ICD-10-CM | POA: Diagnosis present

## 2016-07-16 DIAGNOSIS — M419 Scoliosis, unspecified: Secondary | ICD-10-CM | POA: Diagnosis present

## 2016-07-16 DIAGNOSIS — F17210 Nicotine dependence, cigarettes, uncomplicated: Secondary | ICD-10-CM | POA: Diagnosis present

## 2016-07-16 DIAGNOSIS — I10 Essential (primary) hypertension: Secondary | ICD-10-CM | POA: Diagnosis present

## 2016-07-16 DIAGNOSIS — R531 Weakness: Secondary | ICD-10-CM | POA: Diagnosis present

## 2016-07-16 HISTORY — PX: ANTERIOR CERVICAL DECOMP/DISCECTOMY FUSION: SHX1161

## 2016-07-16 LAB — COMPREHENSIVE METABOLIC PANEL
ALK PHOS: 54 U/L (ref 38–126)
ALT: 18 U/L (ref 14–54)
ANION GAP: 11 (ref 5–15)
AST: 20 U/L (ref 15–41)
Albumin: 4.1 g/dL (ref 3.5–5.0)
BILIRUBIN TOTAL: 1.1 mg/dL (ref 0.3–1.2)
BUN: 13 mg/dL (ref 6–20)
CALCIUM: 9.4 mg/dL (ref 8.9–10.3)
CO2: 25 mmol/L (ref 22–32)
Chloride: 104 mmol/L (ref 101–111)
Creatinine, Ser: 1 mg/dL (ref 0.44–1.00)
Glucose, Bld: 137 mg/dL — ABNORMAL HIGH (ref 65–99)
Potassium: 3.5 mmol/L (ref 3.5–5.1)
SODIUM: 140 mmol/L (ref 135–145)
TOTAL PROTEIN: 6.4 g/dL — AB (ref 6.5–8.1)

## 2016-07-16 LAB — GLUCOSE, CAPILLARY
GLUCOSE-CAPILLARY: 160 mg/dL — AB (ref 65–99)
GLUCOSE-CAPILLARY: 174 mg/dL — AB (ref 65–99)
GLUCOSE-CAPILLARY: 207 mg/dL — AB (ref 65–99)
Glucose-Capillary: 278 mg/dL — ABNORMAL HIGH (ref 65–99)

## 2016-07-16 LAB — CBC WITH DIFFERENTIAL/PLATELET
BASOS PCT: 0 %
Basophils Absolute: 0 10*3/uL (ref 0.0–0.1)
EOS ABS: 0.1 10*3/uL (ref 0.0–0.7)
Eosinophils Relative: 1 %
HCT: 40.1 % (ref 36.0–46.0)
HEMOGLOBIN: 13.6 g/dL (ref 12.0–15.0)
LYMPHS ABS: 2.6 10*3/uL (ref 0.7–4.0)
Lymphocytes Relative: 42 %
MCH: 32.3 pg (ref 26.0–34.0)
MCHC: 33.9 g/dL (ref 30.0–36.0)
MCV: 95.2 fL (ref 78.0–100.0)
Monocytes Absolute: 0.6 10*3/uL (ref 0.1–1.0)
Monocytes Relative: 9 %
NEUTROS PCT: 48 %
Neutro Abs: 3 10*3/uL (ref 1.7–7.7)
Platelets: 203 10*3/uL (ref 150–400)
RBC: 4.21 MIL/uL (ref 3.87–5.11)
RDW: 13.3 % (ref 11.5–15.5)
WBC: 6.2 10*3/uL (ref 4.0–10.5)

## 2016-07-16 LAB — PROTIME-INR
INR: 0.97
PROTHROMBIN TIME: 12.9 s (ref 11.4–15.2)

## 2016-07-16 LAB — APTT: aPTT: 34 seconds (ref 24–36)

## 2016-07-16 SURGERY — ANTERIOR CERVICAL DECOMPRESSION/DISCECTOMY FUSION 3 LEVELS
Anesthesia: General | Site: Spine Cervical

## 2016-07-16 MED ORDER — HYDROMORPHONE HCL 2 MG/ML IJ SOLN
INTRAMUSCULAR | Status: AC
  Filled 2016-07-16: qty 1

## 2016-07-16 MED ORDER — ROCURONIUM BROMIDE 100 MG/10ML IV SOLN
INTRAVENOUS | Status: DC | PRN
  Administered 2016-07-16: 20 mg via INTRAVENOUS
  Administered 2016-07-16: 40 mg via INTRAVENOUS

## 2016-07-16 MED ORDER — OXYCODONE HCL 5 MG PO TABS
ORAL_TABLET | ORAL | Status: AC
  Filled 2016-07-16: qty 1

## 2016-07-16 MED ORDER — ONDANSETRON HCL 4 MG/2ML IJ SOLN
INTRAMUSCULAR | Status: AC
  Filled 2016-07-16: qty 2

## 2016-07-16 MED ORDER — THROMBIN 20000 UNITS EX KIT
PACK | CUTANEOUS | Status: DC | PRN
  Administered 2016-07-16: 20000 [IU] via TOPICAL

## 2016-07-16 MED ORDER — HYDROMORPHONE HCL 2 MG PO TABS
1.0000 mg | ORAL_TABLET | ORAL | Status: DC | PRN
  Administered 2016-07-16 – 2016-07-17 (×4): 2 mg via ORAL
  Filled 2016-07-16 (×4): qty 1

## 2016-07-16 MED ORDER — ONDANSETRON HCL 4 MG/2ML IJ SOLN
INTRAMUSCULAR | Status: DC | PRN
  Administered 2016-07-16: 4 mg via INTRAVENOUS

## 2016-07-16 MED ORDER — MENTHOL 3 MG MT LOZG: 1.0000 | LOZENGE | OROMUCOSAL | Status: DC | PRN

## 2016-07-16 MED ORDER — BUPIVACAINE HCL (PF) 0.25 % IJ SOLN
INTRAMUSCULAR | Status: AC
  Filled 2016-07-16: qty 30

## 2016-07-16 MED ORDER — THROMBIN 20000 UNITS EX SOLR
CUTANEOUS | Status: AC
  Filled 2016-07-16: qty 20000

## 2016-07-16 MED ORDER — LABETALOL HCL 5 MG/ML IV SOLN
INTRAVENOUS | Status: DC | PRN
  Administered 2016-07-16 (×3): 5 mg via INTRAVENOUS

## 2016-07-16 MED ORDER — LIDOCAINE 2% (20 MG/ML) 5 ML SYRINGE
INTRAMUSCULAR | Status: AC
  Filled 2016-07-16: qty 5

## 2016-07-16 MED ORDER — INSULIN ASPART 100 UNIT/ML ~~LOC~~ SOLN
0.0000 [IU] | Freq: Three times a day (TID) | SUBCUTANEOUS | Status: DC
Start: 2016-07-17 — End: 2016-07-17

## 2016-07-16 MED ORDER — DIAZEPAM 5 MG PO TABS
ORAL_TABLET | ORAL | Status: AC
  Filled 2016-07-16: qty 1

## 2016-07-16 MED ORDER — FENTANYL CITRATE (PF) 100 MCG/2ML IJ SOLN
INTRAMUSCULAR | Status: AC
  Filled 2016-07-16: qty 4

## 2016-07-16 MED ORDER — HYDROCHLOROTHIAZIDE 25 MG PO TABS
25.0000 mg | ORAL_TABLET | Freq: Every day | ORAL | Status: DC
  Administered 2016-07-16 – 2016-07-17 (×2): 25 mg via ORAL
  Filled 2016-07-16 (×2): qty 1

## 2016-07-16 MED ORDER — ALBUTEROL SULFATE (2.5 MG/3ML) 0.083% IN NEBU
2.5000 mg | INHALATION_SOLUTION | Freq: Four times a day (QID) | RESPIRATORY_TRACT | Status: DC | PRN
  Administered 2016-07-16: 2.5 mg via RESPIRATORY_TRACT
  Filled 2016-07-16: qty 3

## 2016-07-16 MED ORDER — ATENOLOL 100 MG PO TABS
100.0000 mg | ORAL_TABLET | Freq: Every day | ORAL | Status: DC
  Administered 2016-07-16: 100 mg via ORAL
  Filled 2016-07-16: qty 1

## 2016-07-16 MED ORDER — DIAZEPAM 5 MG PO TABS
5.0000 mg | ORAL_TABLET | Freq: Four times a day (QID) | ORAL | Status: DC | PRN
  Administered 2016-07-16: 5 mg via ORAL
  Filled 2016-07-16: qty 1

## 2016-07-16 MED ORDER — SODIUM CHLORIDE 0.9% FLUSH: 3.0000 mL | Freq: Two times a day (BID) | INTRAVENOUS | Status: DC

## 2016-07-16 MED ORDER — ONDANSETRON HCL 4 MG/2ML IJ SOLN: 4.0000 mg | Freq: Four times a day (QID) | INTRAMUSCULAR | Status: DC | PRN

## 2016-07-16 MED ORDER — MIDAZOLAM HCL 2 MG/2ML IJ SOLN
INTRAMUSCULAR | Status: DC | PRN
  Administered 2016-07-16 (×2): 1 mg via INTRAVENOUS

## 2016-07-16 MED ORDER — HYDROMORPHONE HCL 1 MG/ML IJ SOLN
0.2500 mg | INTRAMUSCULAR | Status: DC | PRN
  Administered 2016-07-16: 0.5 mg via INTRAVENOUS

## 2016-07-16 MED ORDER — LABETALOL HCL 5 MG/ML IV SOLN
INTRAVENOUS | Status: AC
  Filled 2016-07-16: qty 4

## 2016-07-16 MED ORDER — SUCCINYLCHOLINE CHLORIDE 200 MG/10ML IV SOSY
PREFILLED_SYRINGE | INTRAVENOUS | Status: AC
  Filled 2016-07-16: qty 10

## 2016-07-16 MED ORDER — LACTATED RINGERS IV SOLN
INTRAVENOUS | Status: DC | PRN
  Administered 2016-07-16 (×2): via INTRAVENOUS

## 2016-07-16 MED ORDER — SUGAMMADEX SODIUM 200 MG/2ML IV SOLN
INTRAVENOUS | Status: AC
  Filled 2016-07-16: qty 2

## 2016-07-16 MED ORDER — ALBUTEROL SULFATE HFA 108 (90 BASE) MCG/ACT IN AERS
INHALATION_SPRAY | RESPIRATORY_TRACT | Status: DC | PRN
  Administered 2016-07-16 (×2): 2 via RESPIRATORY_TRACT

## 2016-07-16 MED ORDER — LOSARTAN POTASSIUM 50 MG PO TABS
100.0000 mg | ORAL_TABLET | ORAL | Status: DC
  Administered 2016-07-17: 100 mg via ORAL
  Filled 2016-07-16: qty 2

## 2016-07-16 MED ORDER — PROPOFOL 10 MG/ML IV BOLUS
INTRAVENOUS | Status: AC
  Filled 2016-07-16: qty 20

## 2016-07-16 MED ORDER — ARTIFICIAL TEARS OP OINT
TOPICAL_OINTMENT | OPHTHALMIC | Status: DC | PRN
  Administered 2016-07-16: 1 via OPHTHALMIC

## 2016-07-16 MED ORDER — FLEET ENEMA 7-19 GM/118ML RE ENEM: 1.0000 | ENEMA | Freq: Once | RECTAL | Status: DC | PRN

## 2016-07-16 MED ORDER — METOPROLOL TARTRATE 5 MG/5ML IV SOLN
INTRAVENOUS | Status: AC
  Filled 2016-07-16: qty 5

## 2016-07-16 MED ORDER — CEFAZOLIN IN D5W 1 GM/50ML IV SOLN
1.0000 g | Freq: Three times a day (TID) | INTRAVENOUS | Status: AC
Start: 2016-07-16 — End: 2016-07-16
  Administered 2016-07-16 (×2): 1 g via INTRAVENOUS
  Filled 2016-07-16 (×2): qty 50

## 2016-07-16 MED ORDER — PANTOPRAZOLE SODIUM 20 MG PO TBEC
20.0000 mg | DELAYED_RELEASE_TABLET | ORAL | Status: DC
  Administered 2016-07-17: 20 mg via ORAL
  Filled 2016-07-16: qty 1

## 2016-07-16 MED ORDER — PRAVASTATIN SODIUM 40 MG PO TABS
20.0000 mg | ORAL_TABLET | Freq: Every day | ORAL | Status: DC
  Administered 2016-07-16: 20 mg via ORAL
  Filled 2016-07-16: qty 1

## 2016-07-16 MED ORDER — FENTANYL CITRATE (PF) 100 MCG/2ML IJ SOLN
INTRAMUSCULAR | Status: DC | PRN
  Administered 2016-07-16 (×2): 100 ug via INTRAVENOUS

## 2016-07-16 MED ORDER — PROPOFOL 1000 MG/100ML IV EMUL
INTRAVENOUS | Status: AC
  Filled 2016-07-16: qty 300

## 2016-07-16 MED ORDER — MIDAZOLAM HCL 2 MG/2ML IJ SOLN
INTRAMUSCULAR | Status: AC
  Filled 2016-07-16: qty 2

## 2016-07-16 MED ORDER — ARTIFICIAL TEARS OP OINT
TOPICAL_OINTMENT | OPHTHALMIC | Status: AC
  Filled 2016-07-16: qty 3.5

## 2016-07-16 MED ORDER — HYDROMORPHONE HCL 1 MG/ML IJ SOLN
INTRAMUSCULAR | Status: DC | PRN
  Administered 2016-07-16 (×4): .25 mg via INTRAVENOUS

## 2016-07-16 MED ORDER — ALBUTEROL SULFATE HFA 108 (90 BASE) MCG/ACT IN AERS
INHALATION_SPRAY | RESPIRATORY_TRACT | Status: AC
  Filled 2016-07-16: qty 6.7

## 2016-07-16 MED ORDER — GLYCOPYRROLATE 0.2 MG/ML IJ SOLN
INTRAMUSCULAR | Status: DC | PRN
  Administered 2016-07-16: 0.2 mg via INTRAVENOUS

## 2016-07-16 MED ORDER — HYDROMORPHONE HCL 1 MG/ML IJ SOLN
INTRAMUSCULAR | Status: AC
  Filled 2016-07-16: qty 1

## 2016-07-16 MED ORDER — EPINEPHRINE PF 1 MG/ML IJ SOLN
INTRAMUSCULAR | Status: AC
  Filled 2016-07-16: qty 1

## 2016-07-16 MED ORDER — POVIDONE-IODINE 7.5 % EX SOLN
Freq: Once | CUTANEOUS | Status: DC
  Filled 2016-07-16: qty 118

## 2016-07-16 MED ORDER — DOCUSATE SODIUM 100 MG PO CAPS
100.0000 mg | ORAL_CAPSULE | Freq: Two times a day (BID) | ORAL | Status: DC
  Administered 2016-07-16 – 2016-07-17 (×2): 100 mg via ORAL
  Filled 2016-07-16 (×2): qty 1

## 2016-07-16 MED ORDER — BISACODYL 5 MG PO TBEC: 5.0000 mg | DELAYED_RELEASE_TABLET | Freq: Every day | ORAL | Status: DC | PRN

## 2016-07-16 MED ORDER — SENNOSIDES-DOCUSATE SODIUM 8.6-50 MG PO TABS
1.0000 | ORAL_TABLET | Freq: Every evening | ORAL | Status: DC | PRN
Start: 2016-07-16 — End: 2016-07-17

## 2016-07-16 MED ORDER — INSULIN ASPART 100 UNIT/ML ~~LOC~~ SOLN
0.0000 [IU] | Freq: Every day | SUBCUTANEOUS | Status: DC
  Administered 2016-07-16: 3 [IU] via SUBCUTANEOUS

## 2016-07-16 MED ORDER — GLYCOPYRROLATE 0.2 MG/ML IV SOSY
PREFILLED_SYRINGE | INTRAVENOUS | Status: AC
  Filled 2016-07-16: qty 3

## 2016-07-16 MED ORDER — METOPROLOL TARTARATE 1 MG/ML SYRINGE (5ML)
Status: DC | PRN
  Administered 2016-07-16: 1 mg via INTRAVENOUS
  Administered 2016-07-16 (×2): 2 mg via INTRAVENOUS

## 2016-07-16 MED ORDER — HYDRALAZINE HCL 10 MG PO TABS
10.0000 mg | ORAL_TABLET | Freq: Every day | ORAL | Status: DC
  Administered 2016-07-16: 10 mg via ORAL
  Filled 2016-07-16: qty 1

## 2016-07-16 MED ORDER — HYDROMORPHONE HCL 1 MG/ML IJ SOLN: 0.5000 mg | INTRAMUSCULAR | Status: DC | PRN

## 2016-07-16 MED ORDER — PHENOL 1.4 % MT LIQD
1.0000 | OROMUCOSAL | Status: DC | PRN
Start: 2016-07-16 — End: 2016-07-17
  Administered 2016-07-16: 1 via OROMUCOSAL
  Filled 2016-07-16: qty 177

## 2016-07-16 MED ORDER — SODIUM CHLORIDE 0.9 % IV SOLN: 250.0000 mL | INTRAVENOUS | Status: DC

## 2016-07-16 MED ORDER — ACETAMINOPHEN 650 MG RE SUPP: 650.0000 mg | RECTAL | Status: DC | PRN

## 2016-07-16 MED ORDER — ZOLPIDEM TARTRATE 5 MG PO TABS: 5.0000 mg | ORAL_TABLET | Freq: Every evening | ORAL | Status: DC | PRN

## 2016-07-16 MED ORDER — DEXTROSE 5 % IV SOLN
INTRAVENOUS | Status: DC | PRN
  Administered 2016-07-16: 50 ug/min via INTRAVENOUS

## 2016-07-16 MED ORDER — 0.9 % SODIUM CHLORIDE (POUR BTL) OPTIME
TOPICAL | Status: DC | PRN
  Administered 2016-07-16: 1000 mL

## 2016-07-16 MED ORDER — OXYCODONE HCL 5 MG PO TABS
5.0000 mg | ORAL_TABLET | Freq: Once | ORAL | Status: AC | PRN
  Administered 2016-07-16: 5 mg via ORAL

## 2016-07-16 MED ORDER — SODIUM CHLORIDE 0.9% FLUSH: 3.0000 mL | INTRAVENOUS | Status: DC | PRN

## 2016-07-16 MED ORDER — OXYCODONE HCL 5 MG/5ML PO SOLN: 5.0000 mg | Freq: Once | ORAL | Status: AC | PRN

## 2016-07-16 MED ORDER — ALUM & MAG HYDROXIDE-SIMETH 200-200-20 MG/5ML PO SUSP: 30.0000 mL | Freq: Four times a day (QID) | ORAL | Status: DC | PRN

## 2016-07-16 MED ORDER — ONDANSETRON HCL 4 MG/2ML IJ SOLN: 4.0000 mg | INTRAMUSCULAR | Status: DC | PRN

## 2016-07-16 MED ORDER — LIDOCAINE HCL (CARDIAC) 20 MG/ML IV SOLN
INTRAVENOUS | Status: DC | PRN
  Administered 2016-07-16: 60 mg via INTRATRACHEAL

## 2016-07-16 MED ORDER — PROPOFOL 10 MG/ML IV BOLUS
INTRAVENOUS | Status: DC | PRN
  Administered 2016-07-16: 50 mg via INTRAVENOUS
  Administered 2016-07-16: 150 mg via INTRAVENOUS

## 2016-07-16 MED ORDER — GLIPIZIDE ER 10 MG PO TB24
10.0000 mg | ORAL_TABLET | Freq: Every day | ORAL | Status: DC
Start: 2016-07-17 — End: 2016-07-17
  Administered 2016-07-17: 10 mg via ORAL
  Filled 2016-07-16: qty 1

## 2016-07-16 MED ORDER — SUGAMMADEX SODIUM 200 MG/2ML IV SOLN
INTRAVENOUS | Status: DC | PRN
  Administered 2016-07-16: 200 mg via INTRAVENOUS

## 2016-07-16 MED ORDER — CEFAZOLIN SODIUM-DEXTROSE 2-4 GM/100ML-% IV SOLN
2.0000 g | INTRAVENOUS | Status: AC
  Administered 2016-07-16: 2 g via INTRAVENOUS
  Filled 2016-07-16: qty 100

## 2016-07-16 MED ORDER — ROCURONIUM BROMIDE 10 MG/ML (PF) SYRINGE
PREFILLED_SYRINGE | INTRAVENOUS | Status: AC
  Filled 2016-07-16: qty 10

## 2016-07-16 MED ORDER — ACETAMINOPHEN 325 MG PO TABS: 650.0000 mg | ORAL_TABLET | ORAL | Status: DC | PRN

## 2016-07-16 MED ORDER — PROPOFOL 500 MG/50ML IV EMUL
INTRAVENOUS | Status: DC | PRN
  Administered 2016-07-16: 25 ug/kg/min via INTRAVENOUS

## 2016-07-16 MED ORDER — BUPIVACAINE HCL (PF) 0.5 % IJ SOLN
INTRAMUSCULAR | Status: AC
  Filled 2016-07-16: qty 30

## 2016-07-16 SURGICAL SUPPLY — 77 items
BENZOIN TINCTURE PRP APPL 2/3 (GAUZE/BANDAGES/DRESSINGS) ×3 IMPLANT
BIT DRILL NEURO 2X3.1 SFT TUCH (MISCELLANEOUS) ×1 IMPLANT
BIT DRILL SRG 14X2.2XFLT CHK (BIT) ×1 IMPLANT
BIT DRL SRG 14X2.2XFLT CHK (BIT) ×1
BLADE SURG 15 STRL LF DISP TIS (BLADE) ×1 IMPLANT
BLADE SURG 15 STRL SS (BLADE) ×2
BLADE SURG ROTATE 9660 (MISCELLANEOUS) ×3 IMPLANT
CANISTER SUCT 3000ML (MISCELLANEOUS) ×3 IMPLANT
CARTRIDGE OIL MAESTRO DRILL (MISCELLANEOUS) ×1 IMPLANT
CLOSURE STERI-STRIP 1/2X4 (GAUZE/BANDAGES/DRESSINGS) ×1
CLOSURE WOUND 1/2 X4 (GAUZE/BANDAGES/DRESSINGS) ×1
CLSR STERI-STRIP ANTIMIC 1/2X4 (GAUZE/BANDAGES/DRESSINGS) ×2 IMPLANT
COVER SURGICAL LIGHT HANDLE (MISCELLANEOUS) ×3 IMPLANT
CRADLE DONUT ADULT HEAD (MISCELLANEOUS) ×3 IMPLANT
DIFFUSER DRILL AIR PNEUMATIC (MISCELLANEOUS) ×3 IMPLANT
DRAIN JACKSON RD 7FR 3/32 (WOUND CARE) IMPLANT
DRAPE C-ARM 42X72 X-RAY (DRAPES) ×3 IMPLANT
DRAPE POUCH INSTRU U-SHP 10X18 (DRAPES) ×3 IMPLANT
DRAPE SURG 17X23 STRL (DRAPES) ×9 IMPLANT
DRILL BIT SKYLINE 14MM (BIT) ×2
DRILL NEURO 2X3.1 SOFT TOUCH (MISCELLANEOUS) ×3
DURAPREP 6ML APPLICATOR 50/CS (WOUND CARE) ×3 IMPLANT
ELECT COATED BLADE 2.86 ST (ELECTRODE) ×3 IMPLANT
ELECT REM PT RETURN 9FT ADLT (ELECTROSURGICAL) ×3
ELECTRODE REM PT RTRN 9FT ADLT (ELECTROSURGICAL) ×1 IMPLANT
EVACUATOR SILICONE 100CC (DRAIN) IMPLANT
GAUZE SPONGE 4X4 12PLY STRL (GAUZE/BANDAGES/DRESSINGS) ×3 IMPLANT
GAUZE SPONGE 4X4 16PLY XRAY LF (GAUZE/BANDAGES/DRESSINGS) ×3 IMPLANT
GLOVE BIO SURGEON STRL SZ7 (GLOVE) ×3 IMPLANT
GLOVE BIO SURGEON STRL SZ8 (GLOVE) ×3 IMPLANT
GLOVE BIOGEL PI IND STRL 7.0 (GLOVE) ×1 IMPLANT
GLOVE BIOGEL PI IND STRL 8 (GLOVE) ×1 IMPLANT
GLOVE BIOGEL PI INDICATOR 7.0 (GLOVE) ×2
GLOVE BIOGEL PI INDICATOR 8 (GLOVE) ×2
GOWN STRL REUS W/ TWL LRG LVL3 (GOWN DISPOSABLE) ×1 IMPLANT
GOWN STRL REUS W/ TWL XL LVL3 (GOWN DISPOSABLE) ×1 IMPLANT
GOWN STRL REUS W/TWL LRG LVL3 (GOWN DISPOSABLE) ×2
GOWN STRL REUS W/TWL XL LVL3 (GOWN DISPOSABLE) ×2
INTERLOCK LRDTC CRVCL VBR 6MM (Bone Implant) ×2 IMPLANT
INTERLOCK LRDTC CRVCL VBR 8MM (Peek) ×1 IMPLANT
IV CATH 14GX2 1/4 (CATHETERS) ×3 IMPLANT
KIT BASIN OR (CUSTOM PROCEDURE TRAY) ×3 IMPLANT
KIT ROOM TURNOVER OR (KITS) ×3 IMPLANT
LORDOTIC CERVICAL VBR 6MM SM (Bone Implant) ×6 IMPLANT
LORDOTIC CERVICAL VBR 8MM SM (Peek) ×3 IMPLANT
MATRIX HEMOSTAT SURGIFLO (HEMOSTASIS) ×3 IMPLANT
NEEDLE 27GAX1X1/2 (NEEDLE) ×3 IMPLANT
NEEDLE SPNL 20GX3.5 QUINCKE YW (NEEDLE) ×3 IMPLANT
NS IRRIG 1000ML POUR BTL (IV SOLUTION) ×3 IMPLANT
OIL CARTRIDGE MAESTRO DRILL (MISCELLANEOUS) ×3
PACK ORTHO CERVICAL (CUSTOM PROCEDURE TRAY) ×3 IMPLANT
PAD ARMBOARD 7.5X6 YLW CONV (MISCELLANEOUS) ×3 IMPLANT
PATTIES SURGICAL .5 X.5 (GAUZE/BANDAGES/DRESSINGS) IMPLANT
PATTIES SURGICAL .5 X1 (DISPOSABLE) IMPLANT
PIN DISTRACTION 14 (PIN) ×6 IMPLANT
PLATE SKYLINE 3 LVL 48MM (Plate) ×3 IMPLANT
PUTTY BONE DBX 2.5 MIS (Bone Implant) ×3 IMPLANT
PUTTY BONE DBX 5CC MIX (Putty) ×3 IMPLANT
SCREW SKYLINE VAR OS 14MM (Screw) ×24 IMPLANT
SPONGE INTESTINAL PEANUT (DISPOSABLE) ×3 IMPLANT
SPONGE SURGIFOAM ABS GEL 100 (HEMOSTASIS) IMPLANT
STRIP CLOSURE SKIN 1/2X4 (GAUZE/BANDAGES/DRESSINGS) ×2 IMPLANT
SURGIFLO W/THROMBIN 8M KIT (HEMOSTASIS) IMPLANT
SUT MNCRL AB 4-0 PS2 18 (SUTURE) ×3 IMPLANT
SUT SILK 4 0 (SUTURE)
SUT SILK 4-0 18XBRD TIE 12 (SUTURE) IMPLANT
SUT VIC AB 2-0 CT2 18 VCP726D (SUTURE) ×3 IMPLANT
SYR BULB IRRIGATION 50ML (SYRINGE) ×3 IMPLANT
SYR CONTROL 10ML LL (SYRINGE) ×3 IMPLANT
TAPE CLOTH 4X10 WHT NS (GAUZE/BANDAGES/DRESSINGS) ×3 IMPLANT
TAPE CLOTH SURG 4X10 WHT LF (GAUZE/BANDAGES/DRESSINGS) ×3 IMPLANT
TAPE UMBILICAL COTTON 1/8X30 (MISCELLANEOUS) ×3 IMPLANT
TOWEL OR 17X24 6PK STRL BLUE (TOWEL DISPOSABLE) ×3 IMPLANT
TOWEL OR 17X26 10 PK STRL BLUE (TOWEL DISPOSABLE) ×3 IMPLANT
TRAY FOLEY CATH 16FRSI W/METER (SET/KITS/TRAYS/PACK) ×3 IMPLANT
WATER STERILE IRR 1000ML POUR (IV SOLUTION) ×3 IMPLANT
YANKAUER SUCT BULB TIP NO VENT (SUCTIONS) ×3 IMPLANT

## 2016-07-16 NOTE — Transfer of Care (Signed)
Immediate Anesthesia Transfer of Care Note  Patient: Tina [name]  Procedure(s) Performed: Procedure(s) with comments: ANTERIOR CERVICAL DECOMPRESSION FUSION CERVICAL 3-4, CERVICAL 4-5, CERVICAL 5-6 WITH INSTRUMENATION AND ALLOGRAFT (N/A) - ANTERIOR CERVICAL DECOMPRESSION FUSION CERVICAL 3-4, CERVICAL 4-5, CERVICAL 5-6 WITH INSTRUMENATION AND ALLOGRAFT  Patient Location: PACU  Anesthesia Type:General  Level of Consciousness: awake, alert , oriented and patient cooperative  Airway & Oxygen Therapy: Patient Spontanous Breathing and Patient connected to nasal cannula oxygen  Post-op Assessment: Report given to RN, Post -op Vital signs reviewed and stable, Patient moving all extremities X 4 and Patient able to stick tongue midline  Post vital signs: Reviewed and stable  Last Vitals:  Vitals:   07/16/16 0714  BP: (!) 153/74  Pulse: 71  Resp: 20  Temp: 36.8 C    Last Pain:  Vitals:   07/16/16 0714  TempSrc: Oral  PainSc: 6       Patients Stated Pain Goal: 2 (07/16/16 0714)  Complications: No apparent anesthesia complications

## 2016-07-16 NOTE — H&P (Signed)
Of note, patient new MRI does reveal significant, significant progression her previously noted spinal cord compression. This does increase the patient's risk of complications such as spinal cord injury, given the very severe compression now noted. This was discussed with patient.

## 2016-07-16 NOTE — Anesthesia Preprocedure Evaluation (Signed)
Anesthesia Evaluation  Patient identified by MRN, date of birth, ID band Patient awake    Reviewed: Allergy & Precautions, H&P , NPO status , Patient's Chart, lab work & pertinent test results  Airway Mallampati: II   Neck ROM: full    Dental   Pulmonary sleep apnea , COPD, Current Smoker,    breath sounds clear to auscultation       Cardiovascular hypertension,  Rhythm:regular Rate:Normal     Neuro/Psych    GI/Hepatic GERD  ,  Endo/Other  diabetes, Type 2  Renal/GU      Musculoskeletal  (+) Arthritis , Fibromyalgia -  Abdominal   Peds  Hematology   Anesthesia Other Findings   Reproductive/Obstetrics                             Anesthesia Physical Anesthesia Plan  ASA: II  Anesthesia Plan: General   Post-op Pain Management:    Induction: Intravenous  Airway Management Planned: Oral ETT  Additional Equipment:   Intra-op Plan:   Post-operative Plan: Extubation in OR  Informed Consent: I have reviewed the patients History and Physical, chart, labs and discussed the procedure including the risks, benefits and alternatives for the proposed anesthesia with the patient or authorized representative who has indicated his/her understanding and acceptance.     Plan Discussed with: CRNA, Anesthesiologist and Surgeon  Anesthesia Plan Comments:         Anesthesia Quick Evaluation

## 2016-07-16 NOTE — Anesthesia Postprocedure Evaluation (Signed)
Anesthesia Post Note  Patient: Tina [name]  Procedure(s) Performed: Procedure(s) (LRB): ANTERIOR CERVICAL DECOMPRESSION FUSION CERVICAL 3-4, CERVICAL 4-5, CERVICAL 5-6 WITH INSTRUMENATION AND ALLOGRAFT (N/A)  Patient location during evaluation: PACU Anesthesia Type: General Level of consciousness: awake and alert and patient cooperative Pain management: pain level controlled Vital Signs Assessment: post-procedure vital signs reviewed and stable Respiratory status: spontaneous breathing and respiratory function stable Cardiovascular status: stable Anesthetic complications: no    Last Vitals:  Vitals:   07/16/16 1330 07/16/16 1411  BP:  (!) 161/68  Pulse:  95  Resp: 20 20  Temp:  36.9 C    Last Pain:  Vitals:   07/16/16 1411  TempSrc: Oral  PainSc:     LLE Motor Response: Purposeful movement (07/16/16 1415)   RLE Motor Response: Purposeful movement (07/16/16 1415)        Doratha Mcswain S

## 2016-07-16 NOTE — Anesthesia Procedure Notes (Signed)
Procedure Name: Intubation Date/Time: 07/16/2016 8:52 AM Performed by: Chaney MallingHODIERNE, ADAM Pre-anesthesia Checklist: Patient identified, Emergency Drugs available, Suction available and Patient being monitored Patient Re-evaluated:Patient Re-evaluated prior to inductionOxygen Delivery Method: Circle system utilized Preoxygenation: Pre-oxygenation with 100% oxygen Intubation Type: IV induction Ventilation: Mask ventilation without difficulty Laryngoscope Size: Glidescope (T4) Grade View: Grade II Tube type: Oral Tube size: 7.0 mm Number of attempts: 2 (DL by CRNA, then MDA) Airway Equipment and Method: Stylet and Video-laryngoscopy Placement Confirmation: ETT inserted through vocal cords under direct vision,  positive ETCO2 and breath sounds checked- equal and bilateral Secured at: 22 cm Tube secured with: Tape Dental Injury: Teeth and Oropharynx as per pre-operative assessment  Difficulty Due To: Difficulty was anticipated

## 2016-07-16 NOTE — H&P (Signed)
PREOPERATIVE H&P  Chief Complaint: hand numbness and weakness  HPI: Tina SchirmerJacqueline L [name] is a 60 y.o. 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 Arthritis    "joints; fingers; shoulders; knees; back" (08/27/2012)  . Chronic back pain    epidural injections  . Chronic neck pain    myelopathy  . COPD (chronic obstructive pulmonary disease) (HCC)   . Fibromyalgia    "dx'd long time ago; before they really knew what it was" (112/02/2012)  . GERD (gastroesophageal reflux disease)    takes Protonix daily  . Heart murmur   . History of bronchitis several yrs ago  . History of colon polyps   . Hyperlipidemia    takes Lovastatin daily  . Hypertension    takes Losartan daily  . Joint swelling   . Osteoporosis   . Palpitations    takes Atenolol daily  . Pneumonia 2013   hx of  . Scoliosis   . Sleep apnea    "have a mask but I don't wear it" (08/27/2012)  . Type II diabetes mellitus (HCC)    takes Glipide daily  . Weakness    numbness in hands and feet   Past Surgical History:  Procedure Laterality Date  . COLONOSCOPY    . ESOPHAGOGASTRODUODENOSCOPY    . fatty tumor removed from right leg     as a teenager  . HIP ARTHROPLASTY  08/27/2012   Procedure: ARTHROPLASTY BIPOLAR HIP;  Surgeon: Velna OchsPeter G Dalldorf, MD;  Location: [facility] OR;  Service: Orthopedics;  Laterality: Left;   Social History   Social History  . Marital status: Divorced    Spouse name: N/A  . Number of children: N/A  . Years of education: N/A   Social History Main Topics  . Smoking status: Current Every Day Smoker    Packs/day: 0.50    Years: 30.00    Types: Cigarettes  . Smokeless  tobacco: Never Used  . Alcohol use No  . Drug use: No  . Sexual activity: No   Other Topics Concern  . None   Social History Narrative  . None   Family History  Problem Relation Age of Onset  . Breast cancer    . Throat cancer    . Lung cancer     Allergies  Allergen Reactions  . Hydrocodone-Acetaminophen Palpitations and Other (See Comments)    Other reaction(s): Dizziness  chest pain  . Tramadol Other (See Comments)    "It makes my chest feel funny; I don't like to take that"  . Lisinopril Other (See Comments)    cough   Prior to Admission medications   Medication Sig Start Date End Date Taking? Authorizing Provider  acetaminophen (TYLENOL) 500 MG tablet Take 500 mg by mouth every 6 (six) hours as needed for moderate pain or headache.   Yes Historical Provider, MD  albuterol (PROVENTIL HFA;VENTOLIN HFA) 108 (90 Base) MCG/ACT inhaler Inhale into the lungs every 6 (six) hours as needed for wheezing or shortness of breath.   Yes Historical Provider, MD  atenolol (TENORMIN) 50 MG tablet Take 100 mg by mouth at bedtime.  06/20/16  Yes Historical Provider, MD  glipiZIDE (  GLUCOTROL XL) 10 MG 24 hr tablet Take 10 mg by mouth daily.   Yes Historical Provider, MD  hydrALAZINE (APRESOLINE) 10 MG tablet Take 10 mg by mouth at bedtime.  07/01/16  Yes Historical Provider, MD  hydrochlorothiazide (HYDRODIURIL) 25 MG tablet Take 25 mg by mouth daily.   Yes Historical Provider, MD  losartan (COZAAR) 100 MG tablet Take 100 mg by mouth every morning.    Yes Historical Provider, MD  lovastatin (MEVACOR) 20 MG tablet Take 20 mg by mouth daily at 6 PM.  05/10/15  Yes Historical Provider, MD  pantoprazole (PROTONIX) 20 MG tablet Take 20 mg by mouth every morning.  05/10/15  Yes Historical Provider, MD     All other systems have been reviewed and were otherwise negative with the exception of those mentioned in the HPI and as above.  Physical Exam: Vitals:   07/16/16 0714  BP: (!) 153/74  Pulse:  71  Resp: 20  Temp: 98.3 F (36.8 C)    General: Alert, no acute distress Cardiovascular: No pedal edema Respiratory: No cyanosis, no use of accessory musculature Skin: No lesions in the area of chief complaint Neurologic: Sensation intact distally Psychiatric: Patient is competent for consent with normal mood and affect Lymphatic: No axillary or cervical lymphadenopathy  MUSCULOSKELETAL: + hoffman's sign bilaterally  Assessment/Plan: Myelopathy Plan for Procedure(s): ANTERIOR CERVICAL DECOMPRESSION FUSION CERVICAL 3-4, CERVICAL 4-5, CERVICAL 5-6 WITH INSTRUMENATION AND ALLOGRAFT   Emilee Hero, MD 07/16/2016 7:56 AM

## 2016-07-17 ENCOUNTER — Encounter (HOSPITAL_COMMUNITY): Payer: Self-pay | Admitting: *Deleted

## 2016-07-17 LAB — GLUCOSE, CAPILLARY: Glucose-Capillary: 135 mg/dL — ABNORMAL HIGH (ref 65–99)

## 2016-07-17 NOTE — Op Note (Signed)
[name], [name] NO.:  [id number]  MEDICAL RECORD NO.:  [id number]  LOCATION:  3C02C                        FACILITY:  [facility]  PHYSICIAN:  Estill Bamberg, MD      DATE OF BIRTH:  1955-09-27  DATE OF PROCEDURE:  07/16/2016                              OPERATIVE REPORT   PREOPERATIVE DIAGNOSIS:  Progressive cervical myelopathy.  POSTOPERATIVE DIAGNOSIS:  Progressive cervical myelopathy.  PROCEDURES: 1. Anterior cervical decompression and fusion; C3-4, C4-5, C5-6. 2. Placement of anterior instrumentation, C3 to C6. 3. Insertion of interbody device x3 (Titan intervertebral spacers). 4. Use of morselized allograft. 5. Intraoperative use of fluoroscopy.  SURGEON:  Estill Bamberg, MD  ASSISTANT:  Jason Coop, PA-C.  ANESTHESIA:  General endotracheal anesthesia.  COMPLICATIONS:  None.  DISPOSITION:  Stable.  ESTIMATED BLOOD LOSS:  Minimal.  INDICATIONS FOR SURGERY:  Briefly, [name] is a pleasant 60 year old female, whom I did initially evaluate on January 01, 2015, with symptoms very much consistent with cervical myelopathy.  I did obtain an MRI which was notable for spinal cord compression.  I did discuss with her, at that point, the natural history of cervical myelopathy, and I did recommend surgical intervention, however, the patient did wish to hold off on surgery.  The patient then re-presented on June 06, 2016, with significant progression of her symptoms.  Her arms and legs were increasingly weak, as was her dexterity.  Given the patient's ongoing symptoms and findings on MRI, we did discuss proceeding with the procedure reflected above.  Of particular note, an updated MRI was performed and notable for substantial and significant progression of her spinal cord compression.  Particularly at the C4-5 level, there was a very significant increase in the size of her herniation, substantially and significantly compressing the spinal cord.   Myelomalacia was noted. I did discuss this finding with the patient preoperatively, and I did express to her the increased risk associated with surgery.  I did also express to her that the goal of surgery was not to necessarily reverse her current constellation of symptoms, but to prevent additional deterioration.  The patient did elect to proceed.  OPERATIVE DETAILS:  On July 16, 2016, the patient was brought to surgery and general endotracheal anesthesia was administered.  The patient was placed supine on the hospital bed and her arms were secured to her sides.  I did ensure that the Anesthesia team did not manipulate her neck during the intubation.  Her neck was then prepped and draped, and a time-out procedure was performed.  A left-sided incision was made. The platysma was incised and a Smith-Robinson approach was utilized and the anterior spine was noted.  The vertebral bodies to be fused from C3 to C6 were subperiosteally exposed.  Starting at the C5-6 level, I did place a self-retaining retractor and I did place Caspar pins into the C5 and C6 vertebral bodies and gentle distraction was applied.  A thorough and complete diskectomy was performed, decompressing the spinal canal and the right and left neural foramina.  The appropriate size interbody spacer was packed with DBX mix and tamped into position in the usual fashion.  The lower Caspar pin was removed, the bone  wax was placed in its place.  A new Caspar pin was placed into the C4 vertebral body and again, gentle distraction was applied across the C4-5 intervertebral space.  At this particular level, once again, a diskectomy was performed, and when I did approach the posterior longitudinal ligament, it was clear that there was a very large disc herniation at the central aspect of the spinal canal, significantly compressing the spinal cord. I was able to meticulously and delicately remove the herniated fragments at the level  of the intervertebral disc, which did slightly extend both above and below the intervertebral space.  This portion of the procedure was very meticulous and time consuming.  However, I was able to thoroughly decompress the spinal canal and the right and left neural foramina.  The endplates were prepared and the appropriate size interbody spacer was packed with DBX mix and tamped into position.  The lower Caspar pin was removed and bone wax was placed in its place and a new Caspar pin was placed into the C3 vertebral body.  Once again, distraction was applied and once again, a thorough and complete C3-4 intervertebral diskectomy and decompression was performed.  After preparing the endplates, the appropriate sized interbody spacer was packed with DBX mix and tamped into position.  The Caspar pins were then removed and bone wax was placed in that place.  The appropriate size anterior cervical plate was placed over the anterior spine.  14 mm variable angle screws were placed, 2 in each vertebral body from C3 to C6, for a total of 8 vertebral body screws.  The screws were then locked to the plate using the Cam locking mechanism.  I then obtained fluoroscopy, and I was very pleased with the press-fit of each of the screws, and I was very pleased with the appearance on the fluoroscopic images.  The wound was then copiously irrigated.  All bleeding was controlled.  The platysma was then closed using 2-0 Vicryl, and the skin was closed using 4-0 Monocryl.  Benzoin and Steri-Strips were applied, followed by sterile dressing.  All instrument counts were correct at the termination of the procedure.  Of note, Jason CoopKayla McKenzie was my assistant throughout surgery and did aid in retraction, suctioning, and closure from start to finish.     Estill BambergMark Deven Furia, MD     MD/MEDQ  D:  07/16/2016  T:  07/17/2016  Job:  [id number]

## 2016-07-17 NOTE — Progress Notes (Signed)
Pt doing well. Pt and daughter given D/C instructions with Rx's, verbal understanding was provided. Pt's incision is clean and dry with no sign of infection. Pt's IV was removed prior to D/C. Pt D/C'd home via wheelchair @ 1140 per MD order. Pt is stable @ D/C and has no other needs at this time. Rema FendtAshley Elfie Costanza, RN

## 2016-07-17 NOTE — Progress Notes (Signed)
    Patient doing well Patient has been ambulating Patient comfortable Has been drinking but not yet able to eat solid foods   Physical Exam: Vitals:   07/16/16 2347 07/17/16 0400  BP: (!) 174/81 (!) 164/82  Pulse: 98 79  Resp: 18 18  Temp: 99.7 F (37.6 C) 99.1 F (37.3 C)    Dressing in place NVI Neck soft/supple  POD #1 s/p C3-C6 ACDF for severe SCC and cervical myelopathy  - encourage ambulation - Percocet for pain, Valium for muscle spasms - d/c home today with f/u in 2 weeks - as patient was again reminded, her SCC was very severe, and her myelopathy symptoms may or may not improve, and will take time, likely months, if improvement does occur. We of course will follow her symptoms in the office.

## 2016-07-24 ENCOUNTER — Telehealth: Payer: Self-pay | Admitting: Neurology

## 2016-07-24 NOTE — Telephone Encounter (Signed)
Patient called to ask what specific nose piece Dr. Frances FurbishAthar recommended for CPAP. Please call [phone].

## 2016-07-24 NOTE — Telephone Encounter (Signed)
I spoke to patient and she is aware of orders. Patient just wanted to verify. I advised that DME will fit her for the correct size.

## 2016-08-05 NOTE — Discharge Summary (Signed)
Patient ID: Tina [name] MRN: [id number] DOB/AGE: 1956/02/27 60 y.o.  Admit date: 07/16/2016 Discharge date: 07/17/2016  Admission Diagnoses:  Active Problems:   Cervical disc disease with myelopathy   Discharge Diagnoses:  Same  Past Medical History:  Diagnosis Date  . Arthritis    "joints; fingers; shoulders; knees; back" (08/27/2012)  . Chronic back pain    epidural injections  . Chronic neck pain    myelopathy  . COPD (chronic obstructive pulmonary disease) (HCC)   . Fibromyalgia    "dx'd long time ago; before they really knew what it was" (112/02/2012)  . GERD (gastroesophageal reflux disease)    takes Protonix daily  . Heart murmur   . History of bronchitis several yrs ago  . History of colon polyps   . Hyperlipidemia    takes Lovastatin daily  . Hypertension    takes Losartan daily  . Joint swelling   . Osteoporosis   . Palpitations    takes Atenolol daily  . Pneumonia 2013   hx of  . Scoliosis   . Sleep apnea    "have a mask but I don't wear it" (08/27/2012)  . Type II diabetes mellitus (HCC)    takes Glipide daily  . Weakness    numbness in hands and feet    Surgeries: Procedure(s): ANTERIOR CERVICAL DECOMPRESSION FUSION CERVICAL 3-4, CERVICAL 4-5, CERVICAL 5-6 WITH INSTRUMENATION AND ALLOGRAFT on 07/16/2016   Consultants:  None Discharged Condition: Improved  Hospital Course: Tina SchirmerJacqueline L Spieth is an [age] y.o. female who was admitted 07/16/2016 for operative treatment of myelopathy. Patient has severe unremitting pain that affects sleep, daily activities, and work/hobbies. After pre-op clearance the patient was taken to the operating room on 07/16/2016 and underwent  Procedure(s): ANTERIOR CERVICAL DECOMPRESSION FUSION CERVICAL 3-4, CERVICAL 4-5, CERVICAL 5-6 WITH INSTRUMENATION AND ALLOGRAFT.    Patient was given perioperative antibiotics:  Anti-infectives    Start     Dose/Rate Route Frequency Ordered Stop   07/16/16 1500  ceFAZolin  (ANCEF) IVPB 1 g/50 mL premix     1 g 100 mL/hr over 30 Minutes Intravenous Every 8 hours 07/16/16 1416 07/16/16 2139   07/16/16 0531  ceFAZolin (ANCEF) IVPB 2g/100 mL premix     2 g 200 mL/hr over 30 Minutes Intravenous On call to O.R. 07/16/16 [id number] 07/16/16 [id number]       Patient was given sequential compression devices, early ambulation to prevent DVT.  Patient benefited maximally from hospital stay and there were no complications.    Recent vital signs: BP (!) 165/76   Pulse 76   Temp 99.3 F (37.4 C)   Resp 18   Ht 5\' 6"  (1.676 m)   Wt 65.9 kg (145 lb 4 oz)   SpO2 97%   BMI 23.44 kg/m   Discharge Medications:     Medication List    STOP taking these medications   acetaminophen 500 MG tablet Commonly known as:  TYLENOL     TAKE these medications   albuterol 108 (90 Base) MCG/ACT inhaler Commonly known as:  PROVENTIL HFA;VENTOLIN HFA Inhale into the lungs every 6 (six) hours as needed for wheezing or shortness of breath.   atenolol 50 MG tablet Commonly known as:  TENORMIN Take 100 mg by mouth at bedtime.   glipiZIDE 10 MG 24 hr tablet Commonly known as:  GLUCOTROL XL Take 10 mg by mouth daily.   hydrALAZINE 10 MG tablet Commonly known as:  APRESOLINE Take 10 mg by mouth  at bedtime.   hydrochlorothiazide 25 MG tablet Commonly known as:  HYDRODIURIL Take 25 mg by mouth daily.   losartan 100 MG tablet Commonly known as:  COZAAR Take 100 mg by mouth every morning.   lovastatin 20 MG tablet Commonly known as:  MEVACOR Take 20 mg by mouth daily at 6 PM.   pantoprazole 20 MG tablet Commonly known as:  PROTONIX Take 20 mg by mouth every morning.       Diagnostic Studies: Dg Cervical Spine 1 View  Result Date: 07/16/2016 CLINICAL DATA:  C3-6 ACDF EXAM: DG C-ARM 61-120 MIN; DG CERVICAL SPINE - 1 VIEW COMPARISON:  MRI 06/19/2016 FINDINGS: Changes of anterior fusion C3-C6. No hardware complicating feature. Normal alignment. IMPRESSION: C3-C6 anterior  fusion.  No complicating feature. Electronically Signed   By: Charlett NoseKevin  Dover M.D.   On: 07/16/2016 13:51   Dg C-arm 1-60 Min  Result Date: 07/16/2016 CLINICAL DATA:  C3-6 ACDF EXAM: DG C-ARM 61-120 MIN; DG CERVICAL SPINE - 1 VIEW COMPARISON:  MRI 06/19/2016 FINDINGS: Changes of anterior fusion C3-C6. No hardware complicating feature. Normal alignment. IMPRESSION: C3-C6 anterior fusion.  No complicating feature. Electronically Signed   By: Charlett NoseKevin  Dover M.D.   On: 07/16/2016 13:51    Disposition: 01-Home or Self Care   POD #1 s/p C3-C6 ACDF for severe SCC and cervical myelopathy  - encourage ambulation - Percocet for pain, Valium for muscle spasms - d/c home today with f/u in 2 weeks - as patient was again reminded, her SCC was very severe, and her myelopathy symptoms may or may not improve, and will take time, likely months, if improvement does occur. We of course will follow her symptoms in the office.   -Written scripts for pain signed and in chart -D/C instructions sheet printed and in chart -D/C today  -F/U in office 2 weeks   Signed: Georga BoraMCKENZIE, Shamere Campas J 08/05/2016, 12:56 PM

## 2016-08-12 ENCOUNTER — Ambulatory Visit
Admission: RE | Admit: 2016-08-12 | Discharge: 2016-08-12 | Disposition: A | Discharge status: Home / Self Care | Payer: Medicaid Other | Source: Ambulatory Visit | Attending: Family Medicine | Admitting: Family Medicine

## 2016-08-12 ENCOUNTER — Other Ambulatory Visit: Payer: Self-pay | Admitting: Internal Medicine

## 2016-08-12 DIAGNOSIS — Z1231 Encounter for screening mammogram for malignant neoplasm of breast: Secondary | ICD-10-CM

## 2016-09-22 HISTORY — PX: CATARACT EXTRACTION, BILATERAL: SHX1313

## 2016-09-24 ENCOUNTER — Telehealth: Payer: Self-pay

## 2016-09-24 NOTE — Telephone Encounter (Signed)
I spoke to patient and she will bring CPAP machine to appt tomorrow.

## 2016-09-25 ENCOUNTER — Ambulatory Visit: Payer: Medicaid Other | Admitting: Neurology

## 2016-09-25 NOTE — Telephone Encounter (Signed)
Pt c/a appt for today due to the weather. Next available appt is 12/15/16, she will need an earlier appt.  Pt said she feels tired when she wakes in the morning. She also advised the left nasal area is sore and she feels air escaping from the nose piece.

## 2016-09-25 NOTE — Telephone Encounter (Signed)
LM for patient stating that we have an appt open on Monday (as of right now). I asked her to call back if she is interested, also stated that she can check back for any other openings. I left the phone # of [facility] and advised that they can help with any mask issues.

## 2016-09-26 NOTE — Telephone Encounter (Signed)
Pt returned RN's call. She has to give transportation a 3 day notice. As of now there is nothing available after 1/9 before her appt on 3/26.

## 2016-09-29 NOTE — Telephone Encounter (Signed)
I left a message with the patient stating that as of right now, there is an opening on the 18th. Also if she cannot find something that will work for her she can also schedule with Aundra MilletMegan NP.

## 2016-12-10 ENCOUNTER — Encounter: Payer: Self-pay | Admitting: Neurology

## 2016-12-12 ENCOUNTER — Other Ambulatory Visit: Payer: Self-pay | Admitting: Orthopaedic Surgery

## 2016-12-12 DIAGNOSIS — M25512 Pain in left shoulder: Secondary | ICD-10-CM

## 2016-12-15 ENCOUNTER — Ambulatory Visit (INDEPENDENT_AMBULATORY_CARE_PROVIDER_SITE_OTHER): Payer: Medicaid Other | Admitting: Neurology

## 2016-12-15 ENCOUNTER — Encounter: Payer: Self-pay | Admitting: Neurology

## 2016-12-15 VITALS — BP 130/68 | HR 80 | Resp 14 | Ht 66.0 in | Wt 142.0 lb

## 2016-12-15 DIAGNOSIS — G4733 Obstructive sleep apnea (adult) (pediatric): Secondary | ICD-10-CM

## 2016-12-15 DIAGNOSIS — Z9989 Dependence on other enabling machines and devices: Secondary | ICD-10-CM

## 2016-12-15 NOTE — Progress Notes (Signed)
Subjective:    Patient ID: Tina [name] is a 61 y.o. female.  HPI     Interim history:   Tina [name] is a 61 year-old right-handed woman with an underlying medical history of osteopenia, hyperlipidemia, type 2 diabetes, hypertension, reflux disease, arthritis, status post left femoral neck fracture, status post hemiarthroplasty of left hip, COPD, smoking, and overweight state, who presents for follow-up consultation of her obstructive sleep apnea. The patient is unaccompanied today. I last saw her on 06/12/2016, at which time we talked about her sleep study results from 05/21/2016. I suggested we proceed with AutoPap trial at home. She reported left shoulder pain. She was scheduled for cervical spine surgery on 06/25/2016.   Today, 12/15/2016: I reviewed her AutoPap compliance data from 11/11/2016 through 12/10/2016, which is a total of 30 days, during which time she used her AutoPap 29 days with percent used days greater than 4 hours at 47%, indicating suboptimal compliance with an average usage of 4 hours and 17 minutes, residual AHI 0.4 per hour, 95th percentile of pressure of 9.5 cm, leak on the higher side with the 95th percentile at 23.5 L/m, range of 4-12 cm with EPR. She reports doing okay with autoPAP.  She had neck surgery on 07/16/17 under Dr. Jonni Sanger, and she was D/C the next day, no narcotic pain medication at this time. She is still bothered by L shoulder pain, scheduled for MRI L shoulder on 01/01/17, sees Dr. Rhona Raider.  As far as sleeping with AutoPap, she sometimes does not put her mask back on after she gets up to the bathroom in the mornings, she does note mild improvement in daytime somnolence and morning headaches.  The patient's allergies, current medications, family history, past medical history, past social history, past surgical history and problem list were reviewed and updated as appropriate.   Previously (copied from previous notes for reference):   I first met her  on 05/08/2016 at the request of her primary care provider, at which time she reported a prior diagnosis of obstructive sleep apnea and CPAP therapy for years but no recent use of CPAP and a history of recent difficult to control hypertension. I invited her back for sleep study. She had a baseline sleep study on 05/21/2016. Sleep efficiency was markedly reduced at 29.3% with long latency to sleep of 210.5 minutes and wake after sleep onset of 96 minutes. She had an increased percentage of stage II sleep, absence of slow-wave sleep and REM sleep at 31.9% with a REM latency of 57 minutes. She had no significant PLMS, EKG or EEG changes. She had intermittent snoring. Total AHI was 6.6 per hour, rising to 19.3 per hour during REM sleep. Average oxygen saturation was 93%, nadir was 87%.    05/08/2016: She was diagnosed with obstructive sleep apneas several years ago and placed on CPAP therapy. Her machine is several years old and she has not used it in years. She has had difficult to control hypertension. Prior sleep study results are not available for my review today. I reviewed your office note from 12/20/2015. She does snore, per children, sometimes wakes herself up with gasping and snoring. Her Epworth sleepiness score is 10 out of 24 today, her fatigue score is 51 out of 63 today. She does not sleep well through the night. She has nocturia about twice per night. She goes to bed between 11 and 11:30 PM and wakeup time is around 7:30. She does not wake up rested. She does not typically  take a scheduled nap but does dose off sometimes for a few minutes.  She has occasional morning headaches. She has neck pain. She has been seeing orthopedics for this. She is going to have injections into her neck next week.  She would be willing to get retested for sleep apnea and she would be willing to try CPAP again. She uses the CPAP only for a few weeks or a few months. She lives alone. She is single, has 2 grown children, is  currently not employed. She worked for [employer] in [place].  She does not drink caffeine daily, she smokes half a pack per day, she does not drink alcohol. She denies restless leg symptoms or leg twitching at night. Compared to her sleep study several years ago she believes she has lost weight   Her Past Medical History Is Significant For: Past Medical History:  Diagnosis Date  . Arthritis    "joints; fingers; shoulders; knees; back" (08/27/2012)  . Chronic back pain    epidural injections  . Chronic neck pain    myelopathy  . COPD (chronic obstructive pulmonary disease) ([place])   . Fibromyalgia    "dx'd long time ago; before they really knew what it was" (112/02/2012)  . GERD (gastroesophageal reflux disease)    takes Protonix daily  . Heart murmur   . History of bronchitis several yrs ago  . History of colon polyps   . Hyperlipidemia    takes Lovastatin daily  . Hypertension    takes Losartan daily  . Joint swelling   . Osteoporosis   . Palpitations    takes Atenolol daily  . Pneumonia 2013   hx of  . Scoliosis   . Sleep apnea    "have a mask but I don't wear it" (08/27/2012)  . Type II diabetes mellitus ([facility])    takes Glipide daily  . Weakness    numbness in hands and feet    Her Past Surgical History Is Significant For: Past Surgical History:  Procedure Laterality Date  . ANTERIOR CERVICAL DECOMP/DISCECTOMY FUSION N/A 07/16/2016   Procedure: ANTERIOR CERVICAL DECOMPRESSION FUSION CERVICAL 3-4, CERVICAL 4-5, CERVICAL 5-6 WITH INSTRUMENATION AND ALLOGRAFT;  Surgeon: Phylliss Bob, MD;  Location: [facility];  Service: Orthopedics;  Laterality: N/A;  ANTERIOR CERVICAL DECOMPRESSION FUSION CERVICAL 3-4, CERVICAL 4-5, CERVICAL 5-6 WITH INSTRUMENATION AND ALLOGRAFT  . COLONOSCOPY    . ESOPHAGOGASTRODUODENOSCOPY    . fatty tumor removed from right leg     as a teenager  . HIP ARTHROPLASTY  08/27/2012   Procedure: ARTHROPLASTY BIPOLAR HIP;  Surgeon: Hessie Dibble, MD;  Location: [facility];  Service: Orthopedics;  Laterality: Left;    Her Family History Is Significant For: Family History  Problem Relation Age of Onset  . Breast cancer    . Throat cancer    . Lung cancer      Her Social History Is Significant For: Social History   Social History  . Marital status: Divorced    Spouse name: N/A  . Number of children: N/A  . Years of education: N/A   Social History Main Topics  . Smoking status: Current Every Day Smoker    Packs/day: 0.50    Years: 30.00    Types: Cigarettes  . Smokeless tobacco: Never Used  . Alcohol use No  . Drug use: No  . Sexual activity: No   Other Topics Concern  . None   Social History Narrative  . None  Her Allergies Are:  Allergies  Allergen Reactions  . Hydrocodone-Acetaminophen Palpitations and Other (See Comments)    Other reaction(s): Dizziness  chest pain  . Tramadol Other (See Comments)    "It makes my chest feel funny; I don't like to take that"  . Lisinopril Other (See Comments)    cough  :   Her Current Medications Are:  Outpatient Encounter Prescriptions as of 12/15/2016  Medication Sig  . albuterol (PROVENTIL HFA;VENTOLIN HFA) 108 (90 Base) MCG/ACT inhaler Inhale into the lungs every 6 (six) hours as needed for wheezing or shortness of breath.  Marland Kitchen atenolol (TENORMIN) 50 MG tablet Take 100 mg by mouth at bedtime.   Marland Kitchen glipiZIDE (GLUCOTROL XL) 10 MG 24 hr tablet Take 10 mg by mouth daily.  . hydrochlorothiazide (HYDRODIURIL) 25 MG tablet Take 25 mg by mouth daily.  Marland Kitchen losartan (COZAAR) 100 MG tablet Take 100 mg by mouth every morning.   . pantoprazole (PROTONIX) 20 MG tablet Take 20 mg by mouth every morning.   . [DISCONTINUED] hydrALAZINE (APRESOLINE) 10 MG tablet Take 10 mg by mouth at bedtime.   . [DISCONTINUED] lovastatin (MEVACOR) 20 MG tablet Take 20 mg by mouth daily at 6 PM.    No facility-administered encounter medications on file as of 12/15/2016.   :  Review of Systems:   Out of a complete 14 point review of systems, all are reviewed and negative with the exception of these symptoms as listed below:  Review of Systems  Neurological:       Patient states that she is doing ok with CPAP. No  New concerns.     Objective:  Neurologic Exam  Physical Exam Physical Examination:   Vitals:   12/15/16 1133  BP: 130/68  Pulse: 80  Resp: 14   General Examination: The patient is a very pleasant 61 y.o. female in no acute distress. She appears well-developed and well-nourished and well groomed.   HEENT: Normocephalic, atraumatic, pupils are equal, round and reactive to light and accommodation. Extraocular tracking is good without limitation to gaze excursion or nystagmus noted. Normal smooth pursuit is noted. Hearing is grossly intact. Face is symmetric with normal facial animation and normal facial sensation. Speech is clear with no dysarthria noted. There is no hypophonia. There is no lip, neck/head, jaw or voice tremor. Neck is supple with Mild decrease in range of motion side to side. She has a well-healed anterior cervical scar from her neck surgery. Airway examination reveals mild mouth dryness, adequate dental hygiene, moderate airway crowding. Tongue protrudes centrally and palate elevates symmetrically. Tonsils are 2+ in size.   Chest: Clear to auscultation without wheezing, rhonchi or crackles noted.  Heart: S1+S2+0, regular and normal without murmurs, rubs or gallops noted.   Abdomen: Soft, non-tender and non-distended with normal bowel sounds appreciated on auscultation.  Extremities: There is no pitting edema in the distal lower extremities bilaterally. Pedal pulses are intact.  Skin: Warm and dry without trophic changes noted.  Musculoskeletal: exam reveals no obvious joint deformities, tenderness or joint swelling or erythema. Decreased range of motion left shoulder and tenderness reported with movement.   Neurologically:  Mental status: The  patient is awake, alert and oriented in all 4 spheres. Her immediate and remote memory, attention, language skills and fund of knowledge are appropriate. There is no evidence of aphasia, agnosia, apraxia or anomia. Speech is clear with normal prosody and enunciation. Thought process is linear. Mood is normal and affect is normal.  Cranial nerves II -  XII are as described above under HEENT exam. In addition: shoulder shrug is normal with equal shoulder height noted. Motor exam: Normal bulk, strength and tone is noted. There is no drift, tremor or rebound. Romberg is negative. Reflexes are 1-2+ throughout. Fine motor skills and coordination: intact.  Cerebellar testing: No dysmetria or intention tremor.   Sensory exam: intact to light touch in the upper and lower extremities.  Gait, station and balance: She stands easily. No veering to one side is noted. No leaning to one side is noted. Posture is age-appropriate and stance is narrow based. Gait shows normal stride length and normal pace. No problems turning are noted. Tandem walk is unremarkable.    Assessment and Plan:  In summary, LUWANDA STARR is a very pleasant 61 y.o.-year old female with an underlying medical history of osteopenia, hyperlipidemia, type 2 diabetes, hypertension, reflux disease, arthritis, status post left femoral neck fracture, status post hemiarthroplasty of left hip, COPD, smoking, and overweight state, who presents for follow-up consultation of her obstructive sleep apnea, on AutoPap therapy. She had a baseline sleep study on 05/21/2016 which showed mild to moderate OSA, decreased sleep efficiency, with total sleep time of just a little over 2 hours. She has been on AutoPap therapy with reasonably good adjustment and reasonable compliance, she uses the machine with a hist almost every night but does not keep it on all night, has a tendency to take it off in the early morning hours and sleeps on without it. She is encouraged to  use it through the night especially since she does have worsening of her sleep apnea during REM sleep and REM is more pronounced in the latter part of the night. Physical exam is stable. She is indicating some improvement in her daytime somnolence and morning headaches. She is encouraged to continue with treatment. She is advised to follow-up in about 6 months, she can see one of our nurse practitioners at the time. I answered all her questions today and she was in agreement.  I spent 25 minutes in total face-to-face time with the patient, more than 50% of which was spent in counseling and coordination of care, reviewing test results, reviewing medication and discussing or reviewing the diagnosis of OSA, its prognosis and treatment options. Pertinent laboratory and imaging test results that were available during this visit with the patient were reviewed by me and considered in my medical decision making (see chart for details).

## 2016-12-15 NOTE — Patient Instructions (Addendum)
Please continue using your autoPAP regularly. While your insurance requires that you use PAP at least 4 hours each night on 70% of the nights, I recommend, that you not skip any nights and use it throughout the night if you can. Getting used to PAP and staying with the treatment long term does take time and patience and discipline. Untreated obstructive sleep apnea when it is moderate to severe can have an adverse impact on cardiovascular health and raise her risk for heart disease, arrhythmias, hypertension, congestive heart failure, stroke and diabetes. Untreated obstructive sleep apnea causes sleep disruption, nonrestorative sleep, and sleep deprivation. This can have an impact on your day to day functioning and cause daytime sleepiness and impairment of cognitive function, memory loss, mood disturbance, and problems focussing. Using PAP regularly can improve these symptoms. Keep up the good work! We will see you back in 6 months for sleep apnea check up, you can see one of our NPs at the time.

## 2017-01-01 ENCOUNTER — Ambulatory Visit
Admission: RE | Admit: 2017-01-01 | Discharge: 2017-01-01 | Disposition: A | Discharge status: Home / Self Care | Payer: Medicaid Other | Source: Ambulatory Visit | Attending: Orthopaedic Surgery | Admitting: Orthopaedic Surgery

## 2017-01-01 DIAGNOSIS — M25512 Pain in left shoulder: Secondary | ICD-10-CM

## 2017-02-26 ENCOUNTER — Emergency Department (HOSPITAL_COMMUNITY)
Admission: EM | Admit: 2017-02-26 | Discharge: 2017-02-26 | Disposition: A | Discharge status: Home / Self Care | Payer: Medicaid Other | Attending: Emergency Medicine | Admitting: Emergency Medicine

## 2017-02-26 DIAGNOSIS — Z7984 Long term (current) use of oral hypoglycemic drugs: Secondary | ICD-10-CM | POA: Insufficient documentation

## 2017-02-26 DIAGNOSIS — K625 Hemorrhage of anus and rectum: Secondary | ICD-10-CM | POA: Diagnosis not present

## 2017-02-26 DIAGNOSIS — Z79899 Other long term (current) drug therapy: Secondary | ICD-10-CM | POA: Insufficient documentation

## 2017-02-26 DIAGNOSIS — Z96642 Presence of left artificial hip joint: Secondary | ICD-10-CM | POA: Insufficient documentation

## 2017-02-26 DIAGNOSIS — J449 Chronic obstructive pulmonary disease, unspecified: Secondary | ICD-10-CM | POA: Diagnosis not present

## 2017-02-26 DIAGNOSIS — F17210 Nicotine dependence, cigarettes, uncomplicated: Secondary | ICD-10-CM | POA: Diagnosis not present

## 2017-02-26 DIAGNOSIS — E119 Type 2 diabetes mellitus without complications: Secondary | ICD-10-CM | POA: Insufficient documentation

## 2017-02-26 LAB — CBC
HEMATOCRIT: 41.6 % (ref 36.0–46.0)
Hemoglobin: 13.6 g/dL (ref 12.0–15.0)
MCH: 32.1 pg (ref 26.0–34.0)
MCHC: 32.7 g/dL (ref 30.0–36.0)
MCV: 98.1 fL (ref 78.0–100.0)
PLATELETS: 205 10*3/uL (ref 150–400)
RBC: 4.24 MIL/uL (ref 3.87–5.11)
RDW: 13.7 % (ref 11.5–15.5)
WBC: 6.8 10*3/uL (ref 4.0–10.5)

## 2017-02-26 LAB — COMPREHENSIVE METABOLIC PANEL
ALT: 26 U/L (ref 14–54)
AST: 62 U/L — ABNORMAL HIGH (ref 15–41)
Albumin: 4.4 g/dL (ref 3.5–5.0)
Alkaline Phosphatase: 62 U/L (ref 38–126)
Anion gap: 10 (ref 5–15)
BUN: 15 mg/dL (ref 6–20)
CHLORIDE: 107 mmol/L (ref 101–111)
CO2: 21 mmol/L — AB (ref 22–32)
CREATININE: 1.09 mg/dL — AB (ref 0.44–1.00)
Calcium: 9 mg/dL (ref 8.9–10.3)
GFR calc non Af Amer: 54 mL/min — ABNORMAL LOW (ref >60)
Glucose, Bld: 135 mg/dL — ABNORMAL HIGH (ref 65–99)
POTASSIUM: 4.9 mmol/L (ref 3.5–5.1)
SODIUM: 138 mmol/L (ref 135–145)
Total Bilirubin: 2.1 mg/dL — ABNORMAL HIGH (ref 0.3–1.2)
Total Protein: 6.4 g/dL — ABNORMAL LOW (ref 6.5–8.1)

## 2017-02-26 LAB — TYPE AND SCREEN
ABO/RH(D): O POS
Antibody Screen: NEGATIVE

## 2017-02-26 LAB — POC OCCULT BLOOD, ED: FECAL OCCULT BLD: NEGATIVE

## 2017-02-26 NOTE — ED Notes (Signed)
Upon DC pt had several questions as to why she was leaving. Melvenia BeamShari PA and this RN at bedside and explained several times that based on labs, vitals, exam, etc that there is no indication for a CT scan of abd or admission. Pt was stable for DC and needs to follow up outpt with GI. This was also explained to pt daughter via telephone.

## 2017-02-26 NOTE — ED Notes (Signed)
Pt ambulatory to restroom, steady gait.

## 2017-02-26 NOTE — ED Triage Notes (Signed)
Per Pt, Pt reports having some abdominal cramping x1 month. Today, pt noted to have some blood in her bowel movement. No hx f the same. Denies urinary changes.

## 2017-02-26 NOTE — Discharge Instructions (Signed)
Call your GI doctor in the morning and schedule a time to be seen for further evaluation of rectal bleeding. Continue your Protonix for symptoms of indigestion. Return here with any high fever, severe pain, significant increase to bleeding or for new concern.

## 2017-02-26 NOTE — ED Provider Notes (Signed)
[facility]-EMERGENCY DEPT Provider Note   CSN: [id number] Arrival date & time: 02/26/17  1717     History   Chief Complaint Chief Complaint  Patient presents with  . GI Bleeding    HPI Tina [name] is a 61 y.o. female.  Patient with history of HTN, HLD, COPD, GERD, presents for evaluation of blood per rectum x 1 day. She reports abdominal cramping that is mild. No nausea, vomiting, fever. She reports colonoscopy every 3 years secondary to benign polyps. She has seen blood per rectum x 2 only. No lightheadedness, headache, syncope.   The history is provided by the patient. No language interpreter was used.    Past Medical History:  Diagnosis Date  . Arthritis    "joints; fingers; shoulders; knees; back" (08/27/2012)  . Chronic back pain    epidural injections  . Chronic neck pain    myelopathy  . COPD (chronic obstructive pulmonary disease) ([facility])   . Fibromyalgia    "dx'd long time ago; before they really knew what it was" (112/02/2012)  . GERD (gastroesophageal reflux disease)    takes Protonix daily  . Heart murmur   . History of bronchitis several yrs ago  . History of colon polyps   . Hyperlipidemia    takes Lovastatin daily  . Hypertension    takes Losartan daily  . Joint swelling   . Osteoporosis   . Palpitations    takes Atenolol daily  . Pneumonia 2013   hx of  . Scoliosis   . Sleep apnea    "have a mask but I don't wear it" (08/27/2012)  . Type II diabetes mellitus ([facility])    takes Glipide daily  . Weakness    numbness in hands and feet    Patient Active Problem List   Diagnosis Date Noted  . Cervical disc disease with myelopathy 07/16/2016  . Femoral fracture ([facility]) 08/27/2012  . Tobacco abuse 08/27/2012  . Diabetes ([facility]) 08/27/2012  . Hyperlipidemia 08/27/2012  . PAF (paroxysmal atrial fibrillation) ([facility]) 08/27/2012  . COPD (chronic obstructive pulmonary disease) ([facility]) 08/27/2012  . GERD (gastroesophageal reflux disease) 08/27/2012  . Vitamin  deficiency 08/27/2012    Past Surgical History:  Procedure Laterality Date  . ANTERIOR CERVICAL DECOMP/DISCECTOMY FUSION N/A 07/16/2016   Procedure: ANTERIOR CERVICAL DECOMPRESSION FUSION CERVICAL 3-4, CERVICAL 4-5, CERVICAL 5-6 WITH INSTRUMENATION AND ALLOGRAFT;  Surgeon: Estill BambergMark Dumonski, MD;  Location: [facility];  Service: Orthopedics;  Laterality: N/A;  ANTERIOR CERVICAL DECOMPRESSION FUSION CERVICAL 3-4, CERVICAL 4-5, CERVICAL 5-6 WITH INSTRUMENATION AND ALLOGRAFT  . COLONOSCOPY    . ESOPHAGOGASTRODUODENOSCOPY    . fatty tumor removed from right leg     as a teenager  . HIP ARTHROPLASTY  08/27/2012   Procedure: ARTHROPLASTY BIPOLAR HIP;  Surgeon: Velna OchsPeter G Dalldorf, MD;  Location: [facility];  Service: Orthopedics;  Laterality: Left;    OB History    No data available       Home Medications    Prior to Admission medications   Medication Sig Start Date End Date Taking? Authorizing Provider  albuterol (PROVENTIL HFA;VENTOLIN HFA) 108 (90 Base) MCG/ACT inhaler Inhale into the lungs every 6 (six) hours as needed for wheezing or shortness of breath.    [provider]  atenolol (TENORMIN) 50 MG tablet Take 100 mg by mouth at bedtime.  06/20/16   [provider]  glipiZIDE (GLUCOTROL XL) 10 MG 24 hr tablet Take 10 mg by mouth daily.    [provider]  hydrochlorothiazide (  HYDRODIURIL) 25 MG tablet Take 25 mg by mouth daily.    [provider]  losartan (COZAAR) 100 MG tablet Take 100 mg by mouth every morning.     [provider]  pantoprazole (PROTONIX) 20 MG tablet Take 20 mg by mouth every morning.  05/10/15   [provider]    Family History Family History  Problem Relation Age of Onset  . Breast cancer Unknown   . Throat cancer Unknown   . Lung cancer Unknown     Social History Social History  Substance Use Topics  . Smoking status: Current Every Day Smoker    Packs/day: 0.50    Years: 30.00    Types: Cigarettes  . Smokeless  tobacco: Never Used  . Alcohol use No     Allergies   Hydrocodone-acetaminophen; Tramadol; Cortizone-10 [hydrocortisone]; and Lisinopril   Review of Systems Review of Systems  Constitutional: Negative for chills and fever.  Respiratory: Negative.   Cardiovascular: Negative.   Gastrointestinal: Positive for blood in stool. Negative for nausea and vomiting.       C/o abdominal cramping.  Musculoskeletal: Negative.   Skin: Negative.  Negative for pallor.  Neurological: Negative.  Negative for weakness and light-headedness.     Physical Exam Updated Vital Signs BP (!) 176/67   Pulse 64   Temp 99 F (37.2 C) (Oral)   Resp (!) 22   Ht 5\' 6"  (1.676 m)   Wt 64.4 kg (142 lb)   SpO2 100%   BMI 22.92 kg/m   Physical Exam  Constitutional: She is oriented to person, place, and time. She appears well-developed and well-nourished.  HENT:  Head: Normocephalic.  Eyes:  No conjunctival pallor.  Neck: Normal range of motion. Neck supple.  Cardiovascular: Normal rate and regular rhythm.   No murmur heard. Pulmonary/Chest: Effort normal and breath sounds normal. She has no wheezes. She has no rales.  Abdominal: Soft. Bowel sounds are normal. There is no tenderness. There is no rebound and no guarding.  Genitourinary: Rectal exam shows guaiac negative stool.  Genitourinary Comments: Rectal exam shows no fissure or hemorrhoid. Small amount of stool in rectal vault that appear normal in color. No frank blood.  Musculoskeletal: Normal range of motion.  Neurological: She is alert and oriented to person, place, and time.  Skin: Skin is warm and dry. No rash noted.  Psychiatric: She has a normal mood and affect.     ED Treatments / Results  Labs (all labs ordered are listed, but only abnormal results are displayed) Labs Reviewed  COMPREHENSIVE METABOLIC PANEL - Abnormal; Notable for the following:       Result Value   CO2 21 (*)    Glucose, Bld 135 (*)    Creatinine, Ser 1.09 (*)      Total Protein 6.4 (*)    AST 62 (*)    Total Bilirubin 2.1 (*)    GFR calc non Af Amer 54 (*)    All other components within normal limits  CBC  POC OCCULT BLOOD, ED  TYPE AND SCREEN    EKG  EKG Interpretation None       Radiology No results found.  Procedures Procedures (including critical care time)  Medications Ordered in ED Medications - No data to display   Initial Impression / Assessment and Plan / ED Course  I have reviewed the triage vital signs and the nursing notes.  Pertinent labs & imaging results that were available during my care of the  patient were reviewed by me and considered in my medical decision making (see chart for details).     Patient here for evaluation of GI bleeding x 2 today. Minimal pain. No nausea or vomiting. She reports increased indigestion. Rectal bleeding is described as bright red in appearance.   VSS. Patient is hemodynamically stable, Hgb 13.6. Fecal occult negative. She can be discharged home with GI follow up outpatient.   Final Clinical Impressions(s) / ED Diagnoses   Final diagnoses:  None   1. Rectal bleeding  New Prescriptions New Prescriptions   No medications on file     Elpidio Anis, Cordelia Poche 02/26/17 2319    Elpidio Anis, PA-C 02/26/17 2320    Long, Arlyss Repress, MD 02/27/17 [id number]

## 2017-02-26 NOTE — ED Notes (Signed)
EDP at bedside  

## 2017-04-30 ENCOUNTER — Telehealth: Payer: Self-pay

## 2017-04-30 NOTE — Telephone Encounter (Signed)
Received this notice from [facility]: "This patient was denied Re-Authorization for Cpap from Medicaid due to only 5 compliant days in the last 90 days.  She has  agreed to turn it in as she is unable to use it due to other medical complications.  She is aware that she will need to follow up with Dr. Frances FurbishAthar and get a new Sleep study to re-qualify once she is ready to start Cpap therapy again. "

## 2017-05-15 ENCOUNTER — Other Ambulatory Visit: Payer: Self-pay | Admitting: Orthopedic Surgery

## 2017-05-15 DIAGNOSIS — M542 Cervicalgia: Secondary | ICD-10-CM

## 2017-05-18 HISTORY — PX: CATARACT EXTRACTION: SUR2

## 2017-06-09 ENCOUNTER — Encounter: Payer: Self-pay | Admitting: Gastroenterology

## 2017-06-18 ENCOUNTER — Encounter: Payer: Self-pay | Admitting: Adult Health

## 2017-06-18 ENCOUNTER — Ambulatory Visit (INDEPENDENT_AMBULATORY_CARE_PROVIDER_SITE_OTHER): Payer: Medicaid Other | Admitting: Adult Health

## 2017-06-18 VITALS — BP 133/70 | HR 87 | Wt 135.8 lb

## 2017-06-18 DIAGNOSIS — G4733 Obstructive sleep apnea (adult) (pediatric): Secondary | ICD-10-CM

## 2017-06-18 DIAGNOSIS — Z9989 Dependence on other enabling machines and devices: Secondary | ICD-10-CM | POA: Diagnosis not present

## 2017-06-18 NOTE — Progress Notes (Addendum)
PATIENT: Tina [name] DOB: Jan 16, 1956  REASON FOR VISIT: follow up HISTORY FROM: patient  HISTORY OF PRESENT ILLNESS: Today 06/18/17 Tina [name] is a 61 year old female with a history of obstructive sleep apnea on CPAP. She returns today for follow-up. She reports that her insurance company took away her CPAP machine due to lack of usage. She states that she was having a hard time using it due to other health conditions but also because she would forget to put it on. She states that she did feel better when she was using it. She does have some daytime sleepiness. She also reports that she suffers from insomnia and does not sleep well at night. Reports that her primary care advised that she should follow back with our office to restart CPAP if necessary.  HISTORY Tina [name] is a 61 year-old right-handed woman with an underlying medical history of osteopenia, hyperlipidemia, type 2 diabetes, hypertension, reflux disease, arthritis, status post left femoral neck fracture, status post hemiarthroplasty of left hip, COPD, smoking, and overweight state, who presents for follow-up consultation of her obstructive sleep apnea. The patient is unaccompanied today. I last saw her on 06/12/2016, at which time we talked about her sleep study results from 05/21/2016. I suggested we proceed with AutoPap trial at home. She reported left shoulder pain. She was scheduled for cervical spine surgery on 06/25/2016.    12/15/2016: I reviewed her AutoPap compliance data from 11/11/2016 through 12/10/2016, which is a total of 30 days, during which time she used her AutoPap 29 days with percent used days greater than 4 hours at 47%, indicating suboptimal compliance with an average usage of 4 hours and 17 minutes, residual AHI 0.4 per hour, 95th percentile of pressure of 9.5 cm, leak on the higher side with the 95th percentile at 23.5 L/m, range of 4-12 cm with EPR. She reports doing okay with autoPAP.  She had neck  surgery on 07/16/17 under Dr. Rosanne Gutting, and she was D/C the next day, no narcotic pain medication at this time. She is still bothered by L shoulder pain, scheduled for MRI L shoulder on 01/01/17, sees Dr. Jerl Santos.  As far as sleeping with AutoPap, she sometimes does not put her mask back on after she gets up to the bathroom in the mornings, she does note mild improvement in daytime somnolence and morning headaches.  The patient's allergies, current medications, family history, past medical history, past social history, past surgical history and problem list were reviewed and updated as appropriate.    REVIEW OF SYSTEMS: Out of a complete 14 system review of symptoms, the patient complains only of the following symptoms, and all other reviewed systems are negative.  See HPI  ALLERGIES: Allergies  Allergen Reactions  . Hydrocodone-Acetaminophen Palpitations and Other (See Comments)    Other reaction(s): Dizziness  chest pain  . Tramadol Other (See Comments)    "It makes my chest feel funny; I don't like to take that"  . Cortizone-10 [Hydrocortisone]     Shots- Headaches   . Lisinopril Other (See Comments)    cough    HOME MEDICATIONS: Outpatient Medications Prior to Visit  Medication Sig Dispense Refill  . albuterol (PROVENTIL HFA;VENTOLIN HFA) 108 (90 Base) MCG/ACT inhaler Inhale into the lungs every 6 (six) hours as needed for wheezing or shortness of breath.    Marland Kitchen atenolol (TENORMIN) 50 MG tablet Take 100 mg by mouth at bedtime.   6  . glipiZIDE (GLUCOTROL XL) 10 MG 24 hr tablet  Take 10 mg by mouth daily.    . hydrochlorothiazide (HYDRODIURIL) 25 MG tablet Take 25 mg by mouth daily.    Marland Kitchen losartan (COZAAR) 100 MG tablet Take 100 mg by mouth every morning.     . pantoprazole (PROTONIX) 20 MG tablet Take 20 mg by mouth every morning.      No facility-administered medications prior to visit.     PAST MEDICAL HISTORY: Past Medical History:  Diagnosis Date  . Arthritis    "joints;  fingers; shoulders; knees; back" (08/27/2012)  . Chronic back pain    epidural injections  . Chronic neck pain    myelopathy  . COPD (chronic obstructive pulmonary disease) (HCC)   . Fibromyalgia    "dx'd long time ago; before they really knew what it was" (112/02/2012)  . GERD (gastroesophageal reflux disease)    takes Protonix daily  . Heart murmur   . History of bronchitis several yrs ago  . History of colon polyps   . Hyperlipidemia    takes Lovastatin daily  . Hypertension    takes Losartan daily  . Joint swelling   . Osteoporosis   . Palpitations    takes Atenolol daily  . Pneumonia 2013   hx of  . Scoliosis   . Sleep apnea    "have a mask but I don't wear it" (08/27/2012)  . Type II diabetes mellitus (HCC)    takes Glipide daily  . Weakness    numbness in hands and feet    PAST SURGICAL HISTORY: Past Surgical History:  Procedure Laterality Date  . ANTERIOR CERVICAL DECOMP/DISCECTOMY FUSION N/A 07/16/2016   Procedure: ANTERIOR CERVICAL DECOMPRESSION FUSION CERVICAL 3-4, CERVICAL 4-5, CERVICAL 5-6 WITH INSTRUMENATION AND ALLOGRAFT;  Surgeon: Estill Bamberg, MD;  Location: [facility] OR;  Service: Orthopedics;  Laterality: N/A;  ANTERIOR CERVICAL DECOMPRESSION FUSION CERVICAL 3-4, CERVICAL 4-5, CERVICAL 5-6 WITH INSTRUMENATION AND ALLOGRAFT  . COLONOSCOPY    . ESOPHAGOGASTRODUODENOSCOPY    . fatty tumor removed from right leg     as a teenager  . HIP ARTHROPLASTY  08/27/2012   Procedure: ARTHROPLASTY BIPOLAR HIP;  Surgeon: Velna Ochs, MD;  Location: [facility] OR;  Service: Orthopedics;  Laterality: Left;    FAMILY HISTORY: Family History  Problem Relation Age of Onset  . Breast cancer Unknown   . Throat cancer Unknown   . Lung cancer Unknown     SOCIAL HISTORY: Social History   Social History  . Marital status: Divorced    Spouse name: N/A  . Number of children: N/A  . Years of education: N/A   Occupational History  . Not on file.   Social History Main Topics    . Smoking status: Current Every Day Smoker    Packs/day: 0.50    Years: 30.00    Types: Cigarettes  . Smokeless tobacco: Never Used  . Alcohol use No  . Drug use: No  . Sexual activity: No   Other Topics Concern  . Not on file   Social History Narrative  . No narrative on file      PHYSICAL EXAM  Vitals:   06/18/17 1046  BP: 133/70  Pulse: 87  Weight: 135 lb 12.8 oz (61.6 kg)   Body mass index is 21.92 kg/m.  Generalized: Well developed, in no acute distress   Neurological examination  Mentation: Alert oriented to time, place, history taking. Follows all commands speech and language fluent Cranial nerve II-XII: Pupils were equal round reactive to light. Extraocular movements  were full, visual field were full on confrontational test. Facial sensation and strength were normal. Uvula tongue midline. Head turning and shoulder shrug  were normal and symmetric. Motor: The motor testing reveals 5 over 5 strength of all 4 extremities. Good symmetric motor tone is noted throughout.  Sensory: Sensory testing is intact to soft touch on all 4 extremities. No evidence of extinction is noted.  Coordination: Cerebellar testing reveals good finger-nose-finger and heel-to-shin bilaterally.  Gait and station: Gait is normal.    DIAGNOSTIC DATA (LABS, IMAGING, TESTING) - I reviewed patient records, labs, notes, testing and imaging myself where available.  Lab Results  Component Value Date   WBC 6.8 02/26/2017   HGB 13.6 02/26/2017   HCT 41.6 02/26/2017   MCV 98.1 02/26/2017   PLT 205 02/26/2017      Component Value Date/Time   NA 138 02/26/2017 1747   K 4.9 02/26/2017 1747   CL 107 02/26/2017 1747   CO2 21 (L) 02/26/2017 1747   GLUCOSE 135 (H) 02/26/2017 1747   BUN 15 02/26/2017 1747   CREATININE 1.09 (H) 02/26/2017 1747   CALCIUM 9.0 02/26/2017 1747   PROT 6.4 (L) 02/26/2017 1747   ALBUMIN 4.4 02/26/2017 1747   AST 62 (H) 02/26/2017 1747   ALT 26 02/26/2017 1747    ALKPHOS 62 02/26/2017 1747   BILITOT 2.1 (H) 02/26/2017 1747   GFRNONAA 54 (L) 02/26/2017 1747   GFRAA >60 02/26/2017 1747   Lab Results  Component Value Date   CHOL 100 08/28/2012   HDL 39 (L) 08/28/2012   LDLCALC 42 08/28/2012   TRIG 93 08/28/2012   CHOLHDL 2.6 08/28/2012   Lab Results  Component Value Date   HGBA1C 5.9 (H) 06/19/2016      ASSESSMENT AND PLAN 61 y.o. year old female  has a past medical history of Arthritis; Chronic back pain; Chronic neck pain; COPD (chronic obstructive pulmonary disease) (HCC); Fibromyalgia; GERD (gastroesophageal reflux disease); Heart murmur; History of bronchitis (several yrs ago); History of colon polyps; Hyperlipidemia; Hypertension; Joint swelling; Osteoporosis; Palpitations; Pneumonia (2013); Scoliosis; Sleep apnea; Type II diabetes mellitus (HCC); and Weakness. here with:  1. Obstructive sleep apnea  The patient doesn't want to restart CPAP therapy if possible. I discussed this with Dr. Frances Furbish. The patient has lost a significant amount of weight and therefore we do repeat a sleep study to verify if she still has OSA. Patient is amenable to having a sleep study. She will follow-up if needed after the sleep study  I spent 15 minutes with the patient. 50% of this time was spent discussing CPAP therapy.    Butch Penny, MSN, NP-C 06/18/2017, 10:40 AM [facility] [address] [place], [place] [address] [phone]  I reviewed the above note and documentation by the Nurse Practitioner and agree with the history, physical exam, assessment and plan as outlined above. I was immediately available for face-to-face consultation. Huston Foley, MD, PhD [facility])

## 2017-06-18 NOTE — Patient Instructions (Addendum)
Your Plan:  Repeat sleep study  If your symptoms worsen or you develop new symptoms please let us know.   Thank you for coming to see [name] at [facility]. I hope we have been able to provide you high quality care today.  You may receive a patient satisfaction survey over the next few weeks. We would appreciate your feedback and comments so that we may continue to improve ourselves and the health of our patients.

## 2017-07-01 ENCOUNTER — Other Ambulatory Visit: Payer: Self-pay | Admitting: Internal Medicine

## 2017-07-01 DIAGNOSIS — Z1239 Encounter for other screening for malignant neoplasm of breast: Secondary | ICD-10-CM

## 2017-07-20 ENCOUNTER — Ambulatory Visit (INDEPENDENT_AMBULATORY_CARE_PROVIDER_SITE_OTHER): Payer: Medicaid Other | Admitting: Neurology

## 2017-07-20 DIAGNOSIS — Z9989 Dependence on other enabling machines and devices: Secondary | ICD-10-CM

## 2017-07-20 DIAGNOSIS — G4733 Obstructive sleep apnea (adult) (pediatric): Secondary | ICD-10-CM | POA: Diagnosis not present

## 2017-07-20 DIAGNOSIS — G4720 Circadian rhythm sleep disorder, unspecified type: Secondary | ICD-10-CM

## 2017-07-21 ENCOUNTER — Telehealth: Payer: Self-pay | Admitting: Neurology

## 2017-07-21 NOTE — Telephone Encounter (Signed)
I called pt. I advised her that her sleep study showed that she does have moderate osa but her oxygen saturations were maintained above 90%. Her sleep apnea was almost all in the supine position and pt did have mild to moderate snoring. Since pt could not tolerate cpap in the past and she lost cpap coverage thru her insurance, Dr. Frances FurbishAthar recommends that for treatment that she avoid supine sleep and consider an oral appliance. Dr. Frances FurbishAthar recommends a follow up visit to discuss these options. Pt is agreeable to this. An appt was made for 09/24/17 at 11:30am. Pt verbalized understanding of results and appt date and time. Pt had no questions at this time but was encouraged to call back if questions arise.

## 2017-07-21 NOTE — Telephone Encounter (Signed)
Could not do result note:  Patient last seen by MM for OSA and no longer on CPAP. We did a baseline PSG for re-eval on 07/20/17.   Please call and notify the patient that the recent sleep demonstrates moderate obstructive sleep apnea by number of events, but oxygen saturations were maintained above 90%. Her sleep apnea was apparent almost exclusively in the supine position. She had mild to moderate snoring. Due to prior CPAP intolerance and losing CPAP coverage through her insurance due to suboptimal compliance, her treatment options are limited to avoidance of supine sleep position and consideration of an oral appliance. Please inform patient that I would like to go over the details of the study during a follow up appointment. Arrange a followup appointment.  Once you have spoken to patient, you can close this encounter.   Thanks,  Huston FoleySaima Tristine Langi, MD, PhD [facility])

## 2017-07-21 NOTE — Procedures (Signed)
PATIENT'S NAME:  Tina, [name] DOB:      07/03/1956      MR#:    [id number]     DATE OF RECORDING: 07/20/2017 REFERRING M.D.:  Meredith Pel, MD Study Performed:   Baseline Polysomnogram HISTORY: 61 year old female with a history of osteopenia, hyperlipidemia, type 2 diabetes, hypertension, reflux disease, arthritis, and status post left femoral neck fracture, status post hemiarthroplasty of left hip, COPD, smoking, and obstructive sleep apnea on CPAP. She returns for re-evaluation of her OSA. She no longer has a CPAP machine. The patient endorsed the Epworth Sleepiness Scale at 8 points. The patient's weight 135 pounds with a height of 66 (inches), resulting in a BMI of 21.6 kg/m2. The patient's neck circumference measured 14.25 inches.  CURRENT MEDICATIONS: Albuterol inhaler; Tenormin; Glucotrol XL; Hydrodiuril; Cozaar; Protonix   PROCEDURE:  This is a multichannel digital polysomnogram utilizing the Somnostar 11.2 system.  Electrodes and sensors were applied and monitored per AASM Specifications.   EEG, EOG, Chin and Limb EMG, were sampled at 200 Hz.  ECG, Snore and Nasal Pressure, Thermal Airflow, Respiratory Effort, CPAP Flow and Pressure, Oximetry was sampled at 50 Hz. Digital video and audio were recorded.      BASELINE STUDY  Lights Out was at 21:18 and Lights On at 05:14.  Total recording time (TRT) was 476.5 minutes, with a total sleep time (TST) of  261 minutes.  The patient's sleep latency was delayed. REM latency was 245.5 minutes, which is markedly delayed. The sleep efficiency was 54.8 %.     SLEEP ARCHITECTURE: WASO (Wake after sleep onset) was 174.5 minutes with a longer period of wakefulness. There were 11 minutes in Stage N1, 200 minutes Stage N2, 38.5 minutes Stage N3 and 11.5 minutes in Stage REM.  The percentage of Stage N1 was 4.2%, Stage N2 was 76.6%, which is increased, Stage N3 was 14.8%, which is normal, and Stage R (REM sleep) was 4.4%, which is markedly decreased.   The arousals were noted as: 28 were spontaneous, 3 were associated with PLMs, 82 were associated with respiratory events.    Audio and video analysis did not show any abnormal or unusual movements, behaviors, phonations or vocalizations. The patient took 3 bathroom breaks. Mild to moderate snoring was noted. The EKG was in keeping with normal sinus rhythm (NSR).  RESPIRATORY ANALYSIS:  There were a total of 82 respiratory events:  29 obstructive apneas, 0 central apneas and 0 mixed apneas with a total of 29 apneas and an apnea index (AI) of 6.7 /hour. There were 53 hypopneas with a hypopnea index of 12.2 /hour. The patient also had 0 respiratory event related arousals (RERAs).      The total APNEA/HYPOPNEA INDEX (AHI) was 18.9/hour and the total RESPIRATORY DISTURBANCE INDEX was 18.9 /hour.  4 events occurred in REM sleep and 98 events in NREM. The REM AHI was 20.9 /hour, versus a non-REM AHI of 18.8. The patient spent 210.5 minutes of total sleep time in the supine position and 51 minutes in non-supine.. The supine AHI was 23.4 versus a non-supine AHI of 0.0.  OXYGEN SATURATION & C02:  The Wake baseline 02 saturation was 99%, with the lowest being 91% (there were a few errors with the O2 sensor, including the nadir of 80% reported). Time spent below 89% saturation equaled 0 minutes.  PERIODIC LIMB MOVEMENTS: The patient had a total of 19 Periodic Limb Movements.  The Periodic Limb Movement (PLM) index was 4.4 and the PLM Arousal index  was .7/hour.  Post-study, the patient indicated that sleep was worse than usual.   IMPRESSION:  1. Obstructive Sleep Apnea (OSA) 2. Dysfunctions associated with sleep stages or arousal from sleep  RECOMMENDATIONS:  1. This study demonstrates moderate obstructive sleep apnea by number of events, but oxygen saturations were maintained above 90%. Her sleep apnea was apparent almost exclusively in the supine position. She had mild to moderate snoring. Due to prior  CPAP intolerance and losing CPAP coverage through her insurance due to suboptimal compliance, her treatment options are limited to avoidance of supine sleep position and consideration of an oral appliance.    2. Please note that untreated obstructive sleep apnea carries additional perioperative morbidity. Patients with significant obstructive sleep apnea should receive perioperative PAP therapy and the surgeons and particularly the anesthesiologist should be informed of the diagnosis and the severity of the sleep disordered breathing. 3. This study shows sleep fragmentation and abnormal sleep stage percentages; these are nonspecific findings and per se do not signify an intrinsic sleep disorder or a cause for the patient's sleep-related symptoms. Causes include (but are not limited to) the first night effect of the sleep study, circadian rhythm disturbances, medication effect or an underlying mood disorder or medical problem.  4. The patient should be cautioned not to drive, work at heights, or operate dangerous or heavy equipment when tired or sleepy. Review and reiteration of good sleep hygiene measures should be pursued with any patient. 5. The patient will be seen in follow-[facility] for discussion of the test results and further management strategies. The referring provider will be notified of the test results.  I certify that I have reviewed the entire raw data recording prior to the issuance of this report in accordance with the Standards of Accreditation of the American Academy of Sleep Medicine (AASM)   Huston FoleySaima Emer Onnen, MD, PhD Diplomat, American Board of Psychiatry and Neurology (Neurology and Sleep Medicine)

## 2017-07-30 ENCOUNTER — Encounter: Payer: Self-pay | Admitting: Gastroenterology

## 2017-07-30 ENCOUNTER — Ambulatory Visit: Payer: Medicaid Other | Admitting: Gastroenterology

## 2017-07-30 VITALS — BP 118/62 | HR 84 | Ht 65.35 in | Wt 133.2 lb

## 2017-07-30 DIAGNOSIS — R1013 Epigastric pain: Secondary | ICD-10-CM

## 2017-07-30 DIAGNOSIS — K2950 Unspecified chronic gastritis without bleeding: Secondary | ICD-10-CM | POA: Diagnosis not present

## 2017-07-30 DIAGNOSIS — R110 Nausea: Secondary | ICD-10-CM | POA: Diagnosis not present

## 2017-07-30 DIAGNOSIS — K3189 Other diseases of stomach and duodenum: Secondary | ICD-10-CM | POA: Diagnosis not present

## 2017-07-30 DIAGNOSIS — K862 Cyst of pancreas: Secondary | ICD-10-CM

## 2017-07-30 DIAGNOSIS — R1012 Left upper quadrant pain: Secondary | ICD-10-CM

## 2017-07-30 DIAGNOSIS — K31A0 Gastric intestinal metaplasia, unspecified: Secondary | ICD-10-CM

## 2017-07-30 DIAGNOSIS — R1084 Generalized abdominal pain: Secondary | ICD-10-CM | POA: Diagnosis not present

## 2017-07-30 MED ORDER — GABAPENTIN 300 MG PO CAPS: 300.0000 mg | ORAL_CAPSULE | Freq: Every day | ORAL | 1 refills | Status: DC

## 2017-07-30 MED ORDER — ONDANSETRON HCL 4 MG PO TABS: 4.0000 mg | ORAL_TABLET | Freq: Three times a day (TID) | ORAL | 1 refills | Status: DC | PRN

## 2017-07-30 MED ORDER — PANTOPRAZOLE SODIUM 20 MG PO TBEC: 20.0000 mg | DELAYED_RELEASE_TABLET | Freq: Two times a day (BID) | ORAL | 3 refills | Status: DC

## 2017-07-30 NOTE — Progress Notes (Signed)
HPI :  61 yo female with a histor yof chronic back pain, COPD, fibromyalgia, DM, GERD, here for a new patient referred by Dr. Ellison CarwinHannah Fuhr for ongoing LUQ pain, nausea, pancreatic cyst.   She endorses LUQ pain ongoing at least for the past year. She reports it is mostly in the LUQ but can radiate down into her LLQ, flank, and mid back. She reports having a spinal fusion surgery in October 2017 and this pain has been present sine that time. She also has associated numbness in her left extremities. Pain is present all the time, 24/7, never goes away. She states it feels like a pressure or "burning" sensation. Severity can fluctuate, often bothers her on a scale of 8/10. She takes tylenol which can help at times. She is not sure if eating can make it better or worse, hard to say. She thinks if she leans to her right, that can make it better.   She has occasional nausea at times and occasional heartburn. She denies any dysphagia. No RUQ pain. She does endorse chronic pain in the mid to lower back. She has been taking protonix dose at 20mg  q day for a long time. She was given oral carafate which has not helped her pain at all. She denies any weight loss. She denies any family history of pancreatic cancer, colon cancer, or gastric cancer.   She has had a [facility] as outlined below:  US - 05/05/17 - 1.3cm gallstone, 0.2cm polyp, normal exam otherwise CT scan abdomen - 05/27/17 - gallstones, 9mm pancreatic cyst in PD, 4mm cyst in tail, diffuse atherosclerosis  EGD 03/20/17 - mild esophagitis, "intense gastritis", normal duodenum - biopsies taken to rule out H pylori - path shows no H pylori, but findings include incidental finding of gastric intestinal metaplasia, normal esophageal biopsies Colonoscopy 03/19/17 - normal  Labs 04/10/2017  WBC 5.4, Hgb 13.4, Plt 182 Normal BMET / CMET   Past Medical History:  Diagnosis Date  . Arthritis    "joints; fingers; shoulders; knees; back"  (08/27/2012)  . Chronic back pain    epidural injections  . Chronic neck pain    myelopathy  . COPD (chronic obstructive pulmonary disease) (HCC)   . Fibromyalgia    "dx'd long time ago; before they really knew what it was" (112/02/2012)  . Gastric ulcer   . GERD (gastroesophageal reflux disease)    takes Protonix daily  . Heart murmur   . History of bronchitis several yrs ago  . History of colon polyps   . Hyperlipidemia    takes Lovastatin daily  . Hypertension    takes Losartan daily  . Joint swelling   . Osteoporosis   . Palpitations    takes Atenolol daily  . Pneumonia 2013   hx of  . Scoliosis   . Sleep apnea    "have a mask but I don't wear it" (08/27/2012)  . Type II diabetes mellitus (HCC)    takes Glipide daily  . Weakness    numbness in hands and feet     Past Surgical History:  Procedure Laterality Date  . CATARACT EXTRACTION, BILATERAL  2018  . COLONOSCOPY    . ESOPHAGOGASTRODUODENOSCOPY    . fatty tumor removed from right leg     as a teenager   Family History  Problem Relation Age of Onset  . Throat cancer Mother   . Stroke Sister   . Seizures Sister   . Dementia Sister   .  Lung cancer Brother   . Stroke Sister   . Prostate cancer Father   . Breast cancer Maternal Grandmother    Social History   Tobacco Use  . Smoking status: Current Every Day Smoker    Packs/day: 0.50    Years: 30.00    Pack years: 15.00    Types: Cigarettes  . Smokeless tobacco: Never Used  Substance Use Topics  . Alcohol use: No  . Drug use: No   Current Outpatient Medications  Medication Sig Dispense Refill  . acetaminophen (TYLENOL) 500 MG tablet Take 500 mg as needed by mouth.    Marland Kitchen. CARAFATE 1 GM/10ML suspension Take 10 mLs 2 (two) times daily by mouth.  3  . Cholecalciferol (VITAMIN D3) 1000 units CAPS Take 1,000 Units by mouth daily.    Marland Kitchen. glipiZIDE (GLUCOTROL XL) 10 MG 24 hr tablet Take 10 mg by mouth daily.    . hydrochlorothiazide (HYDRODIURIL) 25 MG tablet  Take 25 mg by mouth daily.    Marland Kitchen. losartan (COZAAR) 100 MG tablet Take 100 mg by mouth every morning.     . lovastatin (MEVACOR) 20 MG tablet Take 20 mg by mouth at bedtime.    . pantoprazole (PROTONIX) 20 MG tablet Take 20 mg by mouth every morning.      No current facility-administered medications for this visit.    Allergies  Allergen Reactions  . Hydrocodone-Acetaminophen Palpitations and Other (See Comments)    Other reaction(s): Dizziness  chest pain  . Tramadol Other (See Comments)    "It makes my chest feel funny; I don't like to take that"  . Cortizone-10 [Hydrocortisone]     Shots- Headaches   . Vicodin [Hydrocodone-Acetaminophen]   . Lisinopril Other (See Comments)    cough     Review of Systems: All systems reviewed and negative except where noted in HPI.   Lab Results  Component Value Date   WBC 6.8 02/26/2017   HGB 13.6 02/26/2017   HCT 41.6 02/26/2017   MCV 98.1 02/26/2017   PLT 205 02/26/2017    Lab Results  Component Value Date   CREATININE 1.09 (H) 02/26/2017   BUN 15 02/26/2017   NA 138 02/26/2017   K 4.9 02/26/2017   CL 107 02/26/2017   CO2 21 (L) 02/26/2017    Physical Exam: BP 118/62 (BP Location: Left Arm, Patient Position: Sitting, Cuff Size: Normal)   Pulse 84   Ht 5' 5.35" (1.66 m) Comment: height measured without shoes  Wt 133 lb 4 oz (60.4 kg)   BMI 21.93 kg/m  Constitutional: Pleasant,  female in no acute distress. HEENT: Normocephalic and atraumatic. Conjunctivae are normal. No scleral icterus. Neck supple.  Cardiovascular: Normal rate, regular rhythm.  Pulmonary/chest: Effort normal and breath sounds normal. No wheezing, rales or rhonchi. Abdominal: Soft, nondistended, LUQ TTP with mild positive Carnett sign. There are no masses palpable. No hepatomegaly. Extremities: no edema Lymphadenopathy: No cervical adenopathy noted. Neurological: Alert and oriented to person place and time. Skin: Skin is warm and dry. No rashes  noted. Psychiatric: Normal mood and affect. Behavior is normal.   ASSESSMENT AND PLAN: 61 year old female with history as outlined above, here for assessment of the following issues:  Abdominal pain -  based on her description of symptoms as outlined above, and negative workup to date otherwise,  I suspect she very likely musculoskeletal /neuropathic pain driving this issue. I don't think her gallstones are causing this. She is tender to the touch with (+) Carnett sign,  pain is present constantly,  radiates to her back where she has chronic low back pain. I discussed abdominal wall pain with her at length, and suspect this is related to her back pain. Recommend a trial of topical capsaicin cream applied twice daily, and will start Gabapentin 300mg  qHS, titrate up as tolerated. If pain persists despite this regimen, may consider pain management evaluation for trigger point injection. She agreed with the plan  Nausea / gastritis / dyspepsia / gastric intestinal metaplasia - she had gastritis noted on low dose protonix, don't think it is causing her symptoms of pain, but will try increasing protonix to BID to see if this helps, and gave her zofran to use PRN for severe nausea.  She had GIM noted on random biopsies of the stomach, but she has no risk factors for gastric cancer, and don't think she warrants surveillance for this issue. Can stop carafate, has not helped at all.   Pancreatic cysts - patient is very concerned about this and risk for malignancy. I counseled her I do not think these are driving her symptoms. I offered her an MRI / MRCP to better evaluate the cysts and advised if they appear benign without high risk features will plan on further surveillance exams. She is requesting open MRI due to claustrophobia. Will let her know results.   Ileene Patrick, MD [facility] Pager [phone]  CC: Ellison Carwin, MD

## 2017-07-30 NOTE — Patient Instructions (Signed)
You have been scheduled for an [facility] at [facility] at [address]  At 4:50pm on 08-12-17. Please arrive at 4:30pm for registration purposes. Please make certain not to have anything to eat or drink 6 hours prior to your test. In addition, if you have any metal in your body, have a pacemaker or defibrillator, please be sure to let your ordering physician know. This test typically takes 45 minutes to 1 hour to complete. Should you need to reschedule, please call [facility] at [phone] to do so.  Please purchase the following medications over the counter and take as directed: Capsaicin cream: May use topically twice a day  We have sent the following medications to your pharmacy for you to pick up at your convenience: Protonix (increase use to twice day) Zofran Gabapentin  Please discontinue using Carafate.  If you are age 61 or older, your body mass index should be between 23-30. Your Body mass index is 21.93 kg/m. If this is out of the aforementioned range listed, please consider follow up with your Primary Care Provider.  If you are age 61 or younger, your body mass index should be between 19-25. Your Body mass index is 21.93 kg/m. If this is out of the aformentioned range listed, please consider follow up with your Primary Care Provider.   Thank you.

## 2017-08-12 ENCOUNTER — Other Ambulatory Visit: Payer: Self-pay

## 2017-08-17 ENCOUNTER — Ambulatory Visit
Admission: RE | Admit: 2017-08-17 | Discharge: 2017-08-17 | Disposition: A | Discharge status: Home / Self Care | Payer: Medicaid Other | Source: Ambulatory Visit | Attending: Internal Medicine | Admitting: Internal Medicine

## 2017-08-17 DIAGNOSIS — Z1239 Encounter for other screening for malignant neoplasm of breast: Secondary | ICD-10-CM

## 2017-08-18 ENCOUNTER — Ambulatory Visit
Admission: RE | Admit: 2017-08-18 | Discharge: 2017-08-18 | Disposition: A | Discharge status: Home / Self Care | Payer: Medicaid Other | Source: Ambulatory Visit | Attending: Gastroenterology | Admitting: Gastroenterology

## 2017-08-18 DIAGNOSIS — K862 Cyst of pancreas: Secondary | ICD-10-CM

## 2017-08-18 MED ORDER — GADOBENATE DIMEGLUMINE 529 MG/ML IV SOLN: 12.0000 mL | Freq: Once | INTRAVENOUS | Status: DC | PRN

## 2017-08-19 ENCOUNTER — Telehealth: Payer: Self-pay | Admitting: Gastroenterology

## 2017-08-19 NOTE — Telephone Encounter (Signed)
Mailed copy of MR report to patient.

## 2017-09-24 ENCOUNTER — Ambulatory Visit: Payer: Self-pay | Admitting: Neurology

## 2017-09-24 ENCOUNTER — Encounter: Payer: Self-pay | Admitting: Neurology

## 2017-09-24 ENCOUNTER — Ambulatory Visit: Payer: Medicaid Other | Admitting: Neurology

## 2017-09-24 VITALS — BP 134/72 | HR 87 | Ht 65.0 in | Wt 134.0 lb

## 2017-09-24 DIAGNOSIS — G4733 Obstructive sleep apnea (adult) (pediatric): Secondary | ICD-10-CM

## 2017-09-24 DIAGNOSIS — Z789 Other specified health status: Secondary | ICD-10-CM

## 2017-09-24 DIAGNOSIS — R634 Abnormal weight loss: Secondary | ICD-10-CM | POA: Diagnosis not present

## 2017-09-24 NOTE — Progress Notes (Signed)
Subjective:    Patient ID: Tina [name] is a 62 y.o. female.  HPI     Interim history:   Tina [name] is a 62 year old right-handed woman with an underlying medical history of osteopenia, hyperlipidemia, type 2 diabetes, hypertension, reflux disease, arthritis, status post left femoral neck fracture, status post hemiarthroplasty of left hip, COPD, smoking, and overweight state, who presents for follow-up consultation of her obstructive sleep apnea, after recent sleep study testing. The patient is unaccompanied today. I last saw her on 12/15/2016, at which time she was not fully compliant with her AutoPap. She had neck surgery in October 2018. She reported having sleep disturbance in terms of nocturia, sometimes she would forget putting the mask on. She had an interim follow-up appointment with Ward Givens in September 2019, at which time she reported that she lost coverage for the PAP machine because she was not fully compliant. She no longer had her AutoPap machine. I recommended we bring her back for a repeat sleep study. She had a baseline sleep study on 07/20/2017. I went over her test results with her. She had a sleep efficiency of only 54.8%, sleep latency was delayed, REM latency was also markedly delayed. She had an increased percentage of stage II sleep, slow-wave sleep was 14.8% and REM sleep was markedly reduced at 4.4%. Total AHI was 18.9 per hour, REM AHI 20.9 per hour, supine AHI 23.4 per hour. Average oxygen saturation was 99%, nadir was 91%. She had no significant PLMS.   Today, 09/24/2017: She reports having lost quite a bit of weight of over 30 pounds. Her maximum weight was in the 160s, current weight is 134. She had neck surgery and had trouble swallowing after that. She also had abdominal issues and had abdominal pain after eating. She is now on gabapentin at night. She has a chronic issue with sleep onset and sleep maintenance. She tends to sleep on her sides at home. She  would like to consider an oral appliance if possible. We compared her sleep study findings from October 2018 with her sleep study that we did in August 2018.   The patient's allergies, current medications, family history, past medical history, past social history, past surgical history and problem list were reviewed and updated as appropriate.    Previously (copied from previous notes for reference):    I saw her on 06/12/2016, at which time we talked about her sleep study results from 05/21/2016. I suggested we proceed with AutoPap trial at home. She reported left shoulder pain. She was scheduled for cervical spine surgery on 06/25/2016.    I reviewed her AutoPap compliance data from 11/11/2016 through 12/10/2016, which is a total of 30 days, during which time she used her AutoPap 29 days with percent used days greater than 4 hours at 47%, indicating suboptimal compliance with an average usage of 4 hours and 17 minutes, residual AHI 0.4 per hour, 95th percentile of pressure of 9.5 cm, leak on the higher side with the 95th percentile at 23.5 L/m, range of 4-12 cm with EPR.    I first met her on 05/08/2016 at the request of her primary care provider, at which time she reported a prior diagnosis of obstructive sleep apnea and CPAP therapy for years but no recent use of CPAP and a history of recent difficult to control hypertension. I invited her back for sleep study. She had a baseline sleep study on 05/21/2016. Sleep efficiency was markedly reduced at 29.3% with long latency to  sleep of 210.5 minutes and wake after sleep onset of 96 minutes. She had an increased percentage of stage II sleep, absence of slow-wave sleep and REM sleep at 31.9% with a REM latency of 57 minutes. She had no significant PLMs, EKG or EEG changes. She had intermittent snoring. Total AHI was 6.6 per hour, rising to 19.3 per hour during REM sleep. Average oxygen saturation was 93%, nadir was 87%.    05/08/2016: She was diagnosed  with obstructive sleep apneas several years ago and placed on CPAP therapy. Her machine is several years old and she has not used it in years. She has had difficult to control hypertension. Prior sleep study results are not available for my review today. I reviewed your office note from 12/20/2015. She does snore, per children, sometimes wakes herself up with gasping and snoring. Her Epworth sleepiness score is 10 out of 24 today, her fatigue score is 51 out of 63 today. She does not sleep well through the night. She has nocturia about twice per night. She goes to bed between 11 and 11:30 PM and wakeup time is around 7:30. She does not wake up rested. She does not typically take a scheduled nap but does dose off sometimes for a few minutes.  She has occasional morning headaches. She has neck pain. She has been seeing orthopedics for this. She is going to have injections into her neck next week.  She would be willing to get retested for sleep apnea and she would be willing to try CPAP again. She uses the CPAP only for a few weeks or a few months. She lives alone. She is single, has 2 grown children, is currently not employed. She worked for [employer] in [place].  She does not drink caffeine daily, she smokes half a pack per day, she does not drink alcohol. She denies restless leg symptoms or leg twitching at night. Compared to her sleep study several years ago she believes she has lost weight  Her Past Medical History Is Significant For: Past Medical History:  Diagnosis Date  . Arthritis    "joints; fingers; shoulders; knees; back" (08/27/2012)  . Chronic back pain    epidural injections  . Chronic neck pain    myelopathy  . COPD (chronic obstructive pulmonary disease) ([place])   . Fibromyalgia    "dx'd long time ago; before they really knew what it was" (112/02/2012)  . Gastric ulcer   . GERD (gastroesophageal reflux disease)    takes Protonix daily  . Heart murmur   . History of  bronchitis several yrs ago  . History of colon polyps   . Hyperlipidemia    takes Lovastatin daily  . Hypertension    takes Losartan daily  . Joint swelling   . Osteoporosis   . Palpitations    takes Atenolol daily  . Pneumonia 2013   hx of  . Scoliosis   . Sleep apnea    "have a mask but I don't wear it" (08/27/2012)  . Type II diabetes mellitus (HCC)    takes Glipide daily  . Weakness    numbness in hands and feet    Her Past Surgical History Is Significant For: Past Surgical History:  Procedure Laterality Date  . ANTERIOR CERVICAL DECOMP/DISCECTOMY FUSION N/A 07/16/2016   Procedure: ANTERIOR CERVICAL DECOMPRESSION FUSION CERVICAL 3-4, CERVICAL 4-5, CERVICAL 5-6 WITH INSTRUMENATION AND ALLOGRAFT;  Surgeon: Phylliss Bob, MD;  Location: [facility];  Service: Orthopedics;  Laterality: N/A;  ANTERIOR  CERVICAL DECOMPRESSION FUSION CERVICAL 3-4, CERVICAL 4-5, CERVICAL 5-6 WITH INSTRUMENATION AND ALLOGRAFT  . CATARACT EXTRACTION, BILATERAL  2018  . COLONOSCOPY    . ESOPHAGOGASTRODUODENOSCOPY    . fatty tumor removed from right leg     as a teenager  . HIP ARTHROPLASTY  08/27/2012   Procedure: ARTHROPLASTY BIPOLAR HIP;  Surgeon: Hessie Dibble, MD;  Location: [facility];  Service: Orthopedics;  Laterality: Left;    Her Family History Is Significant For: Family History  Problem Relation Age of Onset  . Throat cancer Mother   . Stroke Sister   . Seizures Sister   . Dementia Sister   . Lung cancer Brother   . Stroke Sister   . Prostate cancer Father   . Breast cancer Maternal Grandmother     Her Social History Is Significant For: Social History   Socioeconomic History  . Marital status: Divorced    Spouse name: None  . Number of children: 2  . Years of education: None  . Highest education level: None  Social Needs  . Financial resource strain: None  . Food insecurity - worry: None  . Food insecurity - inability: None  . Transportation needs - medical: None  .  Transportation needs - non-medical: None  Occupational History  . None  Tobacco Use  . Smoking status: Current Every Day Smoker    Packs/day: 0.50    Years: 30.00    Pack years: 15.00    Types: Cigarettes  . Smokeless tobacco: Never Used  Substance and Sexual Activity  . Alcohol use: No  . Drug use: No  . Sexual activity: No  Other Topics Concern  . None  Social History Narrative  . None    Her Allergies Are:  Allergies  Allergen Reactions  . Hydrocodone-Acetaminophen Palpitations and Other (See Comments)    Other reaction(s): Dizziness  chest pain  . Tramadol Other (See Comments)    "It makes my chest feel funny; I don't like to take that"  . Cortizone-10 [Hydrocortisone]     Shots- Headaches   . Vicodin [Hydrocodone-Acetaminophen]   . Lisinopril Other (See Comments)    cough  :   Her Current Medications Are:  Outpatient Encounter Medications as of 09/24/2017  Medication Sig  . acetaminophen (TYLENOL) 500 MG tablet Take 500 mg as needed by mouth.  . Cholecalciferol (VITAMIN D3) 1000 units CAPS Take 1,000 Units by mouth daily.  Marland Kitchen gabapentin (NEURONTIN) 300 MG capsule Take 1 capsule (300 mg total) at bedtime by mouth. For 2 weeks, then take 600 mg (2 tablets) at night  . glipiZIDE (GLUCOTROL XL) 10 MG 24 hr tablet Take 10 mg by mouth daily.  . hydrochlorothiazide (HYDRODIURIL) 25 MG tablet Take 25 mg by mouth daily.  Marland Kitchen losartan (COZAAR) 100 MG tablet Take 100 mg by mouth every morning.   . lovastatin (MEVACOR) 20 MG tablet Take 20 mg by mouth at bedtime.  . pantoprazole (PROTONIX) 20 MG tablet Take 1 tablet (20 mg total) 2 (two) times daily by mouth.  . [DISCONTINUED] ondansetron (ZOFRAN) 4 MG tablet Take 1 tablet (4 mg total) every 8 (eight) hours as needed by mouth for nausea or vomiting.   No facility-administered encounter medications on file as of 09/24/2017.   :  Review of Systems:  Out of a complete 14 point review of systems, all are reviewed and negative with  the exception of these symptoms as listed below: Review of Systems  Neurological:  Pt presents today to discuss her sleep study results.    Objective:  Neurological Exam  Physical Exam Physical Examination:   Vitals:   09/24/17 0754  BP: 134/72  Pulse: 87    General Examination: The patient is a very pleasant 62 y.o. female in no acute distress. She appears well-developed and well-nourished and well groomed.   HEENT: Normocephalic, atraumatic, pupils are equal, round and reactive to light and accommodation. Extraocular tracking is good without limitation to gaze excursion or nystagmus noted. Normal smooth pursuit is noted. Hearing is grossly intact. Face is symmetric with normal facial animation and normal facial sensation. Speech is clear with no dysarthria noted. There is no hypophonia. There is no lip, neck/head, jaw or voice tremor. Neck is supple with Mild decrease in range of motion side to side. She has a well-healed anterior cervical scar from her neck surgery. Airway examination reveals mild mouth dryness, adequate dental hygiene, moderate airway crowding. Tongue protrudes centrally and palate elevates symmetrically. Tonsils are 2+ in size.   Chest: Clear to auscultation without wheezing, rhonchi or crackles noted.  Heart: S1+S2+0, regular and normal without murmurs, rubs or gallops noted.   Abdomen: Soft, non-tender and non-distended with normal bowel sounds appreciated on auscultation.  Extremities: There is no pitting edema in the distal lower extremities bilaterally. Pedal pulses are intact.  Skin: Warm and dry without trophic changes noted.  Musculoskeletal: exam reveals no obvious joint deformities, tenderness or joint swelling or erythema. Decreased range of motion left shoulder and tenderness reported with movement.   Neurologically:  Mental status: The patient is awake, alert and oriented in all 4 spheres. Her immediate and remote memory, attention,  language skills and fund of knowledge are appropriate. There is no evidence of aphasia, agnosia, apraxia or anomia. Speech is clear with normal prosody and enunciation. Thought process is linear. Mood is normal and affect is normal.  Cranial nerves II - XII are as described above under HEENT exam. In addition: shoulder shrug is normal with equal shoulder height noted. Motor exam: Normal bulk, strength and tone is noted. There is no drift, tremor or rebound. Romberg is negative. Reflexes are 1-2+ throughout. Fine motor skills and coordination: intact.  Cerebellar testing: No dysmetria or intention tremor.   Sensory exam: intact to light touch in the upper and lower extremities.  Gait, station and balance: She stands easily. No veering to one side is noted. No leaning to one side is noted. Posture is age-appropriate and stance is narrow based. Gait shows normal stride length and normal pace. No problems turning are noted. Tandem walk is unremarkable.    Assessment and Plan:  In summary, Tina [name] is a very pleasant 62 year old female with an underlying medical history of osteopenia, hyperlipidemia, type 2 diabetes, hypertension, reflux disease, arthritis, status post left femoral neck fracture, status post hemiarthroplasty of left hip, COPD, and smoking, who presents for follow-up consultation of her obstructive sleep apnea, after recent repeat PSG testing. She had an AutoPAP machine but lost coverage for the machine d/t non-compliance/intolerance. She had a baseline sleep study on 05/21/2016 which showed mild to moderate OSA, decreased sleep efficiency, with total sleep time of just a little over 2 hours. O2 nadir was 87% at the time. She has since then lost weight and her recent PSG showed moderate OSA by number of events, but no significant desaturations. She would like to consider an oral appliance if possible. I made a referral to dentistry. Alternatively, she is advised  to keep her weight  stable and sleep off her back at night. She is furthermore advised to quit smoking altogether. She is working on it. At this point, she is advised to follow-up with me as needed. For sleep onset difficulties she can try melatonin. She is advised to keep in mind that also the gabapentin can make her sleepy at night. I answered all her questions today and she was in agreement.  I spent 25 minutes in total face-to-face time with the patient, more than 50% of which was spent in counseling and coordination of care, reviewing test results, reviewing medication and discussing or reviewing the diagnosis of OSA, its prognosis and treatment options. Pertinent laboratory and imaging test results that were available during this visit with the patient were reviewed by me and considered in my medical decision making (see chart for details).

## 2017-09-24 NOTE — Patient Instructions (Addendum)
Your most recent sleep shows less severe sleep apnea, compared to your study from 04/2016. This is likely due to your weight loss of over 20 lb.   I would recommend, you sleep on your sides, as opposed to your back at home. Please quit smoking altogether.  You may consider a so called oral appliance for snoring and your sleep apnea, if you want to seek additional treatment. You could talk to your own dentist, not every dentist treats sleep apnea and makes this appliances. I can also make a referral to a local dentist practice in town, if needed.   I will see you back at this point on an as-needed basis.    You can try Melatonin at night for sleep: take 1 mg to 3 mg, one to 2 hours before your bedtime. You can go up to 5 mg if needed. It is over the counter and comes in pill form, chewable form and spray, if you prefer.    Keep in mind, your gabapentin may make you sleepy as well.

## 2018-06-11 ENCOUNTER — Other Ambulatory Visit: Payer: Self-pay | Admitting: Physician Assistant

## 2018-06-11 DIAGNOSIS — Z1231 Encounter for screening mammogram for malignant neoplasm of breast: Secondary | ICD-10-CM

## 2018-07-28 NOTE — Progress Notes (Signed)
Patient ID: Tina [name], female   DOB: 25-Apr-1956, 62 y.o.   MRN: [id number]          Reason for Appointment: Goiter, new consultation    History of Present Illness:   Patient has been referred by Thomasene Mohair, PA-C  The patient's thyroid abnormality was first discovered in 03/2018 She apparently was having a routine exam and screening TSH was low This was repeated and was low again at 0.22  She has not been told that she has any swelling in her thyroid on exams Also she does not complain of any recent symptoms of weight loss or palpitations However in 2017 after her neck surgery she had lost over 15 pounds which has been maintained She occasionally feels her heart racing but this is not new Only occasionally may feel a little shaky in her hands No heat intolerance, rarely may feel hot temporarily She does not take any herbal supplements OTC  She has had no difficulty with swallowing no symptoms of choking or pressure in her neck   No results found for: FREET4, TSH    Allergies as of 07/29/2018      Reactions   Hydrocodone-acetaminophen Palpitations, Other (See Comments)   Other reaction(s): Dizziness  chest pain   Tramadol Other (See Comments)   "It makes my chest feel funny; I don't like to take that"   Cortizone-10 [hydrocortisone]    Shots- Headaches   Vicodin [hydrocodone-acetaminophen]    Lisinopril Other (See Comments)   cough      Medication List        Accurate as of 07/29/18 11:16 AM. Always use your most recent med list.          lovastatin 20 MG tablet Commonly known as:  MEVACOR Take 20 mg by mouth at bedtime.   pantoprazole 20 MG tablet Commonly known as:  PROTONIX Take 1 tablet (20 mg total) 2 (two) times daily by mouth.   TYLENOL 500 MG tablet Generic drug:  acetaminophen Take 500 mg as needed by mouth.   valsartan 160 MG tablet Commonly known as:  DIOVAN Take 160 mg by mouth daily. for high blood pressure   Vitamin D3 25 MCG  (1000 UT) Caps Take 1,000 Units by mouth daily.       Allergies:  Allergies  Allergen Reactions  . Hydrocodone-Acetaminophen Palpitations and Other (See Comments)    Other reaction(s): Dizziness  chest pain  . Tramadol Other (See Comments)    "It makes my chest feel funny; I don't like to take that"  . Cortizone-10 [Hydrocortisone]     Shots- Headaches   . Vicodin [Hydrocodone-Acetaminophen]   . Lisinopril Other (See Comments)    cough    Past Medical History:  Diagnosis Date  . Arthritis    "joints; fingers; shoulders; knees; back" (08/27/2012)  . Chronic back pain    epidural injections  . Chronic neck pain    myelopathy  . COPD (chronic obstructive pulmonary disease) (HCC)   . Fibromyalgia    "dx'd long time ago; before they really knew what it was" (112/02/2012)  . Gastric ulcer   . GERD (gastroesophageal reflux disease)    takes Protonix daily  . Heart murmur   . History of bronchitis several yrs ago  . History of colon polyps   . Hyperlipidemia    takes Lovastatin daily  . Hypertension    takes Losartan daily  . Joint swelling   . Osteoporosis   . Palpitations  takes Atenolol daily  . Pneumonia 2013   hx of  . Scoliosis   . Sleep apnea    "have a mask but I don't wear it" (08/27/2012)  . Type II diabetes mellitus (HCC)    takes Glipide daily  . Weakness    numbness in hands and feet    Past Surgical History:  Procedure Laterality Date  . ANTERIOR CERVICAL DECOMP/DISCECTOMY FUSION N/A 07/16/2016   Procedure: ANTERIOR CERVICAL DECOMPRESSION FUSION CERVICAL 3-4, CERVICAL 4-5, CERVICAL 5-6 WITH INSTRUMENATION AND ALLOGRAFT;  Surgeon: Estill Bamberg, MD;  Location: [facility] OR;  Service: Orthopedics;  Laterality: N/A;  ANTERIOR CERVICAL DECOMPRESSION FUSION CERVICAL 3-4, CERVICAL 4-5, CERVICAL 5-6 WITH INSTRUMENATION AND ALLOGRAFT  . CATARACT EXTRACTION, BILATERAL  2018  . COLONOSCOPY    . ESOPHAGOGASTRODUODENOSCOPY    . fatty tumor removed from right leg      as a teenager  . HIP ARTHROPLASTY  08/27/2012   Procedure: ARTHROPLASTY BIPOLAR HIP;  Surgeon: Velna Ochs, MD;  Location: [facility] OR;  Service: Orthopedics;  Laterality: Left;    Family History  Problem Relation Age of Onset  . Throat cancer Mother   . Stroke Sister   . Seizures Sister   . Dementia Sister   . Lung cancer Brother   . Stroke Sister   . Prostate cancer Father   . Breast cancer Maternal Grandmother     Social History:  reports that she has been smoking cigarettes. She has a 15.00 pack-year smoking history. She has never used smokeless tobacco. She reports that she does not drink alcohol or use drugs.   Review of Systems  Constitutional: Negative for weight loss and diaphoresis.  Respiratory: Negative for shortness of breath.   Cardiovascular: Positive for palpitations.  Gastrointestinal: Negative for diarrhea.  Endocrine: Negative for fatigue and heat intolerance.       Rare hot flashes  Musculoskeletal: Negative for joint pain.  Skin: Negative for rash.  Neurological: Positive for numbness. Negative for weakness.       She has some persistent numbness in her hands and feet even after her neck surgery      Examination:   BP 132/72   Pulse 94   Ht 5\' 6"  (1.676 m)   Wt 136 lb (61.7 kg)   SpO2 99%   BMI 21.95 kg/m    General Appearance: pleasant, averagely built and nourished        Eyes: No prominence or swelling.           Neck: The thyroid is enlarged on the right side.  This is soft to firm and about twice normal without any distinct nodules No local tenderness Left side is not palpable  There is no lymphadenopathy.     Cardiovascular: Normal  heart sounds, no murmur Respiratory:  Lungs clear Abdomen shows no hepatosplenomegaly or tenderness Neurological: REFLEXES: at biceps are normal.  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

## 2018-07-29 ENCOUNTER — Encounter: Payer: Self-pay | Admitting: Endocrinology

## 2018-07-29 ENCOUNTER — Ambulatory Visit (INDEPENDENT_AMBULATORY_CARE_PROVIDER_SITE_OTHER): Payer: Medicaid Other | Admitting: Endocrinology

## 2018-07-29 VITALS — BP 132/72 | HR 94 | Ht 66.0 in | Wt 136.0 lb

## 2018-07-29 DIAGNOSIS — E049 Nontoxic goiter, unspecified: Secondary | ICD-10-CM

## 2018-07-29 LAB — TSH: TSH: 0.34 u[IU]/mL — AB (ref 0.35–4.50)

## 2018-07-29 LAB — T3, FREE: T3 FREE: 3 pg/mL (ref 2.3–4.2)

## 2018-07-29 LAB — T4, FREE: FREE T4: 0.95 ng/dL (ref 0.60–1.60)

## 2018-07-30 ENCOUNTER — Other Ambulatory Visit (INDEPENDENT_AMBULATORY_CARE_PROVIDER_SITE_OTHER): Payer: Medicaid Other

## 2018-07-30 ENCOUNTER — Ambulatory Visit: Payer: Medicaid Other | Admitting: Gastroenterology

## 2018-07-30 ENCOUNTER — Encounter: Payer: Self-pay | Admitting: Gastroenterology

## 2018-07-30 VITALS — BP 138/62 | HR 84 | Ht 65.34 in | Wt 137.0 lb

## 2018-07-30 DIAGNOSIS — K219 Gastro-esophageal reflux disease without esophagitis: Secondary | ICD-10-CM

## 2018-07-30 DIAGNOSIS — K862 Cyst of pancreas: Secondary | ICD-10-CM

## 2018-07-30 DIAGNOSIS — R1012 Left upper quadrant pain: Secondary | ICD-10-CM

## 2018-07-30 LAB — BASIC METABOLIC PANEL
BUN: 18 mg/dL (ref 6–23)
CALCIUM: 9.5 mg/dL (ref 8.4–10.5)
CHLORIDE: 107 meq/L (ref 96–112)
CO2: 28 mEq/L (ref 19–32)
CREATININE: 1.29 mg/dL — AB (ref 0.40–1.20)
GFR: 53.86 mL/min — ABNORMAL LOW (ref >60.00)
Glucose, Bld: 80 mg/dL (ref 70–99)
Potassium: 4.3 mEq/L (ref 3.5–5.1)
Sodium: 140 mEq/L (ref 135–145)

## 2018-07-30 MED ORDER — GABAPENTIN 300 MG PO CAPS: ORAL_CAPSULE | ORAL | 1 refills | Status: DC

## 2018-07-30 NOTE — Patient Instructions (Signed)
You have been scheduled for an MRI/MRCP at [facility] on 08/09/2018. Your appointment time is 10am. Please arrive 15 minutes prior to your appointment time for registration purposes. Please make certain not to have anything to eat or drink 6 hours prior to your test. In addition, if you have any metal in your body, have a pacemaker or defibrillator, please be sure to let your ordering physician know. This test typically takes 45 minutes to 1 hour to complete. Should you need to reschedule, please call [phone] to do so.  Go to the basement for labs today  We will send Gabapentin to your pharmacy   If you are age 53 or older, your body mass index should be between 23-30. Your Body mass index is 22.56 kg/m. If this is out of the aforementioned range listed, please consider follow up with your Primary Care Provider.  If you are age 32 or younger, your body mass index should be between 19-25. Your Body mass index is 22.56 kg/m. If this is out of the aformentioned range listed, please consider follow up with your Primary Care Provider.

## 2018-07-30 NOTE — Progress Notes (Signed)
HPI :  62 y/o female here for a follow up visit. Since her last visit she had an MRCP done as outlined for evaluation of pancreatic cyst:  MRCP done 04/17/17 - 1cm cystic lesion at junction of pancreatic head and tail - recommended repeat in one year, gallstones  She has also continued to have LUQ pain ongoing at least for the 2 years, since her surgery.. She reports it is mostly in the LUQ but can radiate down into her LLQ, flank, and mid back. She reports having a spinal fusion surgery in October 2017 and this pain has been present since that time. She also has associated numbness in her left extremities. Pain is present all the time, 24/7, never goes away. She states it feels like a pressure or "cramping" sensation. She is not sure if there is any prandial relationship. I had recommend topical capsaicin cream and gabapentin at the last visit. She took the gabapentin and she thought it helped some but stopped it, unclear if dose was ever escalated beyond 300mg . She otherwise has a history of GERD but reports no significant regurgitation or pyrosis, symptoms generally controlled.   She has had a prior extensive workup by [facility] as outlined below:  [name] - 05/05/17 - 1.3cm gallstone, 0.2cm polyp, normal exam otherwise CT scan abdomen - 05/27/17 - gallstones, 9mm pancreatic cyst in PD, 4mm cyst in tail, diffuse atherosclerosis  EGD 03/20/17 - mild esophagitis, gastritis, normal duodenum - biopsies taken to rule out H pylori - path shows no H pylori, but findings include incidental finding of gastric intestinal metaplasia, normal esophageal biopsies Colonoscopy 03/19/17 - normal     Past Medical History:  Diagnosis Date  . Arthritis    "joints; fingers; shoulders; knees; back" (08/27/2012)  . Chronic back pain    epidural injections  . Chronic neck pain    myelopathy  . COPD (chronic obstructive pulmonary disease) (HCC)   . Fibromyalgia    "dx'd long time ago; before they really knew what it  was" (112/02/2012)  . Gastric ulcer   . GERD (gastroesophageal reflux disease)    takes Protonix daily  . Heart murmur   . History of bronchitis several yrs ago  . History of colon polyps   . Hyperlipidemia    takes Lovastatin daily  . Hypertension    takes Losartan daily  . Joint swelling   . Osteoporosis   . Palpitations    takes Atenolol daily  . Pneumonia 2013   hx of  . Prediabetes   . Scoliosis   . Sleep apnea    "have a mask but I don't wear it" (08/27/2012)  . Weakness    numbness in hands and feet     Past Surgical History:  Procedure Laterality Date  . ANTERIOR CERVICAL DECOMP/DISCECTOMY FUSION N/A 07/16/2016   Procedure: ANTERIOR CERVICAL DECOMPRESSION FUSION CERVICAL 3-4, CERVICAL 4-5, CERVICAL 5-6 WITH INSTRUMENATION AND ALLOGRAFT;  Surgeon: Estill Bamberg, MD;  Location: [facility] OR;  Service: Orthopedics;  Laterality: N/A;  ANTERIOR CERVICAL DECOMPRESSION FUSION CERVICAL 3-4, CERVICAL 4-5, CERVICAL 5-6 WITH INSTRUMENATION AND ALLOGRAFT  . CATARACT EXTRACTION, BILATERAL  2018  . COLONOSCOPY    . ESOPHAGOGASTRODUODENOSCOPY    . fatty tumor removed from right leg     as a teenager  . HIP ARTHROPLASTY  08/27/2012   Procedure: ARTHROPLASTY BIPOLAR HIP;  Surgeon: Velna Ochs, MD;  Location: [facility] OR;  Service: Orthopedics;  Laterality: Left;   Family History  Problem Relation Age  of Onset  . Throat cancer Mother   . Stroke Sister   . Seizures Sister   . Dementia Sister   . Lung cancer Brother   . Stroke Sister   . Prostate cancer Father   . Hypertension Father   . Breast cancer Maternal Grandmother   . Diabetes Other   . Thyroid disease Neg Hx    Social History   Tobacco Use  . Smoking status: Current Every Day Smoker    Packs/day: 0.50    Years: 30.00    Pack years: 15.00    Types: Cigarettes  . Smokeless tobacco: Never Used  Substance Use Topics  . Alcohol use: No  . Drug use: No   Current Outpatient Medications  Medication Sig Dispense Refill  .  acetaminophen (TYLENOL) 500 MG tablet Take 500 mg as needed by mouth.    . Cholecalciferol (VITAMIN D3) 1000 units CAPS Take 1,000 Units by mouth daily.    Marland Kitchen lovastatin (MEVACOR) 20 MG tablet Take 20 mg by mouth at bedtime.    . pantoprazole (PROTONIX) 20 MG tablet Take 1 tablet (20 mg total) 2 (two) times daily by mouth. 60 tablet 3  . valsartan (DIOVAN) 160 MG tablet Take 160 mg by mouth daily. for high blood pressure  1   No current facility-administered medications for this visit.    Allergies  Allergen Reactions  . Hydrocodone-Acetaminophen Palpitations and Other (See Comments)    Other reaction(s): Dizziness  chest pain  . Tramadol Other (See Comments)    "It makes my chest feel funny; I don't like to take that"  . Cortizone-10 [Hydrocortisone]     Shots- Headaches   . Vicodin [Hydrocodone-Acetaminophen]   . Lisinopril Other (See Comments)    cough     Review of Systems: All systems reviewed and negative except where noted in HPI.   Lab Results  Component Value Date   WBC 6.8 02/26/2017   HGB 13.6 02/26/2017   HCT 41.6 02/26/2017   MCV 98.1 02/26/2017   PLT 205 02/26/2017    Lab Results  Component Value Date   CREATININE 1.29 (H) 07/30/2018   BUN 18 07/30/2018   NA 140 07/30/2018   K 4.3 07/30/2018   CL 107 07/30/2018   CO2 28 07/30/2018     Physical Exam: BP 138/62   Pulse 84   Ht 5' 5.34" (1.66 m)   Wt 137 lb (62.1 kg)   BMI 22.56 kg/m  Constitutional: Pleasant,well-developed, female in no acute distress. HEENT: Normocephalic and atraumatic. Conjunctivae are normal. No scleral icterus. Neck supple.  Cardiovascular: Normal rate, regular rhythm.  Pulmonary/chest: Effort normal and breath sounds normal. No wheezing, rales or rhonchi. Abdominal: Soft, nondistended, TTP along left costal margin and left flank with positive Carnett. There are no masses palpable. No hepatomegaly. Extremities: no edema Lymphadenopathy: No cervical adenopathy  noted. Neurological: Alert and oriented to person place and time. Skin: Skin is warm and dry. No rashes noted. Psychiatric: Normal mood and affect. Behavior is normal.   ASSESSMENT AND PLAN: 62 year old female here for reassessment following issues:  Left upper quadrant pain - extensive evaluation as outlined above. Based off her description of symptoms, exam findings, negative imaging otherwise, I suspect she very likely has musculoskeletal/neuropathic pain. Previously had some benefit with gabapentin but stopped it. Discussed options with her. Recommend she resume gabapentin 300 mg once daily and increased to twice daily as tolerated, she can increase even further moving forward if she tolerates it and provides  benefit, she will call me if going above 600 mg. May consider pain management consultation moving forward.  Pancreatic cyst - discussed pancreatic cyst with her MRCP findings. This is very likely a benign lesion however radiology recommending a one-year follow-up exam. We will order MRCP to reassess this, consider EUS if any concerning changes noted. Otherwise we'll make sure nothing is now on this exam to account for her pain.  GERD - continue Protonix dosing as needed for control this symptom.  Ileene Patrick, MD [facility]

## 2018-08-02 ENCOUNTER — Other Ambulatory Visit: Payer: Self-pay

## 2018-08-02 ENCOUNTER — Other Ambulatory Visit: Payer: Self-pay | Admitting: Endocrinology

## 2018-08-02 DIAGNOSIS — R799 Abnormal finding of blood chemistry, unspecified: Secondary | ICD-10-CM

## 2018-08-02 DIAGNOSIS — R7989 Other specified abnormal findings of blood chemistry: Secondary | ICD-10-CM

## 2018-08-02 DIAGNOSIS — E041 Nontoxic single thyroid nodule: Secondary | ICD-10-CM

## 2018-08-02 NOTE — Progress Notes (Signed)
Notified Tina [name] --for thyroid results with Dr. Lucianne Muss instructions.Tina [name] requesting to ge t the scan done in [place] instead of [employer]

## 2018-08-03 ENCOUNTER — Other Ambulatory Visit: Payer: Self-pay

## 2018-08-03 ENCOUNTER — Telehealth: Payer: Self-pay | Admitting: Gastroenterology

## 2018-08-03 DIAGNOSIS — R9389 Abnormal findings on diagnostic imaging of other specified body structures: Secondary | ICD-10-CM

## 2018-08-03 DIAGNOSIS — R1012 Left upper quadrant pain: Secondary | ICD-10-CM

## 2018-08-03 DIAGNOSIS — K869 Disease of pancreas, unspecified: Secondary | ICD-10-CM

## 2018-08-03 NOTE — Addendum Note (Signed)
Addended by: Marlowe KaysSTALLINGS, Monserrate Blaschke M on: 08/03/2018 04:11 PM   Modules accepted: Orders

## 2018-08-03 NOTE — Telephone Encounter (Signed)
Routed to Robin, scheduled during clinic visit.

## 2018-08-03 NOTE — Telephone Encounter (Signed)
Cancelled MRI MRCP at [facility] as patient requested and put in new order for MRCP for [facility]   They will contact patient to schedule   Patient aware they will call her

## 2018-08-06 ENCOUNTER — Telehealth: Payer: Self-pay | Admitting: Endocrinology

## 2018-08-06 NOTE — Telephone Encounter (Signed)
Pt have appt Nov. 25@ 12:30 pm--[facility] thyroid scan [facility] and needed precert please....Lestine Boxthankyou

## 2018-08-06 NOTE — Telephone Encounter (Signed)
Patient called re: Patient requests to be called at ph#[phone] for status of thyroid test scheduling

## 2018-08-09 ENCOUNTER — Ambulatory Visit (HOSPITAL_COMMUNITY): Payer: Medicaid Other

## 2018-08-10 ENCOUNTER — Telehealth: Payer: Self-pay | Admitting: Endocrinology

## 2018-08-10 NOTE — Telephone Encounter (Signed)
Patient wants to know the address to the appointment you Misty StanleyLisa called her about. Please Advise, thanks.

## 2018-08-10 NOTE — Telephone Encounter (Signed)
Tina HeckleJacqueline [name] is calling regarding a referral for her Goiter. Pt states has called before and is waiting for a call back

## 2018-08-10 NOTE — Telephone Encounter (Signed)
Spoke with patient and gave her directions to [facility]

## 2018-08-10 NOTE — Telephone Encounter (Signed)
LVM making patient aware that she is scheduled [facility]/16/[facility] Thyroid [facility]- I requested a call back if she has any further questions

## 2018-08-13 ENCOUNTER — Telehealth: Payer: Self-pay | Admitting: Gastroenterology

## 2018-08-13 ENCOUNTER — Telehealth: Payer: Self-pay | Admitting: Endocrinology

## 2018-08-13 ENCOUNTER — Other Ambulatory Visit: Payer: Self-pay

## 2018-08-13 NOTE — Telephone Encounter (Signed)
Patient stated that she needs a fax sent over stating that she has an appointment at Monday to have test done. Patient stated Dr Lucianne MussKumar is the one sending her over to the hospital to have these done and If these are not faxed she said she could not have the test done  Please advise  Fax # [phone] Attn: Mr Iran PlanasSoto

## 2018-08-13 NOTE — Telephone Encounter (Signed)
Faxed transportation note as requested.

## 2018-08-13 NOTE — Telephone Encounter (Signed)
Pt needed an appointment verification note faxed to transportation, this has been completed.

## 2018-08-13 NOTE — Telephone Encounter (Signed)
Can you please fax this for this patient

## 2018-08-13 NOTE — Telephone Encounter (Signed)
error 

## 2018-08-16 ENCOUNTER — Ambulatory Visit (HOSPITAL_COMMUNITY): Payer: Medicaid Other

## 2018-08-18 ENCOUNTER — Other Ambulatory Visit (INDEPENDENT_AMBULATORY_CARE_PROVIDER_SITE_OTHER): Payer: Medicaid Other

## 2018-08-18 ENCOUNTER — Ambulatory Visit
Admission: RE | Admit: 2018-08-18 | Discharge: 2018-08-18 | Disposition: A | Discharge status: Home / Self Care | Payer: Medicaid Other | Source: Ambulatory Visit | Attending: Physician Assistant | Admitting: Physician Assistant

## 2018-08-18 DIAGNOSIS — R7989 Other specified abnormal findings of blood chemistry: Secondary | ICD-10-CM | POA: Diagnosis not present

## 2018-08-18 DIAGNOSIS — R799 Abnormal finding of blood chemistry, unspecified: Secondary | ICD-10-CM | POA: Diagnosis not present

## 2018-08-18 DIAGNOSIS — Z1231 Encounter for screening mammogram for malignant neoplasm of breast: Secondary | ICD-10-CM

## 2018-08-18 LAB — BASIC METABOLIC PANEL
BUN: 15 mg/dL (ref 6–23)
CALCIUM: 9.5 mg/dL (ref 8.4–10.5)
CO2: 28 meq/L (ref 19–32)
CREATININE: 0.95 mg/dL (ref 0.40–1.20)
Chloride: 104 mEq/L (ref 96–112)
GFR: 76.65 mL/min (ref >60.00)
Glucose, Bld: 99 mg/dL (ref 70–99)
Potassium: 3.8 mEq/L (ref 3.5–5.1)
Sodium: 137 mEq/L (ref 135–145)

## 2018-08-24 ENCOUNTER — Other Ambulatory Visit: Payer: Self-pay

## 2018-08-27 ENCOUNTER — Encounter (HOSPITAL_COMMUNITY)
Admission: RE | Admit: 2018-08-27 | Discharge: 2018-08-27 | Disposition: A | Discharge status: Home / Self Care | Payer: Medicaid Other | Source: Ambulatory Visit | Attending: Endocrinology | Admitting: Endocrinology

## 2018-08-27 DIAGNOSIS — E041 Nontoxic single thyroid nodule: Secondary | ICD-10-CM

## 2018-08-27 DIAGNOSIS — R7989 Other specified abnormal findings of blood chemistry: Secondary | ICD-10-CM | POA: Diagnosis present

## 2018-08-27 MED ORDER — SODIUM PERTECHNETATE TC 99M INJECTION
10.0000 | Freq: Once | INTRAVENOUS | Status: AC | PRN
  Administered 2018-08-27: 10 via INTRAVENOUS

## 2018-09-24 ENCOUNTER — Telehealth: Payer: Self-pay | Admitting: Endocrinology

## 2018-09-24 NOTE — Telephone Encounter (Signed)
He has a goiter the cause of which is unknown, due to a benign increased cell growth and some overactive cells

## 2018-09-24 NOTE — Telephone Encounter (Signed)
Per [facility] "Caller states received a call from the Dr's office in regards to her thyroid test. Test came back fine but for to ask why it was swollen at the time."

## 2018-09-24 NOTE — Telephone Encounter (Signed)
Pt verbalized understanding that the test results were fine, but wants to know why her thyroid is swollen.

## 2018-09-24 NOTE — Telephone Encounter (Signed)
Patient stated she would like a call back to ask about her thyroid. She said she spoke with someone yesterday but forgot to ask why her thyroid was swollen during her test.   Please advise

## 2018-09-27 NOTE — Telephone Encounter (Signed)
Pt called and given Md message.

## 2018-09-27 NOTE — Telephone Encounter (Signed)
Attempted to call pt and inform her of MD message. Pt did not answer.

## 2018-09-29 ENCOUNTER — Telehealth: Payer: Self-pay | Admitting: Gastroenterology

## 2018-10-01 NOTE — Telephone Encounter (Signed)
This is the first I have heard of it. She has a pancreatic cyst and MRCP is the best way to evaluate it, she is due for surveillance of it now. Could you help re-order this, and I am happy to do a peer to peer to try and get this approved if needed. Thanks

## 2018-10-01 NOTE — Telephone Encounter (Signed)
I spoke with Mike Gip PA [place].  She will work on [employer] again and let me know on Monday

## 2018-10-01 NOTE — Telephone Encounter (Signed)
Dr. Adela Lank the patient's insurance apparently did not approve MRCP that was ordered in November.  Do you have an alternative scan?

## 2018-10-04 ENCOUNTER — Telehealth: Payer: Self-pay | Admitting: Gastroenterology

## 2018-10-04 NOTE — Telephone Encounter (Signed)
Patient wanted to have her MRI at the same location as her last one.  She is advised that that was [facility].  She will call them back and schedule.

## 2018-10-04 NOTE — Telephone Encounter (Signed)
Patient is scheduled for 10/14/18 at [facility]

## 2018-10-04 NOTE — Telephone Encounter (Signed)
Tina [name], Amy L sent to Annett Fabian, RN        Hey [name],  Got auth# [id number] extended to 10/23/18. She will need to get study done before that. Otherwise they will have to start a new case, and the previous one was denied and reconsidered after a peer to peer.  Thanks,  Amy      I spoke with Pieter Partridge at [facility], she will contact the patient to have them reschedule MRI.  I left a message for the patient that she should hear from [facility].

## 2018-10-12 ENCOUNTER — Telehealth: Payer: Self-pay | Admitting: Gastroenterology

## 2018-10-12 NOTE — Telephone Encounter (Signed)
Pt called needing transportation from city [place] and needing a letter faxed over to [facility] @ [phone] verifying that she has an appt sched for 10/14/2018 12:30pm

## 2018-10-12 NOTE — Telephone Encounter (Signed)
Patient has a MRI at [address] on 10/14/18 at 12:30.  She will need to arrive at 12:15.  If you have any questions, please call [phone].

## 2018-10-14 ENCOUNTER — Ambulatory Visit
Admission: RE | Admit: 2018-10-14 | Discharge: 2018-10-14 | Disposition: A | Discharge status: Home / Self Care | Payer: Medicaid Other | Source: Ambulatory Visit | Attending: Gastroenterology | Admitting: Gastroenterology

## 2018-10-14 DIAGNOSIS — K869 Disease of pancreas, unspecified: Secondary | ICD-10-CM

## 2018-10-14 DIAGNOSIS — R9389 Abnormal findings on diagnostic imaging of other specified body structures: Secondary | ICD-10-CM

## 2018-10-14 DIAGNOSIS — R1012 Left upper quadrant pain: Secondary | ICD-10-CM

## 2018-10-14 MED ORDER — GADOBENATE DIMEGLUMINE 529 MG/ML IV SOLN
12.0000 mL | Freq: Once | INTRAVENOUS | Status: AC | PRN
  Administered 2018-10-14: 12 mL via INTRAVENOUS

## 2018-10-27 ENCOUNTER — Telehealth: Payer: Self-pay | Admitting: Endocrinology

## 2018-10-27 NOTE — Telephone Encounter (Signed)
Results mailed to patient;.

## 2018-10-27 NOTE — Telephone Encounter (Signed)
Patient called re: patient requests to receive a copy of the thyroid report she had done at [facility]. Please mail the above mentioned results to patient, per patient's request.

## 2018-11-01 ENCOUNTER — Telehealth: Payer: Self-pay | Admitting: Gastroenterology

## 2018-11-01 NOTE — Telephone Encounter (Signed)
Requests a copy of the imaging be mailed to her for her records. Confirmed the address. Mailed MRI of 10/14/18

## 2018-11-30 ENCOUNTER — Telehealth: Payer: Self-pay | Admitting: Endocrinology

## 2018-11-30 NOTE — Telephone Encounter (Signed)
Dr. Lucianne Muss has filled out this paperwork accordingly, without giving any patient sensitive information. MD stated that patient was seen for a follow up and paperwork was signed.

## 2018-11-30 NOTE — Telephone Encounter (Signed)
Patient has called stating [name] from transportation has called and confirmed with someone in our office about faxing [name] a form to fill out to confirm the patients appointment. There is nothing noted in regards to this and we stated to the patient our office normal does not do this that transportation usually confirms over the phone with [name].  Patient will be contacting jonathan to refax documents.

## 2018-12-02 ENCOUNTER — Other Ambulatory Visit: Payer: Self-pay

## 2018-12-02 ENCOUNTER — Ambulatory Visit (INDEPENDENT_AMBULATORY_CARE_PROVIDER_SITE_OTHER): Payer: Medicaid Other | Admitting: Endocrinology

## 2018-12-02 ENCOUNTER — Encounter: Payer: Self-pay | Admitting: Endocrinology

## 2018-12-02 VITALS — BP 162/72 | HR 89 | Ht 65.34 in | Wt 136.8 lb

## 2018-12-02 DIAGNOSIS — E041 Nontoxic single thyroid nodule: Secondary | ICD-10-CM

## 2018-12-02 DIAGNOSIS — E049 Nontoxic goiter, unspecified: Secondary | ICD-10-CM | POA: Diagnosis not present

## 2018-12-02 LAB — TSH: TSH: 0.42 u[IU]/mL (ref 0.35–4.50)

## 2018-12-02 LAB — T3, FREE: T3, Free: 3.5 pg/mL (ref 2.3–4.2)

## 2018-12-02 LAB — T4, FREE: FREE T4: 0.93 ng/dL (ref 0.60–1.60)

## 2018-12-02 NOTE — Progress Notes (Signed)
Please call to let patient know that the lab results are normal and no further action needed

## 2018-12-02 NOTE — Progress Notes (Signed)
Patient ID: Tina [name], female   DOB: Nov 17, 1955, 63 y.o.   MRN: [id number]          Reason for Appointment: Goiter, follow-up    History of Present Illness:    The patient's thyroid abnormality was first discovered in 03/2018 She apparently was having a routine exam and screening TSH was low This was repeated and was low again at 0.22 by PCP  On her initial consultation in 11/19 since TSH was only borderline at 0.34 no treatment was done She was found to have a right-sided thyroid enlargement Subsequently her thyroid scan was performed This did not show any abnormal uptake pattern in the thyroid  Currently she does not complain of any recent symptoms of weight loss She occasionally feels her heart racing but this is not new No shakiness or heat intolerance  No family history of thyroid disease again   Lab Results  Component Value Date   FREET4 0.95 07/29/2018   TSH 0.34 (L) 07/29/2018      Allergies as of 12/02/2018      Reactions   Hydrocodone-acetaminophen Palpitations, Other (See Comments)   Other reaction(s): Dizziness  chest pain   Tramadol Other (See Comments)   "It makes my chest feel funny; I don't like to take that"   Cortizone-10 [hydrocortisone]    Shots- Headaches   Vicodin [hydrocodone-acetaminophen]    Lisinopril Other (See Comments)   cough      Medication List       Accurate as of December 02, 2018  1:19 PM. Always use your most recent med list.        gabapentin 300 MG capsule Commonly known as:  Neurontin Take one capsule once daily then increase to 2 capsules twice daily as tolerated   lovastatin 20 MG tablet Commonly known as:  MEVACOR Take 20 mg by mouth at bedtime.   pantoprazole 20 MG tablet Commonly known as:  PROTONIX Take 1 tablet (20 mg total) 2 (two) times daily by mouth.   TYLENOL 500 MG tablet Generic drug:  acetaminophen Take 500 mg as needed by mouth.   valsartan 160 MG tablet Commonly known as:  DIOVAN Take  160 mg by mouth daily. for high blood pressure       Allergies:  Allergies  Allergen Reactions  . Hydrocodone-Acetaminophen Palpitations and Other (See Comments)    Other reaction(s): Dizziness  chest pain  . Tramadol Other (See Comments)    "It makes my chest feel funny; I don't like to take that"  . Cortizone-10 [Hydrocortisone]     Shots- Headaches   . Vicodin [Hydrocodone-Acetaminophen]   . Lisinopril Other (See Comments)    cough    Past Medical History:  Diagnosis Date  . Arthritis    "joints; fingers; shoulders; knees; back" (08/27/2012)  . Chronic back pain    epidural injections  . Chronic neck pain    myelopathy  . COPD (chronic obstructive pulmonary disease) (HCC)   . Fibromyalgia    "dx'd long time ago; before they really knew what it was" (112/02/2012)  . Gastric ulcer   . GERD (gastroesophageal reflux disease)    takes Protonix daily  . Heart murmur   . History of bronchitis several yrs ago  . History of colon polyps   . Hyperlipidemia    takes Lovastatin daily  . Hypertension    takes Losartan daily  . Joint swelling   . Osteoporosis   . Palpitations    takes Atenolol daily  .  Pneumonia 2013   hx of  . Prediabetes   . Scoliosis   . Sleep apnea    "have a mask but I don't wear it" (08/27/2012)  . Weakness    numbness in hands and feet    Past Surgical History:  Procedure Laterality Date  . ANTERIOR CERVICAL DECOMP/DISCECTOMY FUSION N/A 07/16/2016   Procedure: ANTERIOR CERVICAL DECOMPRESSION FUSION CERVICAL 3-4, CERVICAL 4-5, CERVICAL 5-6 WITH INSTRUMENATION AND ALLOGRAFT;  Surgeon: Estill Bamberg, MD;  Location: [facility] OR;  Service: Orthopedics;  Laterality: N/A;  ANTERIOR CERVICAL DECOMPRESSION FUSION CERVICAL 3-4, CERVICAL 4-5, CERVICAL 5-6 WITH INSTRUMENATION AND ALLOGRAFT  . CATARACT EXTRACTION, BILATERAL  2018  . COLONOSCOPY    . ESOPHAGOGASTRODUODENOSCOPY    . fatty tumor removed from right leg     as a teenager  . HIP ARTHROPLASTY   08/27/2012   Procedure: ARTHROPLASTY BIPOLAR HIP;  Surgeon: Velna Ochs, MD;  Location: [facility] OR;  Service: Orthopedics;  Laterality: Left;    Family History  Problem Relation Age of Onset  . Throat cancer Mother   . Stroke Sister   . Seizures Sister   . Dementia Sister   . Lung cancer Brother   . Stroke Sister   . Prostate cancer Father   . Hypertension Father   . Breast cancer Maternal Grandmother   . Diabetes Other   . Thyroid disease Neg Hx     Social History:  reports that she has been smoking cigarettes. She has a 15.00 pack-year smoking history. She has never used smokeless tobacco. She reports that she does not drink alcohol or use drugs.   Review of Systems    Examination:   BP (!) 162/72 (BP Location: Left Arm, Patient Position: Sitting, Cuff Size: Normal)   Pulse 89   Ht 5' 5.34" (1.66 m)   Wt 136 lb 12.8 oz (62.1 kg)   SpO2 98%   BMI 22.53 kg/m   She looks well The thyroid is enlarged only on the right side, about twice normal and smooth, slightly firm Left lobe is not palpable There is no lymphadenopathy.     Biceps reflexes appear normal   Assessment/Plan:  Right-sided goiter, previously with mild suppression of TSH without hyperthyroidism She does not have any hot or cold nodules on her thyroid scan  Thyroid levels will be checked again and if normal she can be followed annually Reassured her that the small unilateral goiter is of no significance because of lack of hot or cold nodules    Reather Littler 12/02/2018  Addendum: Thyroid levels are normal, she will follow-up in 1 year  Office Visit on 12/02/2018  Component Date Value Ref Range Status  . T3, Free 12/02/2018 3.5  2.3 - 4.2 pg/mL Final  . Free T4 12/02/2018 0.93  0.60 - 1.60 ng/dL Final   Comment: Specimens from patients who are undergoing biotin therapy and /or ingesting biotin supplements may contain high levels of biotin.  The higher biotin concentration in these specimens interferes  with this Free T4 assay.  Specimens that contain high levels  of biotin may cause false high results for this Free T4 assay.  Please interpret results in light of the total clinical presentation of the patient.    Marland Kitchen TSH 12/02/2018 0.42  0.35 - 4.50 uIU/mL Final

## 2019-06-17 ENCOUNTER — Other Ambulatory Visit: Payer: Self-pay | Admitting: Physician Assistant

## 2019-06-17 DIAGNOSIS — Z1231 Encounter for screening mammogram for malignant neoplasm of breast: Secondary | ICD-10-CM

## 2019-08-05 ENCOUNTER — Encounter: Payer: Self-pay | Admitting: Gastroenterology

## 2019-08-05 ENCOUNTER — Encounter: Payer: Self-pay | Admitting: Neurology

## 2019-08-05 ENCOUNTER — Other Ambulatory Visit (INDEPENDENT_AMBULATORY_CARE_PROVIDER_SITE_OTHER): Payer: Medicaid Other

## 2019-08-05 ENCOUNTER — Ambulatory Visit: Payer: Medicaid Other | Admitting: Gastroenterology

## 2019-08-05 VITALS — BP 122/64 | HR 93 | Temp 97.8°F | Ht 65.0 in | Wt 136.2 lb

## 2019-08-05 DIAGNOSIS — K862 Cyst of pancreas: Secondary | ICD-10-CM

## 2019-08-05 DIAGNOSIS — R202 Paresthesia of skin: Secondary | ICD-10-CM

## 2019-08-05 DIAGNOSIS — K219 Gastro-esophageal reflux disease without esophagitis: Secondary | ICD-10-CM | POA: Diagnosis not present

## 2019-08-05 DIAGNOSIS — R1012 Left upper quadrant pain: Secondary | ICD-10-CM

## 2019-08-05 DIAGNOSIS — R200 Anesthesia of skin: Secondary | ICD-10-CM

## 2019-08-05 DIAGNOSIS — R350 Frequency of micturition: Secondary | ICD-10-CM | POA: Diagnosis not present

## 2019-08-05 LAB — URINALYSIS, ROUTINE W REFLEX MICROSCOPIC
Bilirubin Urine: NEGATIVE
Hgb urine dipstick: NEGATIVE
Ketones, ur: NEGATIVE
Leukocytes,Ua: NEGATIVE
Nitrite: NEGATIVE
Specific Gravity, Urine: <=1.005 — AB (ref 1.000–1.030)
Total Protein, Urine: NEGATIVE
Urine Glucose: NEGATIVE
Urobilinogen, UA: 0.2 (ref 0.0–1.0)
pH: 6 (ref 5.0–8.0)

## 2019-08-05 MED ORDER — FAMOTIDINE 20 MG PO TABS: 20.0000 mg | ORAL_TABLET | Freq: Two times a day (BID) | ORAL | 1 refills | Status: DC

## 2019-08-05 NOTE — Progress Notes (Signed)
HPI :  63 y/o female here for a follow up visit.  She is followed in our clinic in the past for pancreatic cysts and abdominal pain.  She has had a 1 cm pancreatic cyst noted dating back to 2018.  She had an MRCP done 04/17/17 which showed 1cm cystic lesion at junction of pancreatic head and tail, and gallstones.  She had a follow-up MRCP performed in January 2020 showing a stable 1 cm pancreatic cyst, radiology is recommending a 1 year follow-up MRI to ensure stability.  She generally feels the same since of last seen her.  She does have some chronic left upper quadrant pain which has been going on for a few years now since her neck surgery.  This can radiate down into her flank and back.  She also endorses feeling numbness in her fingers and her toes, more so on the left side.  Its been present since 2017. Pain is present all the time, 24/7, never goes away.  There is no relation of this pain to eating.  I previously had given a trial of gabapentin at 300 mg, she never increased the dose and it was not unclear if this helped at all.  She otherwise endorses feeling increased need to urinate with urgency.  Denies any dysuria.  No fevers.  She states is going on for some time.  Denies any family history of pancreatic cancer or gastric cancer.  No weight loss.  She takes Protonix 20 mg a day for reflux.  She states this definitely helps reduce her symptoms however she still is having some breakthrough at times, which she states is mostly dietary related.  She then states however that she feels breakthrough on most days of the week.  She is hesitant to increase the dose of Protonix due to potential risks which we discussed.  She asked about other options.  She has had a prior extensive workup by [facility] as outlined below:  [name] - 05/05/17 - 1.3cm gallstone, 0.2cm polyp, normal exam otherwise CT scan abdomen - 05/27/17 - gallstones, 89mm pancreatic cyst in PD, 25mm cyst in tail, diffuse atherosclerosis  EGD  03/20/17 - mild esophagitis, gastritis, normal duodenum - biopsies taken to rule out H pylori - path shows no H pylori,but findings include incidental finding ofgastric intestinal metaplasia, normal esophageal biopsies Colonoscopy 03/19/17 - normal  MRCP 10/14/18 -  IMPRESSION: Stable 10 mm simple cystic lesion in the pancreatic body. Recommend continued follow-up by MRI in 1 year. This recommendation follows ACR consensus guidelines: Management of Incidental Pancreatic Cysts: A White Paper of the ACR Incidental Findings Committee. J Am Coll Radiol 2017;14:911-923.  Cholelithiasis.  No radiographic evidence of cholecystitis.   Past Medical History:  Diagnosis Date  . Arthritis    "joints; fingers; shoulders; knees; back" (08/27/2012)  . Chronic back pain    epidural injections  . Chronic neck pain    myelopathy  . COPD (chronic obstructive pulmonary disease) (HCC)   . Fibromyalgia    "dx'd long time ago; before they really knew what it was" (112/02/2012)  . Gastric ulcer   . GERD (gastroesophageal reflux disease)    takes Protonix daily  . Heart murmur   . History of bronchitis several yrs ago  . History of colon polyps   . Hyperlipidemia    takes Lovastatin daily  . Hypertension    takes Losartan daily  . Joint swelling   . Osteoporosis   . Palpitations    takes Atenolol  daily  . Pneumonia 2013   hx of  . Prediabetes   . Scoliosis   . Sleep apnea    "have a mask but I don't wear it" (08/27/2012)  . Weakness    numbness in hands and feet     Past Surgical History:  Procedure Laterality Date  . ANTERIOR CERVICAL DECOMP/DISCECTOMY FUSION N/A 07/16/2016   Procedure: ANTERIOR CERVICAL DECOMPRESSION FUSION CERVICAL 3-4, CERVICAL 4-5, CERVICAL 5-6 WITH INSTRUMENATION AND ALLOGRAFT;  Surgeon: Estill BambergMark Dumonski, MD;  Location: [facility] OR;  Service: Orthopedics;  Laterality: N/A;  ANTERIOR CERVICAL DECOMPRESSION FUSION CERVICAL 3-4, CERVICAL 4-5, CERVICAL 5-6 WITH INSTRUMENATION AND  ALLOGRAFT  . CATARACT EXTRACTION, BILATERAL  2018  . COLONOSCOPY    . ESOPHAGOGASTRODUODENOSCOPY    . fatty tumor removed from right leg     as a teenager  . HIP ARTHROPLASTY  08/27/2012   Procedure: ARTHROPLASTY BIPOLAR HIP;  Surgeon: Velna OchsPeter G Dalldorf, MD;  Location: [facility] OR;  Service: Orthopedics;  Laterality: Left;   Family History  Problem Relation Age of Onset  . Throat cancer Mother   . Stroke Sister   . Seizures Sister   . Dementia Sister   . Lung cancer Brother   . Stroke Sister   . Prostate cancer Father   . Hypertension Father   . Breast cancer Maternal Grandmother   . Diabetes Other   . Thyroid disease Neg Hx    Social History   Tobacco Use  . Smoking status: Current Every Day Smoker    Packs/day: 0.50    Years: 30.00    Pack years: 15.00    Types: Cigarettes  . Smokeless tobacco: Never Used  Substance Use Topics  . Alcohol use: No  . Drug use: No   Current Outpatient Medications  Medication Sig Dispense Refill  . acetaminophen (TYLENOL) 500 MG tablet Take 500 mg as needed by mouth.    Marland Kitchen. amLODipine (NORVASC) 10 MG tablet Take 10 mg by mouth daily.    Marland Kitchen. lovastatin (MEVACOR) 20 MG tablet Take 20 mg by mouth at bedtime.    . pantoprazole (PROTONIX) 20 MG tablet Take 1 tablet (20 mg total) 2 (two) times daily by mouth. 60 tablet 3  . valsartan (DIOVAN) 160 MG tablet Take 160 mg by mouth daily. for high blood pressure  1   No current facility-administered medications for this visit.    Allergies  Allergen Reactions  . Hydrocodone-Acetaminophen Palpitations and Other (See Comments)    Other reaction(s): Dizziness  chest pain  . Tramadol Other (See Comments)    "It makes my chest feel funny; I don't like to take that"  . Cortizone-10 [Hydrocortisone]     Shots- Headaches   . Vicodin [Hydrocodone-Acetaminophen]   . Lisinopril Other (See Comments)    cough     Review of Systems: All systems reviewed and negative except where noted in HPI.     Physical  Exam: BP 122/64 (BP Location: Left Arm, Patient Position: Sitting, Cuff Size: Normal)   Pulse 93   Temp 97.8 F (36.6 C) (Oral)   Ht 5\' 5"  (1.651 m)   Wt 136 lb 4 oz (61.8 kg)   BMI 22.67 kg/m  Constitutional: Pleasant,well-developed, female in no acute distress. HEENT: Normocephalic and atraumatic. Conjunctivae are normal. No scleral icterus. Neck supple.  Cardiovascular: Normal rate, regular rhythm.  Pulmonary/chest: Effort normal and breath sounds normal. No wheezing, rales or rhonchi. Abdominal: Soft, nondistended, nontender.  There are no masses palpable. No hepatomegaly. Extremities: no  edema Lymphadenopathy: No cervical adenopathy noted. Neurological: Alert and oriented to person place and time. Skin: Skin is warm and dry. No rashes noted. Psychiatric: Normal mood and affect. Behavior is normal.   ASSESSMENT AND PLAN: 63 year old female here for reassessment of the following:  Pancreatic cyst - we reviewed her MRCP findings.  This is very likely a benign lesion, reviewed guidelines for surveillance with her.  Given its size at 1 cm, recommending a follow-up MRCP 1 year from the last exam, thus due January 2021.  If any enlargement or concerning changes are noted would consider an EUS.  I reassured her otherwise I do not think her symptoms are related to the cyst.  She specifically requests an open MRI due to claustrophobia, will coordinate this for January.  She agreed  Left upper quadrant pain - extensive evaluation, I suspect this is likely musculoskeletal/neuropathic.  Had previously given her gabapentin which she was not sure helped too much and stopped it.  I reassured her nothing else noted on imaging to cause this. As below given neuropathy, will refer her to neurology for further evaluation and discussion of options.  GERD - she will continue Protonix but given her breakthrough we will try Pepcid 20 mg once to twice daily as needed.  She wishes to try Pepcid rather than  escalating her Protonix dosing.  She can follow-up as needed for this issue if no improvement.  Neuropathy - persistent sensation of numbness in her hands and feet, left side more than right since her surgery.  She has not seen a neurologist for this and I offered her referral and she wanted to proceed with it.  I do not see any recent baseline labs on file in our system, will obtain her most recent lab work-up done by her primary care.  She agreed  Increased urinary frequency - ongoing issue, I did address that this should be evaluated by her primary care but I will send a urinalysis initially to evaluate per her request.  [place] Cellar, MD [facility]

## 2019-08-05 NOTE — Patient Instructions (Addendum)
If you are age 63 or older, your body mass index should be between 23-30. Your Body mass index is 22.67 kg/m. If this is out of the aforementioned range listed, please consider follow up with your Primary Care Provider.  If you are age 34 or younger, your body mass index should be between 19-25. Your Body mass index is 22.67 kg/m. If this is out of the aformentioned range listed, please consider follow up with your Primary Care Provider.    You will be due for an MRI in January 2021. [facility] will call you to reschedule your appt. Once scheduled, you can complete the following instructions:  You have been scheduled for an Open MRCP at [facility], located at [address] . Your appointment is scheduled for _______________ at ___________. Please arrive 15 minutes prior to your appointment time for registration purposes. Please make certain not to have anything to eat or drink 6 hours prior to your test. In addition, if you have any metal in your body, have a pacemaker or defibrillator, please be sure to let your ordering physician know. This test typically takes 45 minutes to 1 hour to complete. Should you need to reschedule, please call [phone] to do so.  We have sent the following medications to your pharmacy for you to pick up at your convenience: Pepcid 20mg : Take once to twice daily  Continue your Protonix.  We will refer you to [facility] for neuropathy.  We will request your labs from [facility] office.  Please go to the lab in the basement of our building to have lab work done (Urinalysis)  as you leave today. Hit "B" for basement when you get on the elevator.  When the doors open the lab is on your left.  We will call you with the results. Thank you.  Thank you for entrusting me with your care and for choosing [facility], Dr. [place]rence [place] Cellar

## 2019-08-10 ENCOUNTER — Telehealth: Payer: Self-pay | Admitting: Gastroenterology

## 2019-08-10 NOTE — Telephone Encounter (Signed)
Thank you for clarifying for her Tina [name], I appreciate it.

## 2019-08-10 NOTE — Telephone Encounter (Signed)
After review of the last office note, pt is to continue the Protonix daily and use Pepcid BID as needed for break thru symptoms vs increasing her Protonix. Patient has been notified of the recommendations given at her follow up. No further questions at this time.

## 2019-08-10 NOTE — Telephone Encounter (Signed)
Pt wants to know if Dr. Havery Moros wants her to take famotidine and pantoprazole. She states that she has been only taken pantoprazole until he prescribed famotidine so she is not sure if famotidine substitutes pantoprazole of if she needs to take both.

## 2019-08-22 ENCOUNTER — Other Ambulatory Visit: Payer: Self-pay

## 2019-08-22 ENCOUNTER — Ambulatory Visit
Admission: RE | Admit: 2019-08-22 | Discharge: 2019-08-22 | Disposition: A | Discharge status: Home / Self Care | Payer: Medicaid Other | Source: Ambulatory Visit | Attending: Physician Assistant | Admitting: Physician Assistant

## 2019-08-22 DIAGNOSIS — Z1231 Encounter for screening mammogram for malignant neoplasm of breast: Secondary | ICD-10-CM

## 2019-09-06 ENCOUNTER — Telehealth: Payer: Self-pay | Admitting: Gastroenterology

## 2019-09-06 NOTE — Telephone Encounter (Signed)
Labs arrived - done 06/21/19 - normal renal function, LFTs entirely normal, lipids normal. No CBC sent. She has been referred to Neurology for her neuropathy

## 2019-09-12 ENCOUNTER — Other Ambulatory Visit: Payer: Self-pay

## 2019-09-12 ENCOUNTER — Ambulatory Visit: Payer: Medicaid Other | Admitting: Neurology

## 2019-09-12 ENCOUNTER — Encounter: Payer: Self-pay | Admitting: Neurology

## 2019-09-12 VITALS — BP 156/71 | HR 91 | Ht 65.0 in | Wt 135.0 lb

## 2019-09-12 DIAGNOSIS — M5000 Cervical disc disorder with myelopathy, unspecified cervical region: Secondary | ICD-10-CM | POA: Diagnosis not present

## 2019-09-12 DIAGNOSIS — G959 Disease of spinal cord, unspecified: Secondary | ICD-10-CM

## 2019-09-12 MED ORDER — DULOXETINE HCL 30 MG PO CPEP: 30.0000 mg | ORAL_CAPSULE | Freq: Every day | ORAL | 2 refills | Status: DC

## 2019-09-12 NOTE — Patient Instructions (Addendum)
Start Cymbalta 30mg  daily  MRI cervical spine without contrast Once your imaging has been authorized by your insurance, then it can be scheduled.  If you do not hear anything, please call [place].  Return to clinic in 4 months

## 2019-09-12 NOTE — Progress Notes (Signed)
[facility] Neurology Division Clinic Note - Initial Visit   Date: 09/12/19  Tina [name] MRN: [id number] DOB: 09-08-1956   Dear Dr. Adela Lank:  Thank you for your kind referral of Tina [name] for consultation of numbness/tingling. Although her history is well known to you, please allow [name] to reiterate it for the purpose of our medical record. The patient was accompanied to the clinic by self.    History of Present Illness: Tina [name] is a 63 y.o. right-handed female with hypertension, GERD, tobacco use, and cervical myelopathy s/p ACDF from C3-C6  presenting for evaluation of numbness/tingling of the hands and feet.  Starting in 2017, she began having numbness over the fingers, hands, abdomen, and legs, worse on the left side.  She also has some weakness in the hands and imbalance.  She walks unassisted and has not had any falls.  She had MRI cervical spine in 2017 which showed severe cervical canal stenosis with multilevel impingement worse at C3-5 with associated myelomalacia and underwent ACDF at C3-4, C4-5, C5-6 by Dr. Yevette Edwards.  Following surgery, the intensity of her numbness and tingling significantly improved, but did not completely resolve.  She continues to have numbness over the hands and legs, which is worse on the left side.  She occasionally has tingling and pain in the hands, worse during the winter months.  She has tried gabapentin which did not provide relief.    She is on disability since 2010 for her back.  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    Out-side paper records, electronic medical record, and images have been reviewed where available and summarized as:  MRI cervical spine wo contrast 01/24/2014:  IMPRESSION: 1. Cervical spondylosis and degenerative disc disease, causing prominent impingement at C3-4 and C4-5 ; moderate to prominent impingement at C5-6 ; and mild impingement at C2-3, as detailed above. Equivocal  low-grade cord edema or gliosis at C3-4 and C4-5.  Lab Results  Component Value Date   HGBA1C 5.9 (H) 06/19/2016   No results found for: VITAMINB12 Lab Results  Component Value Date   TSH 0.42 12/02/2018   No results found for: ESRSEDRATE, POCTSEDRATE  Past Medical History:  Diagnosis Date  . Arthritis    "joints; fingers; shoulders; knees; back" (08/27/2012)  . Chronic back pain    epidural injections  . Chronic neck pain    myelopathy  . COPD (chronic obstructive pulmonary disease) (HCC)   . Fibromyalgia    "dx'd long time ago; before they really knew what it was" (112/02/2012)  . Gastric ulcer   . GERD (gastroesophageal reflux disease)    takes Protonix daily  . Heart murmur   . History of bronchitis several yrs ago  . History of colon polyps   . Hyperlipidemia    takes Lovastatin daily  . Hypertension    takes Losartan daily  . Joint swelling   . Osteoporosis   . Palpitations    takes Atenolol daily  . Pneumonia 2013   hx of  . Prediabetes   . Scoliosis   . Sleep apnea    "have a mask but I don't wear it" (08/27/2012)  . Weakness    numbness in hands and feet    Past Surgical History:  Procedure Laterality Date  . ANTERIOR CERVICAL DECOMP/DISCECTOMY FUSION N/A 07/16/2016   Procedure: ANTERIOR CERVICAL DECOMPRESSION FUSION CERVICAL 3-4, CERVICAL 4-5, CERVICAL 5-6 WITH INSTRUMENATION AND ALLOGRAFT;  Surgeon: Estill Bamberg, MD;  Location: [facility] OR;  Service: Orthopedics;  Laterality: N/A;  ANTERIOR CERVICAL DECOMPRESSION FUSION CERVICAL 3-4, CERVICAL 4-5, CERVICAL 5-6 WITH INSTRUMENATION AND ALLOGRAFT  . CATARACT EXTRACTION, BILATERAL  2018  . COLONOSCOPY    . ESOPHAGOGASTRODUODENOSCOPY    . fatty tumor removed from right leg     as a teenager  . HIP ARTHROPLASTY  08/27/2012   Procedure: ARTHROPLASTY BIPOLAR HIP;  Surgeon: Velna OchsPeter G Dalldorf, MD;  Location: [facility] OR;  Service: Orthopedics;  Laterality: Left;     Medications:  Outpatient Encounter Medications as of  09/12/2019  Medication Sig  . acetaminophen (TYLENOL) 500 MG tablet Take 500 mg as needed by mouth.  Marland Kitchen. amLODipine (NORVASC) 10 MG tablet Take 10 mg by mouth daily.  . famotidine (PEPCID) 20 MG tablet as needed. Take one tablet by mouth once to twice daily  . lovastatin (MEVACOR) 20 MG tablet Take 20 mg by mouth at bedtime.  . pantoprazole (PROTONIX) 20 MG tablet Take 20 mg by mouth daily.  . valsartan (DIOVAN) 160 MG tablet Take 160 mg by mouth daily. for high blood pressure  . [DISCONTINUED] pantoprazole (PROTONIX) 20 MG tablet Take 1 tablet (20 mg total) 2 (two) times daily by mouth. (Patient taking differently: Take 20 mg by mouth as needed. )  . DULoxetine (CYMBALTA) 30 MG capsule Take 1 capsule (30 mg total) by mouth daily.   No facility-administered encounter medications on file as of 09/12/2019.    Allergies:  Allergies  Allergen Reactions  . Hydrocodone-Acetaminophen Palpitations and Other (See Comments)    Other reaction(s): Dizziness  chest pain  . Tramadol Other (See Comments)    "It makes my chest feel funny; I don't like to take that"  . Cortizone-10 [Hydrocortisone]     Shots- Headaches   . Vicodin [Hydrocodone-Acetaminophen]   . Lisinopril Other (See Comments)    cough    Family History: Family History  Problem Relation Age of Onset  . Throat cancer Mother   . Stroke Sister   . Seizures Sister   . Dementia Sister   . Lung cancer Brother   . Stroke Sister   . Prostate cancer Father   . Hypertension Father   . Breast cancer Maternal Grandmother   . Diabetes Other   . Thyroid disease Neg Hx     Social History: Social History   Tobacco Use  . Smoking status: Current Every Day Smoker    Packs/day: 0.50    Years: 30.00    Pack years: 15.00    Types: Cigarettes  . Smokeless tobacco: Never Used  Substance Use Topics  . Alcohol use: No  . Drug use: No   Social History   Social History Narrative   Right handed   Lives in single story home alone     Review of Systems:  CONSTITUTIONAL: No fevers, chills, night sweats, or weight loss.   EYES: No visual changes or eye pain ENT: No hearing changes.  No history of nose bleeds.   RESPIRATORY: No cough, wheezing and shortness of breath.   CARDIOVASCULAR: Negative for chest pain, and palpitations.   GI: Negative for abdominal discomfort, blood in stools or black stools.  No recent change in bowel habits.   GU:  No history of incontinence.   MUSCLOSKELETAL: No history of joint pain or swelling.  No myalgias.   SKIN: Negative for lesions, rash, and itching.   HEMATOLOGY/ONCOLOGY: Negative for prolonged bleeding, bruising easily, and swollen nodes.  No history of cancer.   ENDOCRINE: Negative for cold or heat intolerance,  polydipsia or goiter.   PSYCH:  No depression or anxiety symptoms.   NEURO: As Above.   Vital Signs:  BP (!) 156/71   Pulse 91   Ht 5\' 5"  (1.651 m)   Wt 135 lb (61.2 kg)   SpO2 100%   BMI 22.47 kg/m    General Medical Exam:   General:  Well appearing, comfortable.   Eyes/ENT: see cranial nerve examination.   Neck:   No carotid bruits. Respiratory:  Clear to auscultation, good air entry bilaterally.   Cardiac:  Regular rate and rhythm, no murmur.   Extremities:  No deformities, edema, or skin discoloration.  Skin:  No rashes or lesions.  Neurological Exam: MENTAL STATUS including orientation to time, place, person, recent and remote memory, attention span and concentration, language, and fund of knowledge is normal.  Speech is not dysarthric.  CRANIAL NERVES: II:  No visual field defects.     III-IV-VI: Pupils equal round and reactive to light.  Normal conjugate, extra-ocular eye movements in all directions of gaze.  No nystagmus.  No ptosis.   V:  Normal facial sensation.    VII:  Normal facial symmetry and movements.   VIII:  Normal hearing and vestibular function.   IX-X:  Normal palatal movement.   XI:  Normal shoulder shrug and head rotation.   XII:   Normal tongue is midline.  MOTOR:  No atrophy, fasciculations or abnormal movements.  No pronator drift.   Upper Extremity:  Right  Left  Deltoid  5/5   5/5   Biceps  5/5   5/5   Triceps  5/5   5/5   Infraspinatus 5/5  5/5  Medial pectoralis 5/5  5/5  Wrist extensors  5/5   5/5   Wrist flexors  5/5   5/5   Finger extensors  5/5   5/5   Finger flexors  5/5   5/5   Dorsal interossei  5-/5   5-/5   Abductor pollicis  5/5   5/5   Tone (Ashworth scale)  0  0   Lower Extremity:  Right  Left  Hip flexors  5/5   5/5   Hip extensors  5/5   5/5   Adductor 5/5  5/5  Abductor 5/5  5/5  Knee flexors  5/5   5/5   Knee extensors  5/5   5/5   Dorsiflexors  5/5   5/5   Plantarflexors  5/5   5/5   Toe extensors  5/5   5/5   Toe flexors  5/5   5/5   Tone (Ashworth scale)  0  0   MSRs:  Right        Left                  brachioradialis 2+  3+  biceps 2+  3+  triceps 2+  3+  patellar 3+  3+  ankle jerk 2+  2+  Hoffman no  no  plantar response down  down   SENSORY:  All sensory modalities including vibration, temperature, pin prick, and light touch is reduced on the left arm and leg, as compared to the left.  COORDINATION/GAIT: Normal finger-to- nose-finger.   Intact rapid alternating movements bilaterally.   Gait narrow based and stable.   IMPRESSION: Cervical myelopathy s/p ACDF C3-C6 with residual paresthesias of the arms and legs, worse on the left side.  She has known myelomalacia and I discussed that this causing localized scarring of the  cord, which is not reversible, resulting in permanent symptoms, which in her case is primary sensory.  She tells me that Dr. Lynann Bologna recommended second surgery, but she did not proceed with this. MRI cervical spine wo contrast will be ordered to evaluate for interval changes. For episodic painful paresthesias, start Cymbalta 30mg  daily.  She has previously tried gabapentin with no relief.   Return to clinic in 4 months.   Thank you for allowing  me to participate in patient's care.  If I can answer any additional questions, I would be pleased to do so.    Sincerely,    Joeanne Robicheaux K. Posey Pronto, DO

## 2019-09-29 ENCOUNTER — Ambulatory Visit
Admission: RE | Admit: 2019-09-29 | Discharge: 2019-09-29 | Disposition: A | Discharge status: Home / Self Care | Payer: Medicaid Other | Source: Ambulatory Visit | Attending: Gastroenterology | Admitting: Gastroenterology

## 2019-09-29 ENCOUNTER — Other Ambulatory Visit: Payer: Self-pay

## 2019-09-29 DIAGNOSIS — K862 Cyst of pancreas: Secondary | ICD-10-CM

## 2019-09-29 MED ORDER — GADOBENATE DIMEGLUMINE 529 MG/ML IV SOLN
13.0000 mL | Freq: Once | INTRAVENOUS | Status: AC | PRN
  Administered 2019-09-29: 13 mL via INTRAVENOUS

## 2019-10-07 ENCOUNTER — Other Ambulatory Visit: Payer: Self-pay | Admitting: Neurology

## 2019-10-12 ENCOUNTER — Other Ambulatory Visit: Payer: Medicaid Other

## 2019-10-21 ENCOUNTER — Telehealth: Payer: Self-pay | Admitting: Neurology

## 2019-10-21 NOTE — Telephone Encounter (Signed)
Called [employer] at [phone] to initial peer to peer as MRI cervical spine was denied (no x-ray, pt did not fail 6 weeks of therapy). Case# [id number] Peer-to-peer scheduled on 2/1 at 9:15a with Dr. Deirdre Evener.

## 2019-10-24 ENCOUNTER — Other Ambulatory Visit: Payer: Self-pay

## 2019-10-24 DIAGNOSIS — M5000 Cervical disc disorder with myelopathy, unspecified cervical region: Secondary | ICD-10-CM

## 2019-10-24 NOTE — Telephone Encounter (Signed)
MRI denied because (1) no cervical x-ray was performed (2) imaging ordered without contrast.   Patient will need cervical x-ray and then resubmit orders for MRI cervical spine wwo contrast.

## 2019-10-24 NOTE — Telephone Encounter (Signed)
Order cervical xrays and mailed to patient per her request.

## 2019-11-02 ENCOUNTER — Telehealth: Payer: Self-pay | Admitting: Neurology

## 2019-11-02 DIAGNOSIS — M5000 Cervical disc disorder with myelopathy, unspecified cervical region: Secondary | ICD-10-CM

## 2019-11-02 NOTE — Telephone Encounter (Signed)
Please inform patient XR shows stable cervical hardware, as noted below.    Patient's MRI cervical spine was previously denied because she did not have x-ray cervical spine. Now that this has been completed, please order MRI cervical spine wwo contrast.  XR cervical spine Aug 19, 1956 at [facility]: The results of her testing postsurgical changes from prior C3-C6 anterior cervical spinal fusion and interbody spacer placement without acute hardware complication.  Minimal spondylitic changes in the cervical spine.   No acute osseous abnormality.

## 2019-11-03 NOTE — Telephone Encounter (Signed)
Patient aware and test ordered

## 2019-11-03 NOTE — Telephone Encounter (Signed)
Left message to call office back

## 2019-11-08 ENCOUNTER — Telehealth (HOSPITAL_COMMUNITY): Payer: Self-pay | Admitting: *Deleted

## 2019-11-08 NOTE — Telephone Encounter (Signed)
PA, Key [id number], for Belsomra 20mg , has been denied.

## 2019-11-10 ENCOUNTER — Other Ambulatory Visit: Payer: Self-pay | Admitting: Gastroenterology

## 2019-11-10 NOTE — Telephone Encounter (Signed)
I don't know this patient.

## 2019-11-28 ENCOUNTER — Telehealth: Payer: Self-pay | Admitting: Neurology

## 2019-11-28 NOTE — Telephone Encounter (Signed)
I have attempted to reach [facility] multiple times and held for 15 min or more.  Unable to reach anyone.  Reviewed order and do not see any issues.  Will wait for [facility] to contact us with issue.

## 2019-11-28 NOTE — Telephone Encounter (Signed)
Patient has a MRI on 01-02-20 at [facility]. She states that [place] [facility] needs something from our office she is not sure what they need

## 2019-11-29 ENCOUNTER — Other Ambulatory Visit: Payer: Self-pay

## 2019-12-01 ENCOUNTER — Ambulatory Visit (INDEPENDENT_AMBULATORY_CARE_PROVIDER_SITE_OTHER): Payer: Medicaid Other | Admitting: Endocrinology

## 2019-12-01 ENCOUNTER — Other Ambulatory Visit: Payer: Self-pay

## 2019-12-01 ENCOUNTER — Encounter: Payer: Self-pay | Admitting: Endocrinology

## 2019-12-01 VITALS — BP 146/68 | HR 93 | Ht 65.0 in | Wt 142.6 lb

## 2019-12-01 DIAGNOSIS — E049 Nontoxic goiter, unspecified: Secondary | ICD-10-CM | POA: Diagnosis not present

## 2019-12-01 LAB — T3, FREE: T3, Free: 3.5 pg/mL (ref 2.3–4.2)

## 2019-12-01 LAB — T4, FREE: Free T4: 0.89 ng/dL (ref 0.60–1.60)

## 2019-12-01 LAB — TSH: TSH: 0.46 u[IU]/mL (ref 0.35–4.50)

## 2019-12-01 NOTE — Progress Notes (Signed)
Patient ID: Tina [name], female   DOB: 09-26-1955, 64 y.o.   MRN: [id number]          Reason for Appointment: Follow-up of thyroid    History of Present Illness:    The patient's thyroid abnormality was first discovered in 03/2018 She apparently was having a routine exam and screening TSH was low This was repeated and was low again at 0.22 by PCP  On her initial consultation in 11/19 since TSH was only borderline at 0.34 she was asked to come back for follow-up only She was found to have a right-sided thyroid enlargement Subsequently she had a thyroid scan in 08/2018 This did not show any abnormal uptake pattern in the thyroid  Recently she says that she feels a little tired at times She is asking about cramping in the left neck area No shakiness, palpitations or heat intolerance  Thyroid levels pending  Lab Results  Component Value Date   TSH 0.42 12/02/2018   TSH 0.34 (L) 07/29/2018   FREET4 0.93 12/02/2018   FREET4 0.95 07/29/2018       Allergies as of 12/01/2019      Reactions   Hydrocodone-acetaminophen Palpitations, Other (See Comments)   Other reaction(s): Dizziness  chest pain   Tramadol Other (See Comments)   "It makes my chest feel funny; I don't like to take that"   Cortizone-10 [hydrocortisone]    Shots- Headaches   Vicodin [hydrocodone-acetaminophen]    Lisinopril Other (See Comments)   cough      Medication List       Accurate as of December 01, 2019  1:10 PM. If you have any questions, ask your nurse or doctor.        amLODipine 10 MG tablet Commonly known as: NORVASC Take 10 mg by mouth daily.   DULoxetine 30 MG capsule Commonly known as: CYMBALTA TAKE 1 CAPSULE BY MOUTH EVERY DAY   famotidine 20 MG tablet Commonly known as: PEPCID Take 1 tablet (20 mg total) by mouth 2 (two) times daily as needed.   lovastatin 20 MG tablet Commonly known as: MEVACOR Take 20 mg by mouth at bedtime.   pantoprazole 20 MG tablet Commonly known  as: PROTONIX Take 20 mg by mouth daily.   TYLENOL 500 MG tablet Generic drug: acetaminophen Take 500 mg as needed by mouth.   valsartan 160 MG tablet Commonly known as: DIOVAN Take 160 mg by mouth daily. for high blood pressure       Allergies:  Allergies  Allergen Reactions  . Hydrocodone-Acetaminophen Palpitations and Other (See Comments)    Other reaction(s): Dizziness  chest pain  . Tramadol Other (See Comments)    "It makes my chest feel funny; I don't like to take that"  . Cortizone-10 [Hydrocortisone]     Shots- Headaches   . Vicodin [Hydrocodone-Acetaminophen]   . Lisinopril Other (See Comments)    cough    Past Medical History:  Diagnosis Date  . Arthritis    "joints; fingers; shoulders; knees; back" (08/27/2012)  . Chronic back pain    epidural injections  . Chronic neck pain    myelopathy  . COPD (chronic obstructive pulmonary disease) (HCC)   . Fibromyalgia    "dx'd long time ago; before they really knew what it was" (112/02/2012)  . Gastric ulcer   . GERD (gastroesophageal reflux disease)    takes Protonix daily  . Heart murmur   . History of bronchitis several yrs ago  . History of colon  polyps   . Hyperlipidemia    takes Lovastatin daily  . Hypertension    takes Losartan daily  . Joint swelling   . Osteoporosis   . Palpitations    takes Atenolol daily  . Pneumonia 2013   hx of  . Prediabetes   . Scoliosis   . Sleep apnea    "have a mask but I don't wear it" (08/27/2012)  . Weakness    numbness in hands and feet    Past Surgical History:  Procedure Laterality Date  . ANTERIOR CERVICAL DECOMP/DISCECTOMY FUSION N/A 07/16/2016   Procedure: ANTERIOR CERVICAL DECOMPRESSION FUSION CERVICAL 3-4, CERVICAL 4-5, CERVICAL 5-6 WITH INSTRUMENATION AND ALLOGRAFT;  Surgeon: Estill Bamberg, MD;  Location: [facility] OR;  Service: Orthopedics;  Laterality: N/A;  ANTERIOR CERVICAL DECOMPRESSION FUSION CERVICAL 3-4, CERVICAL 4-5, CERVICAL 5-6 WITH INSTRUMENATION AND  ALLOGRAFT  . CATARACT EXTRACTION, BILATERAL  2018  . COLONOSCOPY    . ESOPHAGOGASTRODUODENOSCOPY    . fatty tumor removed from right leg     as a teenager  . HIP ARTHROPLASTY  08/27/2012   Procedure: ARTHROPLASTY BIPOLAR HIP;  Surgeon: Velna Ochs, MD;  Location: [facility] OR;  Service: Orthopedics;  Laterality: Left;    Family History  Problem Relation Age of Onset  . Throat cancer Mother   . Stroke Sister   . Seizures Sister   . Dementia Sister   . Lung cancer Brother   . Stroke Sister   . Prostate cancer Father   . Hypertension Father   . Breast cancer Maternal Grandmother   . Diabetes Other   . Thyroid disease Neg Hx     Social History:  reports that she has been smoking cigarettes. She has a 15.00 pack-year smoking history. She has never used smokeless tobacco. She reports that she does not drink alcohol or use drugs.   Review of Systems  HENT: Negative for trouble swallowing.   Cardiovascular: Negative for palpitations.  Endocrine: Positive for fatigue. Negative for cold intolerance and heat intolerance.      Examination:   BP (!) 146/68 (BP Location: Left Arm, Patient Position: Sitting, Cuff Size: Normal)   Pulse 93   Ht 5\' 5"  (1.651 m)   Wt 142 lb 9.6 oz (64.7 kg)   SpO2 98%   BMI 23.73 kg/m   Does not look anxious  The thyroid is palpable only on the right side, about twice normal and smooth, slightly firm  Left lobe is not enlarged There is no lymphadenopathy.     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  Thyroid levels will be checked today and if normal she will follow-up in 1 year    12/01/2019   No visits with results within 1 Week(s) from this visit.   Latest known visit with results is:  Appointment on 08/05/2019  Component Date Value Ref Range Status  . Color, Urine 08/05/2019 YELLOW  Yellow;Lt. Yellow;Straw;Dark Yellow;Amber;Green;Red;Brown Final  . APPearance 08/05/2019 CLEAR  Clear;Turbid;Slightly Cloudy;Cloudy Final  . Specific Gravity, Urine 08/05/2019 <=1.005* 1.000 - 1.030 Final  . pH 08/05/2019 6.0  5.0 - 8.0 Final  . Total Protein, Urine 08/05/2019 NEGATIVE  Negative Final  . Urine Glucose 08/05/2019 NEGATIVE  Negative Final  .  Ketones, ur 08/05/2019 NEGATIVE  Negative Final  . Bilirubin Urine 08/05/2019 NEGATIVE  Negative Final  . Hgb urine dipstick 08/05/2019 NEGATIVE  Negative Final  . Urobilinogen, UA 08/05/2019 0.2  0.0 - 1.0 Final  . Leukocytes,Ua 08/05/2019 NEGATIVE  Negative Final  . Nitrite 08/05/2019 NEGATIVE  Negative Final  . WBC, UA 08/05/2019 0-2/hpf  0-2/hpf Final  . RBC / HPF 08/05/2019 0-2/hpf  0-2/hpf Final  . Squamous Epithelial / LPF 08/05/2019 Rare(0-4/hpf)  Rare(0-4/hpf) Final

## 2019-12-23 ENCOUNTER — Other Ambulatory Visit: Payer: Medicaid Other

## 2019-12-26 ENCOUNTER — Telehealth: Payer: Self-pay | Admitting: Neurology

## 2019-12-26 DIAGNOSIS — R200 Anesthesia of skin: Secondary | ICD-10-CM

## 2019-12-26 DIAGNOSIS — M5000 Cervical disc disorder with myelopathy, unspecified cervical region: Secondary | ICD-10-CM

## 2019-12-26 DIAGNOSIS — Z9889 Other specified postprocedural states: Secondary | ICD-10-CM

## 2019-12-26 NOTE — Telephone Encounter (Signed)
Please see

## 2019-12-26 NOTE — Telephone Encounter (Signed)
Patient states that she has had to cancel two MRI because [facility] is waiting on the Auth  Please call patient she wants to know the status

## 2019-12-26 NOTE — Telephone Encounter (Signed)
Patient has  been scheduled but needs prior authoirzation she states its denied. Can you give me insite?

## 2019-12-26 NOTE — Telephone Encounter (Signed)
Patient called and left a message to follow up about her MRI that is needed.

## 2019-12-29 NOTE — Telephone Encounter (Signed)
Can you give any insite on this? thanks

## 2019-12-29 NOTE — Telephone Encounter (Signed)
Pt is calling back to check the status of the prior Auth please call

## 2020-01-02 ENCOUNTER — Other Ambulatory Visit: Payer: Medicaid Other

## 2020-01-04 NOTE — Telephone Encounter (Signed)
XR cervical spine 11/01/2019 at [facility]: The results of her testing postsurgical changes from prior C3-C6 anterior cervical spinal fusion and interbody spacer placement without acute hardware complication.  Minimal spondylitic changes in the cervical spine.   No acute osseous abnormality.  Please resubmit MRI cervical spine wo contrast for cervical myelopathy, history of cervical surgery, arm numbness.

## 2020-01-05 NOTE — Telephone Encounter (Signed)
Pt called, she received a letter regarding her medicaid being turned down. She is waiting for an MRI to be scheduled. Please call.

## 2020-01-05 NOTE — Telephone Encounter (Signed)
Patient is going to contact [facility].

## 2020-01-10 ENCOUNTER — Telehealth: Payer: Self-pay | Admitting: Endocrinology

## 2020-01-10 NOTE — Telephone Encounter (Signed)
Called pt and gave her MD message. Pt verbalized understanding. 

## 2020-01-10 NOTE — Telephone Encounter (Signed)
Patient called asking if her lab results ever came back. [phone]

## 2020-01-10 NOTE — Telephone Encounter (Signed)
Thyroid levels were normal, she will keep her appointment for next March

## 2020-01-20 ENCOUNTER — Ambulatory Visit: Payer: Medicaid Other | Admitting: Neurology

## 2020-02-23 ENCOUNTER — Encounter (INDEPENDENT_AMBULATORY_CARE_PROVIDER_SITE_OTHER): Payer: Medicaid Other | Admitting: Ophthalmology

## 2020-02-27 NOTE — Progress Notes (Signed)
[facility] - Clinic Note  02/28/2020     CHIEF COMPLAINT Patient presents for Retina Evaluation   HISTORY OF PRESENT ILLNESS: Tina [name] is a 64 y.o. female who presents to the clinic today for:   HPI    Retina Evaluation    In right eye.  This started 1 year ago.  Duration of 1 year.  Associated Symptoms Floaters.  Context:  distance vision, mid-range vision and near vision.  Treatments tried include artificial tears.  Response to treatment was no improvement.  I, the attending physician,  performed the HPI with the patient and updated documentation appropriately.          Comments    64 y/o female pt referred by Dr. Leron Croak, OD for eval of PVD OD.  Pt first diagnosed with PVD OD at [facility] in June of 2020.  Pt saw Dr. Leron Croak about 1 wk ago for CEE and said blurriness and floaters OD were still present after 1 yr.  No problems reported OS.  VA good OS.  Denies pain, FOL.  Uses AT prn OU.  Pt reports floaters OD began shortly after beginning use of AT about 1 yr ago.  Pt reports she "has not been diabetic in years."       Last edited by Rennis Chris, MD on 02/28/2020  8:56 PM. (History)    Patient states started having floaters OD about a year ago, in June 2020. Was seen at [facility], where she was diagnosed with PVD OD. Floaters are still present in vision OD. Patient denies flashes. Using artificial tears for dry eye in both eyes.   Referring physician: Yvette Rack, OD [address] [place],  [place] [address]  HISTORICAL INFORMATION:   Selected notes from the MEDICAL RECORD NUMBER Referred by Dr. Leron Croak for PVD OD   CURRENT MEDICATIONS: No current outpatient medications on file. (Ophthalmic Drugs)   No current facility-administered medications for this visit. (Ophthalmic Drugs)   Current Outpatient Medications (Other)  Medication Sig  . acetaminophen (TYLENOL) 500 MG tablet Take 500 mg as needed by mouth.  Marland Kitchen amLODipine  (NORVASC) 10 MG tablet Take 10 mg by mouth daily.  . DULoxetine (CYMBALTA) 30 MG capsule TAKE 1 CAPSULE BY MOUTH EVERY DAY  . famotidine (PEPCID) 20 MG tablet Take 1 tablet (20 mg total) by mouth 2 (two) times daily as needed.  . lovastatin (MEVACOR) 20 MG tablet Take 20 mg by mouth at bedtime.  . pantoprazole (PROTONIX) 20 MG tablet Take 20 mg by mouth daily.  . valsartan (DIOVAN) 160 MG tablet Take 160 mg by mouth daily. for high blood pressure   No current facility-administered medications for this visit. (Other)      REVIEW OF SYSTEMS: ROS    Positive for: Gastrointestinal, Musculoskeletal, Endocrine, Cardiovascular, Eyes, Respiratory   Negative for: Constitutional, Neurological, Skin, Genitourinary, HENT, Psychiatric, Allergic/Imm, Heme/Lymph   Last edited by Celine Mans, COA on 02/28/2020  1:44 PM. (History)       ALLERGIES Allergies  Allergen Reactions  . Hydrocodone-Acetaminophen Palpitations and Other (See Comments)    Other reaction(s): Dizziness  chest pain  . Tramadol Other (See Comments)    "It makes my chest feel funny; I don't like to take that"  . Cortizone-10 [Hydrocortisone]     Shots- Headaches   . Vicodin [Hydrocodone-Acetaminophen]   . Lisinopril Other (See Comments)    cough    PAST MEDICAL HISTORY  Past Medical History:  Diagnosis Date  . Arthritis    "joints; fingers; shoulders; knees; back" (08/27/2012)  . Chronic back pain    epidural injections  . Chronic neck pain    myelopathy  . COPD (chronic obstructive pulmonary disease) (HCC)   . Fibromyalgia    "dx'd long time ago; before they really knew what it was" (112/02/2012)  . Gastric ulcer   . GERD (gastroesophageal reflux disease)    takes Protonix daily  . Heart murmur   . History of bronchitis several yrs ago  . History of colon polyps   . Hyperlipidemia    takes Lovastatin daily  . Hypertension    takes Losartan daily  . Joint swelling   . Osteoporosis   . Palpitations     takes Atenolol daily  . Pneumonia 2013   hx of  . Prediabetes   . Scoliosis   . Sleep apnea    "have a mask but I don't wear it" (08/27/2012)  . Weakness    numbness in hands and feet   Past Surgical History:  Procedure Laterality Date  . ANTERIOR CERVICAL DECOMP/DISCECTOMY FUSION N/A 07/16/2016   Procedure: ANTERIOR CERVICAL DECOMPRESSION FUSION CERVICAL 3-4, CERVICAL 4-5, CERVICAL 5-6 WITH INSTRUMENATION AND ALLOGRAFT;  Surgeon: Estill Bamberg, MD;  Location: [facility] OR;  Service: Orthopedics;  Laterality: N/A;  ANTERIOR CERVICAL DECOMPRESSION FUSION CERVICAL 3-4, CERVICAL 4-5, CERVICAL 5-6 WITH INSTRUMENATION AND ALLOGRAFT  . CATARACT EXTRACTION Right 05/18/2017   Dr. Marcell Anger  . CATARACT EXTRACTION Left uknown  . CATARACT EXTRACTION, BILATERAL  2018  . COLONOSCOPY    . ESOPHAGOGASTRODUODENOSCOPY    . EYE SURGERY Bilateral    Cat Sx  . fatty tumor removed from right leg     as a teenager  . HIP ARTHROPLASTY  08/27/2012   Procedure: ARTHROPLASTY BIPOLAR HIP;  Surgeon: Velna Ochs, MD;  Location: [facility] OR;  Service: Orthopedics;  Laterality: Left;    FAMILY HISTORY Family History  Problem Relation Age of Onset  . Throat cancer Mother   . Stroke Sister   . Seizures Sister   . Dementia Sister   . Lung cancer Brother   . Stroke Sister   . Prostate cancer Father   . Hypertension Father   . Breast cancer Maternal Grandmother   . Diabetes Other   . Thyroid disease Neg Hx     SOCIAL HISTORY Social History   Tobacco Use  . Smoking status: Current Every Day Smoker    Packs/day: 0.50    Years: 30.00    Pack years: 15.00    Types: Cigarettes  . Smokeless tobacco: Never Used  Substance Use Topics  . Alcohol use: No  . Drug use: No         OPHTHALMIC EXAM:  Base Eye Exam    Visual Acuity (Snellen - Linear)      Right Left   Dist [place] 20/20 -2 20/20       Tonometry (Tonopen, 1:50 PM)      Right Left   Pressure 11 10       Pupils      Dark Light Shape React APD    Right 4 3 Round Brisk None   Left 4 3 Round Brisk None       Visual Fields (Counting fingers)      Left Right    Full Full       Extraocular Movement      Right Left    Full, Ortho  Full, Ortho       Neuro/Psych    Oriented x3: Yes   Mood/Affect: Normal       Dilation    Both eyes: 1.0% Mydriacyl, 2.5% Phenylephrine @ 1:55 PM        Slit Lamp and Fundus Exam    Slit Lamp Exam      Right Left   Lids/Lashes Dermatochalasis - upper lid Dermatochalasis - upper lid   Conjunctiva/Sclera mild melanosis mild melanosis   Cornea arcus arcus, trace PEE   Anterior Chamber deep and clear deep and clear   Iris round and dilated round and dilated   Lens PC IOL in excellent position PC IOL in excellent position, anterior capsular phimosis, 1-2+ PCO   Vitreous syneresis, PVD, Weiss ring syneresis       Fundus Exam      Right Left   Disc pink and sharp pink and sharp   C/D Ratio 0.5 0.6   Macula flat, good foveal reflex, mild RPE mottling, no heme or edema Flat, good foveal reflex, mild RPE mottling, trace ERM, no heme or edema   Vessels mildly tortuous, Vascular attenuation, A/V crossing changes mildly tortuous, Vascular attenuation, A/V crossing changes   Periphery attached, no heme  attached, no heme         Refraction    Manifest Refraction      Sphere Cylinder Dist VA   Right -0.50 Sphere 20/20-2   Left -0.25 Sphere 20/20          IMAGING AND PROCEDURES  Imaging and Procedures for @TODAY @  OCT, Retina - OU - Both Eyes       Right Eye Quality was good. Central Foveal Thickness: 241. Progression has no prior data. Findings include normal foveal contour, no IRF, no SRF (Trace vitreous opacities).   Left Eye Quality was good. Central Foveal Thickness: 234. Progression has no prior data. Findings include normal foveal contour, no SRF, no IRF (Trace ERM, partial PVD).   Notes *Images captured and stored on drive  Diagnosis / Impression:  NFP, no IRF/SRF  OU Trace vitreous opacities OD, trace ERM with partial PVD OS  Clinical management:  See below  Abbreviations: NFP - Normal foveal profile. CME - cystoid macular edema. PED - pigment epithelial detachment. IRF - intraretinal fluid. SRF - subretinal fluid. EZ - ellipsoid zone. ERM - epiretinal membrane. ORA - outer retinal atrophy. ORT - outer retinal tubulation. SRHM - subretinal hyper-reflective material                  ASSESSMENT/PLAN:    ICD-10-CM   1. Posterior vitreous detachment of right eye  H43.811   2. Retinal edema  H35.81 OCT, Retina - OU - Both Eyes  3. Essential hypertension  I10   4. Hypertensive retinopathy of both eyes  H35.033   5. Pseudophakia of both eyes  Z96.1     1.  PVD / vitreous syneresis OD  Chronic symptomatic floaters OD   Onset of PVD -- June 2020 -- diagnosed at [facility]  Exam shows prominent Weiss ring OD  BCVA 20/20  Discussed findings, prognosis and treatment options  No RT or RD on 360 peripheral exam  No intervention recommended at this time  Reviewed s/s of RT/RD  Strict return precautions for any such RT/RD signs/symptoms  F/u here prn  2. No retinal edema on exam or OCT  3,4. Hypertensive retinopathy OU - discussed importance of tight BP control - monitor  5. Pseudophakia OU -  beautiful surgeries -doing well    Ophthalmic Meds Ordered this visit:  No orders of the defined types were placed in this encounter.      Return prn.  There are no Patient Instructions on file for this visit.   Explained the diagnoses, plan, and follow up with the patient and they expressed understanding.  Patient expressed understanding of the importance of proper follow up care.   This document serves as a record of services personally performed by Gardiner Sleeper, MD, PhD. It was created on their behalf by Roselee Nova, COMT. The creation of this record is the provider's dictation and/or activities during the visit.  Electronically signed  by: Roselee Nova, COMT 02/28/20 9:14 PM   Gardiner Sleeper, M.D., Ph.D. Diseases & Surgery of the Retina and Vitreous [facility]  I have reviewed the above documentation for accuracy and completeness, and I agree with the above. Gardiner Sleeper, M.D., Ph.D. 02/28/20 9:14 PM   Abbreviations: M myopia (nearsighted); A astigmatism; H hyperopia (farsighted); P presbyopia; Mrx spectacle prescription;  CTL contact lenses; OD right eye; OS left eye; OU both eyes  XT exotropia; ET esotropia; PEK punctate epithelial keratitis; PEE punctate epithelial erosions; DES dry eye syndrome; MGD meibomian gland dysfunction; ATs artificial tears; PFAT's preservative free artificial tears; [place] nuclear sclerotic cataract; PSC posterior subcapsular cataract; ERM epi-retinal membrane; PVD posterior vitreous detachment; RD retinal detachment; DM diabetes mellitus; DR diabetic retinopathy; NPDR non-proliferative diabetic retinopathy; PDR proliferative diabetic retinopathy; CSME clinically significant macular edema; DME diabetic macular edema; dbh dot blot hemorrhages; CWS cotton wool spot; POAG primary open angle glaucoma; C/D cup-to-disc ratio; HVF humphrey visual field; GVF goldmann visual field; OCT optical coherence tomography; IOP intraocular pressure; BRVO Branch retinal vein occlusion; CRVO central retinal vein occlusion; CRAO central retinal artery occlusion; BRAO branch retinal artery occlusion; RT retinal tear; SB scleral buckle; PPV pars plana vitrectomy; VH Vitreous hemorrhage; PRP panretinal laser photocoagulation; IVK intravitreal kenalog; VMT vitreomacular traction; MH Macular hole;  NVD neovascularization of the disc; NVE neovascularization elsewhere; AREDS age related eye disease study; ARMD age related macular degeneration; POAG primary open angle glaucoma; EBMD epithelial/anterior basement membrane dystrophy; ACIOL anterior chamber intraocular lens; IOL intraocular lens; PCIOL posterior  chamber intraocular lens; Phaco/IOL phacoemulsification with intraocular lens placement; [place] photorefractive keratectomy; LASIK laser assisted in situ keratomileusis; HTN hypertension; DM diabetes mellitus; COPD chronic obstructive pulmonary disease

## 2020-02-28 ENCOUNTER — Encounter (INDEPENDENT_AMBULATORY_CARE_PROVIDER_SITE_OTHER): Payer: Self-pay | Admitting: Ophthalmology

## 2020-02-28 ENCOUNTER — Ambulatory Visit (INDEPENDENT_AMBULATORY_CARE_PROVIDER_SITE_OTHER): Payer: Medicaid Other | Admitting: Ophthalmology

## 2020-02-28 ENCOUNTER — Other Ambulatory Visit: Payer: Self-pay

## 2020-02-28 DIAGNOSIS — H35033 Hypertensive retinopathy, bilateral: Secondary | ICD-10-CM | POA: Diagnosis not present

## 2020-02-28 DIAGNOSIS — H43811 Vitreous degeneration, right eye: Secondary | ICD-10-CM

## 2020-02-28 DIAGNOSIS — Z961 Presence of intraocular lens: Secondary | ICD-10-CM

## 2020-02-28 DIAGNOSIS — H3581 Retinal edema: Secondary | ICD-10-CM | POA: Diagnosis not present

## 2020-02-28 DIAGNOSIS — I10 Essential (primary) hypertension: Secondary | ICD-10-CM | POA: Diagnosis not present

## 2020-03-22 ENCOUNTER — Telehealth: Payer: Self-pay | Admitting: Gastroenterology

## 2020-03-22 ENCOUNTER — Other Ambulatory Visit: Payer: Self-pay

## 2020-03-22 DIAGNOSIS — R9389 Abnormal findings on diagnostic imaging of other specified body structures: Secondary | ICD-10-CM

## 2020-03-22 DIAGNOSIS — K862 Cyst of pancreas: Secondary | ICD-10-CM

## 2020-03-22 NOTE — Progress Notes (Signed)
error 

## 2020-03-22 NOTE — Telephone Encounter (Signed)
Returned patient's call.  She is not due for MRCP to monitor pancreatic cyst until 1 - 2022. Patient's last MRI/MRCP was in 09-2019.

## 2020-03-22 NOTE — Telephone Encounter (Signed)
Patient returned your call, please call patient one more time.   

## 2020-04-08 ENCOUNTER — Other Ambulatory Visit: Payer: Medicaid Other

## 2020-05-01 ENCOUNTER — Other Ambulatory Visit: Payer: Medicaid Other

## 2020-05-02 ENCOUNTER — Other Ambulatory Visit: Payer: Medicaid Other

## 2020-05-04 ENCOUNTER — Ambulatory Visit
Admission: RE | Admit: 2020-05-04 | Discharge: 2020-05-04 | Disposition: A | Discharge status: Home / Self Care | Payer: Medicaid Other | Source: Ambulatory Visit | Attending: Neurology | Admitting: Neurology

## 2020-05-04 ENCOUNTER — Other Ambulatory Visit: Payer: Self-pay

## 2020-05-04 DIAGNOSIS — M5000 Cervical disc disorder with myelopathy, unspecified cervical region: Secondary | ICD-10-CM

## 2020-05-04 MED ORDER — GADOBENATE DIMEGLUMINE 529 MG/ML IV SOLN
15.0000 mL | Freq: Once | INTRAVENOUS | Status: AC | PRN
  Administered 2020-05-04: 15 mL via INTRAVENOUS

## 2020-05-07 ENCOUNTER — Telehealth: Payer: Self-pay | Admitting: Neurology

## 2020-05-07 ENCOUNTER — Ambulatory Visit: Payer: Medicaid Other | Admitting: Neurology

## 2020-05-07 NOTE — Telephone Encounter (Signed)
Please inform patient that her MRI shows no recurrence of stenosis/narrowing and prior surgery looks good.  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  If she would like to discuss further, see if she is able to do a video visit or telephone.  We can probably see her soon as a virtual visit.  Thanks.

## 2020-05-07 NOTE — Telephone Encounter (Signed)
Spoke with pt who states she is wanting MRI results and what the plan is based on these results. Her transportation never showed up this AM so she missed her appt. She is on the schedule for 06/2020. Told her I'd get some info from Dr Allena Katz and give her a call back, she verbalized understanding.

## 2020-05-07 NOTE — Telephone Encounter (Signed)
Transportation company called to ask if patients appointment could be moved later today. After being notified that a later appointment wasn't available the patient was brought onto the phone call. Patient states she's having issues with the insurance company providing transportation for her. She is needing to discuss the MRI results with the dr as well as follow up with her. Rescheduled for October and put on waitlist but patient requested to make dr patel aware of the issues she's having.

## 2020-05-07 NOTE — Telephone Encounter (Signed)
Spoke with pt and told her per Dr Allena Katz - Please inform patient that her MRI shows no recurrence of stenosis/narrowing and prior surgery looks good.  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  If she would like to discuss further, see if she is able to do a video visit or telephone.  We can probably see her soon as a virtual visit. Pt verbalized understanding of MRI results. Offered her the option of a video or phone visit, she would like to do a phone visit. Message sent to scheduler to set up an appt.

## 2020-05-09 NOTE — Progress Notes (Signed)
[facility] - Clinic Note  05/11/2020     CHIEF COMPLAINT Patient presents for Retina Follow Up   HISTORY OF PRESENT ILLNESS: Tina [name] is a 64 y.o. female who presents to the clinic today for:   HPI    Retina Follow Up    Patient presents with  PVD.  In both eyes.  Severity is moderate.  Duration of 2 months.  Since onset it is gradually worsening.  I, the attending physician,  performed the HPI with the patient and updated documentation appropriately.          Comments    Pt here for increased fol and floaters OS, pts last exam was June 8 for PVD OD, she states floaters in right eye are still there, but the floaters in the left eye are new and block her vision, she states she sees flashes in her left eye only, she has seen them every day, multiple times a day since they started, she states they are mostly temporal and usually when she looks to the left, she is using Refresh QID OU for dryness       Last edited by Rennis Chris, MD on 05/11/2020  9:23 AM. (History)    Patient states she has been seeing new fol and floaters in her left eye for over a week, she states since they started they have gotten worse and block her vision, she sees fol when she moves her eyes temporally  Referring physician: Yvette Rack, OD [address] [place],  [place] [address]  HISTORICAL INFORMATION:   Selected notes from the MEDICAL RECORD NUMBER Referred by Dr. Leron Croak for PVD OD   CURRENT MEDICATIONS: No current outpatient medications on file. (Ophthalmic Drugs)   No current facility-administered medications for this visit. (Ophthalmic Drugs)   Current Outpatient Medications (Other)  Medication Sig  . acetaminophen (TYLENOL) 500 MG tablet Take 500 mg as needed by mouth.  Marland Kitchen amLODipine (NORVASC) 10 MG tablet Take 10 mg by mouth daily.  . cetirizine (ZYRTEC) 10 MG tablet Take 10 mg by mouth daily.  . famotidine (PEPCID) 20 MG tablet Take 1 tablet (20 mg total) by  mouth 2 (two) times daily as needed.  . lovastatin (MEVACOR) 20 MG tablet Take 20 mg by mouth at bedtime.  . pantoprazole (PROTONIX) 20 MG tablet Take 20 mg by mouth daily.  Marland Kitchen tiZANidine (ZANAFLEX) 2 MG tablet Take 1 tablet (2 mg total) by mouth at bedtime as needed for muscle spasms.  . valsartan (DIOVAN) 160 MG tablet Take 160 mg by mouth daily. for high blood pressure   No current facility-administered medications for this visit. (Other)      REVIEW OF SYSTEMS: ROS    Positive for: Cardiovascular, Eyes, Respiratory   Negative for: Constitutional, Gastrointestinal, Neurological, Skin, Genitourinary, Musculoskeletal, HENT, Endocrine, Psychiatric, Allergic/Imm, Heme/Lymph   Last edited by Posey Boyer, COT on 05/11/2020  9:09 AM. (History)       ALLERGIES Allergies  Allergen Reactions  . Hydrocodone-Acetaminophen Palpitations and Other (See Comments)    Other reaction(s): Dizziness  chest pain  . Tramadol Other (See Comments)    "It makes my chest feel funny; I don't like to take that"  . Cortizone-10 [Hydrocortisone]     Shots- Headaches   . Vicodin [Hydrocodone-Acetaminophen]   . Lisinopril Other (See Comments)    cough    PAST MEDICAL HISTORY Past Medical History:  Diagnosis Date  . Arthritis    "  joints; fingers; shoulders; knees; back" (08/27/2012)  . Chronic back pain    epidural injections  . Chronic neck pain    myelopathy  . COPD (chronic obstructive pulmonary disease) (HCC)   . Fibromyalgia    "dx'd long time ago; before they really knew what it was" (112/02/2012)  . Gastric ulcer   . GERD (gastroesophageal reflux disease)    takes Protonix daily  . Heart murmur   . History of bronchitis several yrs ago  . History of colon polyps   . Hyperlipidemia    takes Lovastatin daily  . Hypertension    takes Losartan daily  . Joint swelling   . Osteoporosis   . Palpitations    takes Atenolol daily  . Pneumonia 2013   hx of  . Prediabetes   . Scoliosis    . Sleep apnea    "have a mask but I don't wear it" (08/27/2012)  . Weakness    numbness in hands and feet   Past Surgical History:  Procedure Laterality Date  . ANTERIOR CERVICAL DECOMP/DISCECTOMY FUSION N/A 07/16/2016   Procedure: ANTERIOR CERVICAL DECOMPRESSION FUSION CERVICAL 3-4, CERVICAL 4-5, CERVICAL 5-6 WITH INSTRUMENATION AND ALLOGRAFT;  Surgeon: Estill Bamberg, MD;  Location: [facility] OR;  Service: Orthopedics;  Laterality: N/A;  ANTERIOR CERVICAL DECOMPRESSION FUSION CERVICAL 3-4, CERVICAL 4-5, CERVICAL 5-6 WITH INSTRUMENATION AND ALLOGRAFT  . CATARACT EXTRACTION Right 05/18/2017   Dr. Marcell Anger  . CATARACT EXTRACTION Left uknown  . CATARACT EXTRACTION, BILATERAL  2018  . COLONOSCOPY    . ESOPHAGOGASTRODUODENOSCOPY    . EYE SURGERY Bilateral    Cat Sx  . fatty tumor removed from right leg     as a teenager  . HIP ARTHROPLASTY  08/27/2012   Procedure: ARTHROPLASTY BIPOLAR HIP;  Surgeon: Velna Ochs, MD;  Location: [facility] OR;  Service: Orthopedics;  Laterality: Left;    FAMILY HISTORY Family History  Problem Relation Age of Onset  . Throat cancer Mother   . Stroke Sister   . Seizures Sister   . Dementia Sister   . Lung cancer Brother   . Stroke Sister   . Prostate cancer Father   . Hypertension Father   . Breast cancer Maternal Grandmother   . Diabetes Other   . Thyroid disease Neg Hx     SOCIAL HISTORY Social History   Tobacco Use  . Smoking status: Current Every Day Smoker    Packs/day: 0.50    Years: 30.00    Pack years: 15.00    Types: Cigarettes  . Smokeless tobacco: Never Used  Vaping Use  . Vaping Use: Never used  Substance Use Topics  . Alcohol use: No  . Drug use: No         OPHTHALMIC EXAM:  Base Eye Exam    Visual Acuity (Snellen - Linear)      Right Left   Dist [place] 20/30 +2 20/30 +2   Dist ph [place] 20/25 -1 NI       Tonometry (Tonopen, 9:15 AM)      Right Left   Pressure 07 06       Pupils      Dark Light Shape React APD   Right 3  2 Round Brisk None   Left 3 2 Round Brisk None       Visual Fields (Counting fingers)      Left Right    Full Full       Extraocular Movement      Right  Left    Full, Ortho Full, Ortho       Neuro/Psych    Oriented x3: Yes   Mood/Affect: Normal       Dilation    Both eyes: 1.0% Mydriacyl, 2.5% Phenylephrine @ 9:16 AM        Slit Lamp and Fundus Exam    Slit Lamp Exam      Right Left   Lids/Lashes Dermatochalasis - upper lid Dermatochalasis - upper lid   Conjunctiva/Sclera mild melanosis mild melanosis   Cornea arcus, well healed temporal cataract wounds arcus, well healed temporal cataract wounds   Anterior Chamber deep and clear deep and clear   Iris round and moderately dilated round and moderately dilated   Lens PC IOL in excellent position PC IOL in excellent position, anterior capsular phimosis, 1-2+ PCO   Vitreous syneresis, PVD, Weiss ring syneresis, Posterior vitreous detachment, mild vitreous condensations       Fundus Exam      Right Left   Disc pink and sharp pink and sharp   C/D Ratio 0.5 0.6   Macula flat, good foveal reflex, mild RPE mottling, no heme or edema, trace ERM Flat, good foveal reflex, mild RPE mottling, trace ERM, no heme or edema   Vessels mildly tortuous, Vascular attenuation, A/V crossing changes, mild Copper wiring mildly tortuous, Vascular attenuation, mild A/V crossing changes   Periphery attached, no heme  attached, no heme, No RT/RD on 360 scleral depression           IMAGING AND PROCEDURES  Imaging and Procedures for @TODAY @  OCT, Retina - OU - Both Eyes       Right Eye Quality was good. Central Foveal Thickness: 246. Progression has been stable. Findings include normal foveal contour, no IRF, no SRF.   Left Eye Quality was good. Central Foveal Thickness: 235. Progression has been stable. Findings include normal foveal contour, no SRF, no IRF (Trace vitreous opacities; interval release of partial PVD).   Notes *Images  captured and stored on drive  Diagnosis / Impression:  NFP, no IRF/SRF OU [id number]:Trace vitreous opacities; interval release of partial PVD   Clinical management:  See below  Abbreviations: NFP - Normal foveal profile. CME - cystoid macular edema. PED - pigment epithelial detachment. IRF - intraretinal fluid. SRF - subretinal fluid. EZ - ellipsoid zone. ERM - epiretinal membrane. ORA - outer retinal atrophy. ORT - outer retinal tubulation. SRHM - subretinal hyper-reflective material                  ASSESSMENT/PLAN:    ICD-10-CM   1. Posterior vitreous detachment of both eyes  H43.813   2. Retinal edema  H35.81 OCT, Retina - OU - Both Eyes  3. Essential hypertension  I10   4. Hypertensive retinopathy of both eyes  H35.033   5. Pseudophakia of both eyes  Z96.1     1.  PVD / vitreous syneresis OU  - pt presents acutely today for new onset, symptomatic flashes and floaters OS for over a week  - Chronic symptomatic floaters OD  - [facility] PVD OD June 2020 [facility]  - Exam shows prominent Weiss ring OD, OS with significant vit condensations  - BCVA 20/30 OU -- decreased from 20/20 OU  - Discussed findings, prognosis and treatment options  - No RT or RD on 360 scleral depressed exam OS  - No intervention recommended at this time  - Reviewed s/s of RT/RD  - Strict return  precautions for any such RT/RD signs/symptoms  - F/u 4-6 weeks, DFE, OCT  2. No retinal edema on exam or OCT  3,4. Hypertensive retinopathy OU  - discussed importance of tight BP control  - monitor  5. Pseudophakia OU  - beautiful surgeries  - doing well    Ophthalmic Meds Ordered this visit:  No orders of the defined types were placed in this encounter.      Return for f/u 4-6 weeks, PVD OS, DFE, OCT.  There are no Patient Instructions on file for this visit.   Explained the diagnoses, plan, and follow up with the patient and they expressed understanding.  Patient expressed  understanding of the importance of proper follow up care.   This document serves as a record of services personally performed by Karie Chimera, MD, PhD. It was created on their behalf by Glee Arvin. Manson Passey, OA an ophthalmic technician. The creation of this record is the provider's dictation and/or activities during the visit.    Electronically signed by: Glee Arvin. Manson Passey, [place] 08.18.2021 1:07 PM   Karie Chimera, M.D., Ph.D. Diseases & Surgery of the Retina and Vitreous [facility]  I have reviewed the above documentation for accuracy and completeness, and I agree with the above. Karie Chimera, M.D., Ph.D. 05/11/20 1:07 PM   Abbreviations: M myopia (nearsighted); A astigmatism; H hyperopia (farsighted); P presbyopia; Mrx spectacle prescription;  CTL contact lenses; OD right eye; OS left eye; OU both eyes  XT exotropia; ET esotropia; PEK punctate epithelial keratitis; PEE punctate epithelial erosions; DES dry eye syndrome; MGD meibomian gland dysfunction; ATs artificial tears; PFAT's preservative free artificial tears; NSC nuclear sclerotic cataract; PSC posterior subcapsular cataract; ERM epi-retinal membrane; PVD posterior vitreous detachment; RD retinal detachment; DM diabetes mellitus; DR diabetic retinopathy; NPDR non-proliferative diabetic retinopathy; PDR proliferative diabetic retinopathy; CSME clinically significant macular edema; DME diabetic macular edema; dbh dot blot hemorrhages; CWS cotton wool spot; POAG primary open angle glaucoma; C/D cup-to-disc ratio; HVF humphrey visual field; GVF goldmann visual field; OCT optical coherence tomography; IOP intraocular pressure; BRVO Branch retinal vein occlusion; CRVO central retinal vein occlusion; CRAO central retinal artery occlusion; BRAO branch retinal artery occlusion; RT retinal tear; SB scleral buckle; PPV pars plana vitrectomy; VH Vitreous hemorrhage; PRP panretinal laser photocoagulation; IVK intravitreal kenalog;  VMT vitreomacular traction; MH Macular hole;  NVD neovascularization of the disc; NVE neovascularization elsewhere; AREDS age related eye disease study; ARMD age related macular degeneration; POAG primary open angle glaucoma; EBMD epithelial/anterior basement membrane dystrophy; ACIOL anterior chamber intraocular lens; IOL intraocular lens; PCIOL posterior chamber intraocular lens; Phaco/IOL phacoemulsification with intraocular lens placement; PRK photorefractive keratectomy; LASIK laser assisted in situ keratomileusis; HTN hypertension; DM diabetes mellitus; COPD chronic obstructive pulmonary disease

## 2020-05-10 ENCOUNTER — Other Ambulatory Visit: Payer: Self-pay

## 2020-05-10 ENCOUNTER — Telehealth (INDEPENDENT_AMBULATORY_CARE_PROVIDER_SITE_OTHER): Payer: Medicaid Other | Admitting: Neurology

## 2020-05-10 ENCOUNTER — Encounter: Payer: Self-pay | Admitting: Neurology

## 2020-05-10 VITALS — Ht 65.0 in | Wt 143.0 lb

## 2020-05-10 DIAGNOSIS — G959 Disease of spinal cord, unspecified: Secondary | ICD-10-CM

## 2020-05-10 DIAGNOSIS — M62838 Other muscle spasm: Secondary | ICD-10-CM

## 2020-05-10 DIAGNOSIS — M5000 Cervical disc disorder with myelopathy, unspecified cervical region: Secondary | ICD-10-CM

## 2020-05-10 MED ORDER — TIZANIDINE HCL 2 MG PO TABS: 2.0000 mg | ORAL_TABLET | Freq: Every evening | ORAL | 3 refills | Status: DC | PRN

## 2020-05-10 NOTE — Progress Notes (Signed)
Order placed and faxed information to [facility]

## 2020-05-10 NOTE — Progress Notes (Signed)
   Due to the COVID-19 crisis, this virtual check-in visit was done via telephone from my office and it was initiated and consent given by this patient and or family.  Telephone (Audio) Visit The purpose of this telephone visit is to provide medical care while limiting exposure to the novel coronavirus.    Consent was obtained for telephone visit and initiated by pt/family:  Yes.   Answered questions that patient had about telehealth interaction:  Yes.   I discussed the limitations, risks, security and privacy concerns of performing an evaluation and management service by telephone. I also discussed with the patient that there may be a patient responsible charge related to this service. The patient expressed understanding and agreed to proceed.  Pt location: Home Physician Location: office Name of referring provider:  Driggers, Gerome Apley, PA-C I connected with .Tina [name] at patients initiation/request on 05/10/2020 at  8:50 AM EDT by telephone and verified that I am speaking with the correct person using two identifiers.  Pt MRN:  [id number] Pt DOB:  07-11-1956  Assessment/Plan:   Cervical myelopathy s/p ACDF C3-6 with residual paresthesias and spasm.  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   - Start physical therapy for neck stretching  - Start tizanidine 2mg  at bedtime as needed for neck pain/spasm   Subjective:   She had MRI cervical spine because of ongoing numbness of the arms and leg since her neck surgery in 2017.  Imaging shows stable ACDF from C3-C6, small disc protrusion at C2-3 and C6-7 and myelomalacia at C4-5. Specifically, no new areas of stenosis. She continues to have numbness of the arms and legs and neck spasms.  No new weakness or new complaints.       Objective:   Vitals:   05/10/20 0740  Weight: 143 lb (64.9 kg)  Height: 5\' 5"  (1.651 m)   DATA: MRI cervical spine wo contrast 05/05/2020: 1. Prior ACDF at C3 through C6 without residual spinal stenosis. 2. Small central to right paracentral disc protrusion at C2-3 without significant spinal stenosis or cord deformity. 3. Mild disc bulge at C6-7 without stenosis or impingement. 4. Small focus of chronic myelomalacia at the level of C4-5.   Follow Up Instructions:      -I discussed the assessment and treatment plan with the patient. The patient was provided an opportunity to ask questions and all were answered. The patient agreed with the plan and demonstrated an understanding of the instructions.   The patient was advised to call back or seek an in-person evaluation if the symptoms worsen or if the condition fails to improve as anticipated.  Return to clinic in 6 months  Total Time spent in visit with the patient was:  12 min, of which 100% of the time was spent in counseling and/or coordinating care.   Pt understands and agrees with the plan of care outlined.     , DO

## 2020-05-11 ENCOUNTER — Ambulatory Visit (INDEPENDENT_AMBULATORY_CARE_PROVIDER_SITE_OTHER): Payer: Medicaid Other | Admitting: Ophthalmology

## 2020-05-11 ENCOUNTER — Encounter (INDEPENDENT_AMBULATORY_CARE_PROVIDER_SITE_OTHER): Payer: Self-pay | Admitting: Ophthalmology

## 2020-05-11 DIAGNOSIS — Z961 Presence of intraocular lens: Secondary | ICD-10-CM

## 2020-05-11 DIAGNOSIS — H43813 Vitreous degeneration, bilateral: Secondary | ICD-10-CM | POA: Diagnosis not present

## 2020-05-11 DIAGNOSIS — I10 Essential (primary) hypertension: Secondary | ICD-10-CM

## 2020-05-11 DIAGNOSIS — H35033 Hypertensive retinopathy, bilateral: Secondary | ICD-10-CM | POA: Diagnosis not present

## 2020-05-11 DIAGNOSIS — H3581 Retinal edema: Secondary | ICD-10-CM | POA: Diagnosis not present

## 2020-05-11 DIAGNOSIS — H43811 Vitreous degeneration, right eye: Secondary | ICD-10-CM

## 2020-05-14 ENCOUNTER — Telehealth: Payer: Self-pay | Admitting: Neurology

## 2020-05-14 ENCOUNTER — Telehealth: Payer: Self-pay | Admitting: Gastroenterology

## 2020-05-14 NOTE — Telephone Encounter (Signed)
Patient advised she is due for repeat MRI in January 2022 not now.  She thanked me for the call

## 2020-05-14 NOTE — Telephone Encounter (Signed)
[facility] still hasn't received full referral. They're only receiving the cover page. Suggested sending the referral to their [place] office to see if it will go through to them. Fax # [phone].

## 2020-05-14 NOTE — Telephone Encounter (Signed)
Pt would like to know if she still needs to schedule her MRI.

## 2020-05-15 NOTE — Telephone Encounter (Signed)
Refaxed referral. Spoke with Clydie Braun and informed her that if she does not receive my referral by the end of the day to give me a call. She voiced understanding.

## 2020-05-30 ENCOUNTER — Encounter (INDEPENDENT_AMBULATORY_CARE_PROVIDER_SITE_OTHER): Payer: Medicaid Other | Admitting: Ophthalmology

## 2020-06-11 NOTE — Progress Notes (Signed)
[facility] - Clinic Note  06/13/2020     CHIEF COMPLAINT Patient presents for Retina Follow Up   HISTORY OF PRESENT ILLNESS: Tina [name] is a 64 y.o. female who presents to the clinic today for:   HPI    Retina Follow Up    Patient presents with  PVD.  In both eyes.  This started months ago.  Severity is moderate.  Duration of 5 weeks.  Since onset it is stable.  I, the attending physician,  performed the HPI with the patient and updated documentation appropriately.          Comments    64 y/o female pt here for 5 wk f/u for PVD OU.  No change in [place] OU, but a few days ago her vision OU became very blurred for several hours and nothing, including AT would make it better.  Symptoms eventually resolved on their own.  Denies pain, FOL.  Still has a few floaters OU.  Refresh prn OU.  BS and A1C unknown.       Last edited by Rennis ChrisZamora, Jalen Oberry, MD on 06/13/2020 10:16 PM. (History)    Patient states floaters are the same as last time, she states when she looks down she sees a "brown hair" in her vision, she thought it was something on her face, but now thinks it's in her vision, she still sees fol when it's dark  Referring physician: Yvette RackHager, Ray A, OD [address] [place],  [place] [id number]  HISTORICAL INFORMATION:   Selected notes from the MEDICAL RECORD NUMBER Referred by Dr. Leron CroakHager for PVD OD   CURRENT MEDICATIONS: No current outpatient medications on file. (Ophthalmic Drugs)   No current facility-administered medications for this visit. (Ophthalmic Drugs)   Current Outpatient Medications (Other)  Medication Sig  . acetaminophen (TYLENOL) 500 MG tablet Take 500 mg as needed by mouth.  Marland Kitchen. amLODipine (NORVASC) 10 MG tablet Take 10 mg by mouth daily.  . cetirizine (ZYRTEC) 10 MG tablet Take 10 mg by mouth daily.  . famotidine (PEPCID) 20 MG tablet Take 1 tablet (20 mg total) by mouth 2 (two) times daily as needed.  . fluticasone (FLONASE) 50 MCG/ACT nasal  spray Place 2 sprays into both nostrils daily.  Marland Kitchen. lovastatin (MEVACOR) 20 MG tablet Take 20 mg by mouth at bedtime.  . pantoprazole (PROTONIX) 20 MG tablet Take 20 mg by mouth daily.  Marland Kitchen. tiZANidine (ZANAFLEX) 2 MG tablet Take 1 tablet (2 mg total) by mouth at bedtime as needed for muscle spasms.  . valsartan (DIOVAN) 160 MG tablet Take 160 mg by mouth daily. for high blood pressure   No current facility-administered medications for this visit. (Other)      REVIEW OF SYSTEMS: ROS    Positive for: Gastrointestinal, Musculoskeletal, Endocrine, Eyes, Respiratory   Negative for: Constitutional, Neurological, Skin, Genitourinary, HENT, Cardiovascular, Psychiatric, Allergic/Imm, Heme/Lymph   Last edited by Celine MansBaxley, Andrew G, COA on 06/13/2020  1:09 PM. (History)       ALLERGIES Allergies  Allergen Reactions  . Hydrocodone-Acetaminophen Palpitations and Other (See Comments)    Other reaction(s): Dizziness  chest pain  . Tramadol Other (See Comments)    "It makes my chest feel funny; I don't like to take that"  . Cortizone-10 [Hydrocortisone]     Shots- Headaches   . Vicodin [Hydrocodone-Acetaminophen]   . Lisinopril Other (See Comments)    cough    PAST MEDICAL HISTORY Past Medical History:  Diagnosis Date  .  Arthritis    "joints; fingers; shoulders; knees; back" (08/27/2012)  . Chronic back pain    epidural injections  . Chronic neck pain    myelopathy  . COPD (chronic obstructive pulmonary disease) (HCC)   . Fibromyalgia    "dx'd long time ago; before they really knew what it was" (112/02/2012)  . Gastric ulcer   . GERD (gastroesophageal reflux disease)    takes Protonix daily  . Heart murmur   . History of bronchitis several yrs ago  . History of colon polyps   . Hyperlipidemia    takes Lovastatin daily  . Hypertension    takes Losartan daily  . Hypertensive retinopathy    OU  . Joint swelling   . Osteoporosis   . Palpitations    takes Atenolol daily  . Pneumonia  2013   hx of  . Prediabetes   . Scoliosis   . Sleep apnea    "have a mask but I don't wear it" (08/27/2012)  . Weakness    numbness in hands and feet   Past Surgical History:  Procedure Laterality Date  . ANTERIOR CERVICAL DECOMP/DISCECTOMY FUSION N/A 07/16/2016   Procedure: ANTERIOR CERVICAL DECOMPRESSION FUSION CERVICAL 3-4, CERVICAL 4-5, CERVICAL 5-6 WITH INSTRUMENATION AND ALLOGRAFT;  Surgeon: Estill Bamberg, MD;  Location: [facility] OR;  Service: Orthopedics;  Laterality: N/A;  ANTERIOR CERVICAL DECOMPRESSION FUSION CERVICAL 3-4, CERVICAL 4-5, CERVICAL 5-6 WITH INSTRUMENATION AND ALLOGRAFT  . CATARACT EXTRACTION Right 05/18/2017   Dr. Marcell Anger  . CATARACT EXTRACTION Left uknown  . CATARACT EXTRACTION, BILATERAL  2018  . COLONOSCOPY    . ESOPHAGOGASTRODUODENOSCOPY    . EYE SURGERY Bilateral    Cat Sx  . fatty tumor removed from right leg     as a teenager  . HIP ARTHROPLASTY  08/27/2012   Procedure: ARTHROPLASTY BIPOLAR HIP;  Surgeon: Velna Ochs, MD;  Location: [facility] OR;  Service: Orthopedics;  Laterality: Left;    FAMILY HISTORY Family History  Problem Relation Age of Onset  . Throat cancer Mother   . Stroke Sister   . Seizures Sister   . Dementia Sister   . Lung cancer Brother   . Stroke Sister   . Prostate cancer Father   . Hypertension Father   . Breast cancer Maternal Grandmother   . Diabetes Other   . Thyroid disease Neg Hx     SOCIAL HISTORY Social History   Tobacco Use  . Smoking status: Current Every Day Smoker    Packs/day: 0.50    Years: 30.00    Pack years: 15.00    Types: Cigarettes  . Smokeless tobacco: Never Used  Vaping Use  . Vaping Use: Never used  Substance Use Topics  . Alcohol use: No  . Drug use: No         OPHTHALMIC EXAM:  Base Eye Exam    Visual Acuity (Snellen - Linear)      Right Left   Dist [place] 20/30 -2 20/30   Dist ph [place] 20/25 -2 20/25 -  Pt c/o horizontal diplopia while testing OD [facility].       Tonometry (Tonopen, 1:12  PM)      Right Left   Pressure 10 8       Pupils      Dark Light Shape React APD   Right 3 2 Round Brisk None   Left 3 2 Round Brisk None       Visual Fields (Counting fingers)  Left Right    Full Full       Extraocular Movement      Right Left    Full, Ortho Full, Ortho       Neuro/Psych    Oriented x3: Yes   Mood/Affect: Normal       Dilation    Both eyes: 1.0% Mydriacyl, 2.5% Phenylephrine @ 1:12 PM        Slit Lamp and Fundus Exam    Slit Lamp Exam      Right Left   Lids/Lashes Dermatochalasis - upper lid Dermatochalasis - upper lid   Conjunctiva/Sclera mild melanosis mild melanosis   Cornea arcus, well healed temporal cataract wounds, trace Punctate epithelial erosions arcus, well healed temporal cataract wounds, trace Punctate epithelial erosions   Anterior Chamber deep and clear deep and clear   Iris round and moderately dilated round and moderately dilated   Lens PC IOL in excellent position PC IOL in excellent position, anterior capsular phimosis, 1-2+ PCO   Vitreous syneresis, PVD, Weiss ring syneresis, Posterior vitreous detachment, mild vitreous condensations       Fundus Exam      Right Left   Disc pink and sharp pink and sharp   C/D Ratio 0.5 0.6   Macula flat, good foveal reflex, mild RPE mottling, no heme or edema, trace ERM Flat, good foveal reflex, mild RPE mottling, trace ERM, no heme or edema   Vessels mildly tortuous, Vascular attenuation, A/V crossing changes, mild Copper wiring mildly tortuous, Vascular attenuation, mild A/V crossing changes   Periphery attached, no heme, mild White without pressure nasally attached, no heme, No RT/RD on 360 scleral depression           IMAGING AND PROCEDURES  Imaging and Procedures for @TODAY @  OCT, Retina - OU - Both Eyes       Right Eye Quality was good. Central Foveal Thickness: 241. Progression has been stable. Findings include normal foveal contour, no IRF, no SRF.   Left Eye Quality  was good. Central Foveal Thickness: 236. Progression has been stable. Findings include normal foveal contour, no SRF, no IRF (Interval improvement in Trace vitreous opacities; partial PVD).   Notes *Images captured and stored on drive  Diagnosis / Impression:  NFP, no IRF/SRF OU OS: interval improvement in trace vitreous opacities; partial PVD   Clinical management:  See below  Abbreviations: NFP - Normal foveal profile. CME - cystoid macular edema. PED - pigment epithelial detachment. IRF - intraretinal fluid. SRF - subretinal fluid. EZ - ellipsoid zone. ERM - epiretinal membrane. ORA - outer retinal atrophy. ORT - outer retinal tubulation. SRHM - subretinal hyper-reflective material                  ASSESSMENT/PLAN:    ICD-10-CM   1. Posterior vitreous detachment of both eyes  H43.813   2. Retinal edema  H35.81 OCT, Retina - OU - Both Eyes  3. Essential hypertension  I10   4. Hypertensive retinopathy of both eyes  H35.033   5. Pseudophakia of both eyes  Z96.1     1.  PVD / vitreous syneresis OU  - pt presented acutely on 08.21.21 for new onset, symptomatic flashes and floaters OS for over a week  - Chronic symptomatic floaters OD  - Onset of PVD OD June 2020 -- diagnosed at [facility]  - Exam shows prominent Weiss ring OD, OS with significant vit condensations  - BCVA 20/25 OU -- improved from 20/30 OU  - No  RT or RD on 360 scleral depressed exam  - Reviewed s/s of RT/RD  - Strict return precautions for any such RT/RD signs/symptoms  - pt is cleared from a retina standpoint for release to Dr. Leron Croak and resumption of primary eye care  2. No retinal edema on exam or OCT  3,4. Hypertensive retinopathy OU  - discussed importance of tight BP control  - monitor  5. Pseudophakia OU  - beautiful surgeries  - doing well  Ophthalmic Meds Ordered this visit:  No orders of the defined types were placed in this encounter.      Return if symptoms worsen or fail to  improve.  There are no Patient Instructions on file for this visit.   Explained the diagnoses, plan, and follow up with the patient and they expressed understanding.  Patient expressed understanding of the importance of proper follow up care.   This document serves as a record of services personally performed by Karie Chimera, MD, PhD. It was created on their behalf by Annalee Genta, COMT. The creation of this record is the provider's dictation and/or activities during the visit.  Electronically signed by: Annalee Genta, COMT 06/13/20 10:17 PM  This document serves as a record of services personally performed by Karie Chimera, MD, PhD. It was created on their behalf by Glee Arvin. Manson Passey, OA an ophthalmic technician. The creation of this record is the provider's dictation and/or activities during the visit.    Electronically signed by: Glee Arvin. Manson Passey, [place] 09.22.2021 10:17 PM   Karie Chimera, M.D., Ph.D. Diseases & Surgery of the Retina and Vitreous [facility]  I have reviewed the above documentation for accuracy and completeness, and I agree with the above. Karie Chimera, M.D., Ph.D. 06/13/20 10:20 PM   Abbreviations: M myopia (nearsighted); A astigmatism; H hyperopia (farsighted); P presbyopia; Mrx spectacle prescription;  CTL contact lenses; OD right eye; OS left eye; OU both eyes  XT exotropia; ET esotropia; PEK punctate epithelial keratitis; PEE punctate epithelial erosions; DES dry eye syndrome; MGD meibomian gland dysfunction; ATs artificial tears; PFAT's preservative free artificial tears; NSC nuclear sclerotic cataract; PSC posterior subcapsular cataract; ERM epi-retinal membrane; PVD posterior vitreous detachment; RD retinal detachment; DM diabetes mellitus; DR diabetic retinopathy; NPDR non-proliferative diabetic retinopathy; PDR proliferative diabetic retinopathy; CSME clinically significant macular edema; DME diabetic macular edema; dbh dot blot  hemorrhages; CWS cotton wool spot; POAG primary open angle glaucoma; C/D cup-to-disc ratio; HVF humphrey visual field; GVF goldmann visual field; OCT optical coherence tomography; IOP intraocular pressure; BRVO Branch retinal vein occlusion; CRVO central retinal vein occlusion; CRAO central retinal artery occlusion; BRAO branch retinal artery occlusion; RT retinal tear; SB scleral buckle; PPV pars plana vitrectomy; VH Vitreous hemorrhage; PRP panretinal laser photocoagulation; IVK intravitreal kenalog; VMT vitreomacular traction; MH Macular hole;  NVD neovascularization of the disc; NVE neovascularization elsewhere; AREDS age related eye disease study; ARMD age related macular degeneration; POAG primary open angle glaucoma; EBMD epithelial/anterior basement membrane dystrophy; ACIOL anterior chamber intraocular lens; IOL intraocular lens; PCIOL posterior chamber intraocular lens; Phaco/IOL phacoemulsification with intraocular lens placement; PRK photorefractive keratectomy; LASIK laser assisted in situ keratomileusis; HTN hypertension; DM diabetes mellitus; COPD chronic obstructive pulmonary disease

## 2020-06-13 ENCOUNTER — Other Ambulatory Visit: Payer: Self-pay

## 2020-06-13 ENCOUNTER — Encounter (INDEPENDENT_AMBULATORY_CARE_PROVIDER_SITE_OTHER): Payer: Self-pay | Admitting: Ophthalmology

## 2020-06-13 ENCOUNTER — Ambulatory Visit (INDEPENDENT_AMBULATORY_CARE_PROVIDER_SITE_OTHER): Payer: Medicaid Other | Admitting: Ophthalmology

## 2020-06-13 DIAGNOSIS — I10 Essential (primary) hypertension: Secondary | ICD-10-CM

## 2020-06-13 DIAGNOSIS — H3581 Retinal edema: Secondary | ICD-10-CM

## 2020-06-13 DIAGNOSIS — Z961 Presence of intraocular lens: Secondary | ICD-10-CM

## 2020-06-13 DIAGNOSIS — H35033 Hypertensive retinopathy, bilateral: Secondary | ICD-10-CM

## 2020-06-13 DIAGNOSIS — H43813 Vitreous degeneration, bilateral: Secondary | ICD-10-CM | POA: Diagnosis not present

## 2020-06-28 ENCOUNTER — Other Ambulatory Visit: Payer: Self-pay | Admitting: Physician Assistant

## 2020-06-28 DIAGNOSIS — Z1231 Encounter for screening mammogram for malignant neoplasm of breast: Secondary | ICD-10-CM

## 2020-07-16 ENCOUNTER — Ambulatory Visit: Payer: Medicaid Other | Admitting: Neurology

## 2020-08-23 ENCOUNTER — Other Ambulatory Visit: Payer: Self-pay

## 2020-08-23 ENCOUNTER — Ambulatory Visit
Admission: RE | Admit: 2020-08-23 | Discharge: 2020-08-23 | Disposition: A | Discharge status: Home / Self Care | Payer: Medicaid Other | Source: Ambulatory Visit | Attending: Physician Assistant | Admitting: Physician Assistant

## 2020-08-23 DIAGNOSIS — Z1231 Encounter for screening mammogram for malignant neoplasm of breast: Secondary | ICD-10-CM

## 2020-10-19 ENCOUNTER — Encounter: Payer: Self-pay | Admitting: Gastroenterology

## 2020-10-19 ENCOUNTER — Ambulatory Visit (INDEPENDENT_AMBULATORY_CARE_PROVIDER_SITE_OTHER): Payer: Medicaid Other | Admitting: Gastroenterology

## 2020-10-19 VITALS — BP 132/70 | HR 79 | Ht 66.0 in | Wt 136.0 lb

## 2020-10-19 DIAGNOSIS — R1012 Left upper quadrant pain: Secondary | ICD-10-CM

## 2020-10-19 DIAGNOSIS — K31A0 Gastric intestinal metaplasia, unspecified: Secondary | ICD-10-CM | POA: Diagnosis not present

## 2020-10-19 DIAGNOSIS — K219 Gastro-esophageal reflux disease without esophagitis: Secondary | ICD-10-CM | POA: Diagnosis not present

## 2020-10-19 DIAGNOSIS — K862 Cyst of pancreas: Secondary | ICD-10-CM

## 2020-10-19 MED ORDER — GABAPENTIN 300 MG PO CAPS: 300.0000 mg | ORAL_CAPSULE | Freq: Every day | ORAL | 3 refills | Status: DC

## 2020-10-19 NOTE — Progress Notes (Signed)
HPI :  65 year old female here for a follow-up visit for pancreatic cyst, abdominal discomfort, neuropathy.  She has had a benign appearing 1 cm pancreatic cyst noted since imaging in 2018.  She has had a few MRCP since that time which have shown stability.  Her last MRCP was performed in January 2021 and radiology had recommended a follow-up MRCP 1 year later to document stability.  She states she feels about the same since have last seen her.  She continues to have some discomfort in her upper abdomen to left upper quadrant.  She states this can come and go but has some tenderness in her left side all the time.  Sometimes she feels this a bit more so after she eats.  Denies any dysphagia, denies much of any heartburn.  Has some occasional early satiety with bloating.  She has been tried on some Protonix once daily and that controls her reflux but continues to have some dyspepsia on this dose.  She uses Rolaids as needed which can provide some benefit.  No family history of gastric cancer.  I had referred her to neurology after our last visit.  She states she has had chronic left upper quadrant pain since her neck surgery.  She also had pain that radiated down her flank into her back and has numbness in her fingers and toes on the left side.  A component of her discomfort never goes away and is present all the time.  I previously gave her a trial of gabapentin but she cannot recall if it helped and stopped it.  We had discussed potentially increasing it but she never did so.  She inquires about trying that again today.  She has had a prior extensive workup by [facility] as outlined below:  [name] - 05/05/17 - 1.3cm gallstone, 0.2cm polyp, normal exam otherwise CT scan abdomen - 05/27/17 - gallstones, 62mm pancreatic cyst in PD, 23mm cyst in tail, diffuse atherosclerosis  EGD 03/20/17 - mild esophagitis, gastritis, normal duodenum - biopsies taken to rule out H pylori - path shows no H pylori,but findings  include incidental finding ofgastric intestinal metaplasia, normal esophageal biopsies Colonoscopy 03/19/17 - normal  Labs \- done 06/21/19 - normal renal function, LFTs entirely normal, lipids normal.   MRCP 09/29/2019 - IMPRESSION: 1. Stable 9 mm cystic lesion in the body of the pancreas. No signs of main duct dilation or enhancement. Recommend follow up pre and post contrast MRI/MRCP or pancreatic protocol CT in 1 year, this should continue for a total of 5 years from the initial imaging study. This recommendation follows ACR consensus guidelines: Management of Incidental Pancreatic Cysts: A White Paper of the ACR Incidental Findings Committee. J Am Coll Radiol 2017;14:911-923. 2. Stable small splenic cyst. 3. Atherosclerotic changes throughout the abdominal aorta.    Past Medical History:  Diagnosis Date  . Arthritis    "joints; fingers; shoulders; knees; back" (08/27/2012)  . Chronic back pain    epidural injections  . Chronic neck pain    myelopathy  . COPD (chronic obstructive pulmonary disease) (HCC)   . Fibromyalgia    "dx'd long time ago; before they really knew what it was" (112/02/2012)  . Gastric ulcer   . GERD (gastroesophageal reflux disease)    takes Protonix daily  . Heart murmur   . History of bronchitis several yrs ago  . History of colon polyps   . Hyperlipidemia    takes Lovastatin daily  . Hypertension    takes  Losartan daily  . Hypertensive retinopathy    OU  . Joint swelling   . Osteoporosis   . Palpitations    takes Atenolol daily  . Pneumonia 2013   hx of  . Prediabetes   . Scoliosis   . Sleep apnea    "have a mask but I don't wear it" (08/27/2012)  . Weakness    numbness in hands and feet     Past Surgical History:  Procedure Laterality Date  . ANTERIOR CERVICAL DECOMP/DISCECTOMY FUSION N/A 07/16/2016   Procedure: ANTERIOR CERVICAL DECOMPRESSION FUSION CERVICAL 3-4, CERVICAL 4-5, CERVICAL 5-6 WITH INSTRUMENATION AND ALLOGRAFT;  Surgeon:  Estill Bamberg, MD;  Location: [facility] OR;  Service: Orthopedics;  Laterality: N/A;  ANTERIOR CERVICAL DECOMPRESSION FUSION CERVICAL 3-4, CERVICAL 4-5, CERVICAL 5-6 WITH INSTRUMENATION AND ALLOGRAFT  . CATARACT EXTRACTION Right 05/18/2017   Dr. Marcell Anger  . CATARACT EXTRACTION Left uknown  . CATARACT EXTRACTION, BILATERAL  2018  . COLONOSCOPY    . ESOPHAGOGASTRODUODENOSCOPY    . EYE SURGERY Bilateral    Cat Sx  . fatty tumor removed from right leg     as a teenager  . HIP ARTHROPLASTY  08/27/2012   Procedure: ARTHROPLASTY BIPOLAR HIP;  Surgeon: Velna Ochs, MD;  Location: [facility] OR;  Service: Orthopedics;  Laterality: Left;   Family History  Problem Relation Age of Onset  . Throat cancer Mother   . Stroke Sister   . Seizures Sister   . Dementia Sister   . Lung cancer Brother   . Stroke Sister   . Prostate cancer Father   . Hypertension Father   . Breast cancer Maternal Grandmother   . Diabetes Other   . Thyroid disease Neg Hx    Social History   Tobacco Use  . Smoking status: Current Every Day Smoker    Packs/day: 0.50    Years: 30.00    Pack years: 15.00    Types: Cigarettes  . Smokeless tobacco: Never Used  Vaping Use  . Vaping Use: Never used  Substance Use Topics  . Alcohol use: No  . Drug use: No   Current Outpatient Medications  Medication Sig Dispense Refill  . acetaminophen (TYLENOL) 500 MG tablet Take 500 mg by mouth every 6 (six) hours as needed.    Marland Kitchen amLODipine (NORVASC) 10 MG tablet Take 10 mg by mouth daily.    . cetirizine (ZYRTEC) 10 MG tablet Take 10 mg by mouth daily.    . Cholecalciferol (VITAMIN D3) 1.25 MG (50000 UT) TABS Take by mouth 2 (two) times daily.    . famotidine (PEPCID) 20 MG tablet Take 1 tablet (20 mg total) by mouth 2 (two) times daily as needed. 180 tablet 1  . fluticasone (FLONASE) 50 MCG/ACT nasal spray Place 2 sprays into both nostrils daily.    Marland Kitchen lovastatin (MEVACOR) 20 MG tablet Take 20 mg by mouth at bedtime.    . pantoprazole  (PROTONIX) 20 MG tablet Take 20 mg by mouth daily.    . valsartan (DIOVAN) 160 MG tablet Take 160 mg by mouth daily. for high blood pressure  1   No current facility-administered medications for this visit.   Allergies  Allergen Reactions  . Hydrocodone-Acetaminophen Palpitations and Other (See Comments)    Other reaction(s): Dizziness  chest pain  . Tramadol Other (See Comments)    "It makes my chest feel funny; I don't like to take that"  . Cortizone-10 [Hydrocortisone]     Shots- Headaches   . Vicodin [  Hydrocodone-Acetaminophen]   . Lisinopril Other (See Comments)    cough     Review of Systems: All systems reviewed and negative except where noted in HPI.   Lab Results  Component Value Date   WBC 6.8 02/26/2017   HGB 13.6 02/26/2017   HCT 41.6 02/26/2017   MCV 98.1 02/26/2017   PLT 205 02/26/2017    Lab Results  Component Value Date   CREATININE 0.95 08/18/2018   BUN 15 08/18/2018   NA 137 08/18/2018   K 3.8 08/18/2018   CL 104 08/18/2018   CO2 28 08/18/2018      Physical Exam: BP 132/70   Pulse 79   Ht 5\' 6"  (1.676 m)   Wt 136 lb (61.7 kg)   SpO2 98%   BMI 21.95 kg/m  Constitutional: Pleasant,well-developed, female in no acute distress. Abdominal: Soft, nondistended, mild tenderness to LUQ at costal margin. There are no masses palpable. No hepatomegaly. Extremities: no edema Lymphadenopathy: No cervical adenopathy noted. Neurological: Alert and oriented to person place and time. Skin: Skin is warm and dry. No rashes noted. Psychiatric: Normal mood and affect. Behavior is normal.   ASSESSMENT AND PLAN: 65 year old female here for reassessment of the following:  Pancreatic cyst Left upper quadrant abdominal pain GERD Gastric intestinal metaplasia  As above, stable appearing 1 cm pancreatic cyst.  Discussed what this is and that it appears benign at this time but radiology is recommending ongoing surveillance which is reasonable with MRCP.  She  has claustrophobia with closed MRI, requesting open MRI for this exam, we will try to accommodate this for her.  I do not think that this finding is related to her pain as she endorses it today.  She has chronic left-sided neuropathic symptoms from her left upper extremity to left lower extremity and feels chronic discomfort in her left upper abdomen, she also feels that this pain is likely related.  She has had imaging and endoscopy which has not shown a clear cause otherwise.  She does however have some slight postprandial component.  We discussed options previously and I offered to put her on some gabapentin to see if this will help this discomfort as well as the rest of her neuropathic symptoms.  States she states she did not complete a good trial of this and wants to try it again, will place her on 300 mg gabapentin nightly.  Otherwise she can try increasing her Protonix to 20 mg twice daily for 1 month trial to see if that provides any benefit in light of her postprandial component and I also provided her a sample of FD guard to treat component of dyspepsia and see if that will help her at all.  She agreed with the plan, will await her course on the regimen as outlined and her MRCP findings.  Of note, she does have a history of gastric intestinal metaplasia noted on prior EGD.  I discussed that this can theoretically increase the risk for gastric cancer although the risk is quite low.  She has no family history of stomach cancer, do not feel strongly that she warrants surveillance right now for this but we can reconsider this pending her course.  She agreed.  Plan: - Open MRCP to evaluate pancreatic cyst - Increase Protonix to twice daily for 1 month trial - Sample of FD guard provided to treat possible component of dyspepsia - Gabapentin 300 milligrams nightly for 1 month with refills to treat her neuropathy, she can follow-up with her  primary care for that - Consideration for surveillance EGD at some  point time for GIM, will hold off on that right now.  Ileene Patrick, MD [facility]

## 2020-10-19 NOTE — Patient Instructions (Addendum)
If you are age 65 or older, your body mass index should be between 23-30. Your Body mass index is 21.95 kg/m. If this is out of the aforementioned range listed, please consider follow up with your Primary Care Provider.  If you are age 70 or younger, your body mass index should be between 19-25. Your Body mass index is 21.95 kg/m. If this is out of the aformentioned range listed, please consider follow up with your Primary Care Provider.    You have been scheduled for an MR/MRCP at [facility], located at [address] . Your appointment is scheduled for Friday, 11-09-20 at 1:50pm. Please arrive 30 minutes prior to your appointment time for registration purposes. Please make certain not to have anything to eat or drink 6 hours prior to your test. In addition, if you have any metal in your body, have a pacemaker or defibrillator, please be sure to let your ordering physician know. This test typically takes 45 minutes to 1 hour to complete. Should you need to reschedule, please call [phone] to do so. Wear cotton clothing. Do not wear any jewelery, or metal in her clothing.  ____________________________________________________________________  Please increase your Protonix 20 mg to twice a day for 1 month.  We have given you samples of the following medication to take: FDgard - take as directed as needed  We have sent the following medications to your pharmacy for you to pick up at your convenience: Gabapentin 300 mg: Take daily at bedtime   Thank you for entrusting me with your care and for choosing [facility], Dr. Ileene Patrick   ]

## 2020-11-06 ENCOUNTER — Other Ambulatory Visit: Payer: Medicaid Other

## 2020-11-09 ENCOUNTER — Other Ambulatory Visit: Payer: Self-pay

## 2020-11-09 ENCOUNTER — Ambulatory Visit
Admission: RE | Admit: 2020-11-09 | Discharge: 2020-11-09 | Disposition: A | Discharge status: Home / Self Care | Payer: Medicaid Other | Source: Ambulatory Visit | Attending: Gastroenterology | Admitting: Gastroenterology

## 2020-11-09 DIAGNOSIS — R9389 Abnormal findings on diagnostic imaging of other specified body structures: Secondary | ICD-10-CM

## 2020-11-09 DIAGNOSIS — K862 Cyst of pancreas: Secondary | ICD-10-CM

## 2020-11-09 MED ORDER — GADOBENATE DIMEGLUMINE 529 MG/ML IV SOLN
13.0000 mL | Freq: Once | INTRAVENOUS | Status: AC | PRN
  Administered 2020-11-09: 13 mL via INTRAVENOUS

## 2020-11-15 ENCOUNTER — Other Ambulatory Visit: Payer: Self-pay | Admitting: Gastroenterology

## 2020-11-16 ENCOUNTER — Encounter: Payer: Self-pay | Admitting: Neurology

## 2020-11-16 ENCOUNTER — Other Ambulatory Visit: Payer: Self-pay

## 2020-11-16 ENCOUNTER — Ambulatory Visit: Payer: Medicaid Other | Admitting: Neurology

## 2020-11-16 VITALS — BP 134/72 | HR 90 | Ht 66.0 in | Wt 136.0 lb

## 2020-11-16 DIAGNOSIS — Z9889 Other specified postprocedural states: Secondary | ICD-10-CM

## 2020-11-16 DIAGNOSIS — G959 Disease of spinal cord, unspecified: Secondary | ICD-10-CM

## 2020-11-16 DIAGNOSIS — M5000 Cervical disc disorder with myelopathy, unspecified cervical region: Secondary | ICD-10-CM | POA: Diagnosis not present

## 2020-11-16 MED ORDER — TIZANIDINE HCL 2 MG PO TABS: 2.0000 mg | ORAL_TABLET | Freq: Every evening | ORAL | 3 refills | Status: DC | PRN

## 2020-11-16 MED ORDER — GABAPENTIN 300 MG PO CAPS: 300.0000 mg | ORAL_CAPSULE | Freq: Two times a day (BID) | ORAL | 1 refills | Status: DC

## 2020-11-16 NOTE — Patient Instructions (Signed)
Increase gabapentin to 300mg  twice daily  For your cramps/spasms, take tizanidine 2mg  as needed  Return to clinic in 6 months

## 2020-11-16 NOTE — Progress Notes (Signed)
Follow-up Visit   Date: 11/16/20   Tina [name] MRN: [id number] DOB: 1956/02/12   Interim History: Tina [name] is a 64 y.o. 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  The patient was accompanied to the clinic by self.  History of present illness: Starting in 2017, she began having numbness over the fingers, hands, abdomen, and legs, worse on the left side.  She also has some weakness in the hands and imbalance.  She walks unassisted and has not had any falls.  She had MRI cervical spine in 2017 which showed severe cervical canal stenosis with multilevel impingement worse at C3-5 with associated myelomalacia and underwent ACDF at C3-4, C4-5, C5-6 by Dr. Yevette Edwards.  Following surgery, the intensity of her numbness and tingling significantly improved, but did not completely resolve.  She continues to have numbness over the hands and legs, which is worse on the left side.  She occasionally has tingling and pain in the hands, worse during the winter months.  She has tried gabapentin which did not provide relief.   She is on disability since 2010 for her back.  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    UPDATE 11/16/2020:  She is here for 6 month follow-up.  She continues to have numbness in the hands, which is unchanged.  She has spells where her hands will feel frozen and cold.  She denies weakness and falls.  Her gastroenterologist started her on gabapentin 300mg  at bedtime and she has not noticed any change.   Medications:  Current Outpatient Medications on File Prior to Visit  Medication Sig Dispense Refill  . acetaminophen (TYLENOL) 500 MG tablet Take 500 mg by mouth every 6 (six) hours as needed.    alendronate (FOSAMAX) 70 MG tablet Take 70 mg by mouth once a week. Take with a full glass of water on an empty stomach.     Marland Kitchen amLODipine (NORVASC) 10 MG tablet Take 10 mg by mouth daily.    . cetirizine (ZYRTEC) 10 MG tablet Take 10 mg by mouth daily.    . cholecalciferol (VITAMIN D) 25 MCG (1000 UNIT) tablet Take by mouth 2 (two) times daily.    . famotidine (PEPCID) 20 MG tablet TAKE 1 TABLET (20 MG TOTAL) BY MOUTH 2 (TWO) TIMES DAILY AS NEEDED. 180 tablet 1  . fluticasone (FLONASE) 50 MCG/ACT nasal spray Place 2 sprays into both nostrils daily.    Marland Kitchen gabapentin (NEURONTIN) 300 MG capsule Take 1 capsule (300 mg total) by mouth at bedtime. 30 capsule 3  . lovastatin (MEVACOR) 20 MG tablet Take 20 mg by mouth at bedtime.    . pantoprazole (PROTONIX) 20 MG tablet Take 20 mg by mouth 2 (two) times daily.    . valsartan (DIOVAN) 160 MG tablet Take 160 mg by mouth daily. for high blood pressure  1   No current facility-administered medications on file prior to visit.    Allergies:  Allergies  Allergen Reactions  . Hydrocodone-Acetaminophen Palpitations and Other (See Comments)    Other reaction(s): Dizziness  chest pain  . Tramadol Other (See Comments)    "It makes my chest feel funny; I don't like to take that"  . Cortizone-10 [Hydrocortisone]     Shots- Headaches   . Vicodin [Hydrocodone-Acetaminophen]   . Lisinopril Other (See Comments)    cough    Vital Signs:  BP 134/72   Pulse 90   Ht 5\' 6"  (1.676 m)   Wt 136 lb (61.7 kg)   SpO2 99%   BMI 21.95 kg/m   Neurological Exam: MENTAL STATUS including orientation to time, place, person, recent and remote memory, attention span and concentration, language, and fund of knowledge is normal.  Speech is not dysarthric.  CRANIAL NERVES:  No visual field defects.  Pupils equal round and reactive to light.  Normal conjugate, extra-ocular eye movements in all directions of gaze.  No ptosis.   MOTOR:  Motor strength is 5/5 in all extremities, except 5-/5 bilateral hands.  No atrophy, fasciculations or abnormal movements.  No pronator drift.  Tone is normal.     MSRs:  Reflexes are 3+/4 throughout except 2+/4 at the ankles.  SENSORY:  Intact to vibration throughout.  COORDINATION/GAIT:  Normal finger-to- nose-finger.  Intact rapid alternating movements bilaterally.  Gait narrow based and stable.   Data: MRI cervical spine wwo contrast 05/05/2020: 1. Prior ACDF at C3 through C6 without residual spinal stenosis. 2. Small central to right paracentral disc protrusion at C2-3 without significant spinal stenosis or cord deformity. 3. Mild disc bulge at C6-7 without stenosis or impingement. 4. Small focus of chronic myelomalacia at the level of C4-5.   IMPRESSION/PLAN: Cervical myelopathy s/p ACDF C3-6 with residual paresthesias.  Discussed that with cord myelomalacia, her paresthesias will be lasting deficits.  Management is symptomatic.   Fortunately, there is no new stenosis on MRI from 04/2020 which was reviewed with patient.   - Start gabapentin 300mg  BID   - Continue tizanidine 2mg  as needed for cramps  Return to clinic in 6 months   Thank you for allowing me to participate in patient's care.  If I can answer any additional questions, I would be pleased to do so.    Sincerely,    Malee Grays K. 05/2020, DO

## 2020-12-05 NOTE — Progress Notes (Signed)
Patient ID: Tina [name], female   DOB: 06/01/1956, 65 y.o.   MRN: [id number]          Reason for Appointment: Follow-up of thyroid    History of Present Illness:    The patient's thyroid abnormality was first discovered in 03/2018 She apparently was having a routine exam and screening TSH was low This was repeated and was low again at 0.22 by PCP  On her initial consultation in 11/19 TSH was only borderline at 0.34 She was found to have a right-sided thyroid enlargement Subsequently she had a thyroid scan in 08/2018  This did not show any abnormal uptake pattern in the thyroid  As before she has no new symptoms and only occasionally has tiredness, also has periodic palpitations or feeling of fast heart rate This is not new  Still has some cramping in the left neck area No shakiness, change in appetite or weight loss  Thyroid levels pending, has been normal the last 2 times  Lab Results  Component Value Date   TSH 0.46 12/01/2019   TSH 0.42 12/02/2018   TSH 0.34 (L) 07/29/2018   FREET4 0.89 12/01/2019   FREET4 0.93 12/02/2018   FREET4 0.95 07/29/2018       Allergies as of 12/06/2020      Reactions   Hydrocodone-acetaminophen Palpitations, Other (See Comments)   Other reaction(s): Dizziness  chest pain   Tramadol Other (See Comments)   "It makes my chest feel funny; I don't like to take that"   Cortizone-10 [hydrocortisone]    Shots- Headaches   Vicodin [hydrocodone-acetaminophen]    Lisinopril Other (See Comments)   cough      Medication List       Accurate as of December 06, 2020  1:05 PM. If you have any questions, ask your nurse or doctor.        acetaminophen 500 MG tablet Commonly known as: TYLENOL Take 500 mg by mouth every 6 (six) hours as needed.   alendronate 70 MG tablet Commonly known as: FOSAMAX Take 70 mg by mouth once a week. Take with a full glass of water on an empty stomach.   amLODipine 10 MG tablet Commonly known as:  NORVASC Take 10 mg by mouth daily.   cetirizine 10 MG tablet Commonly known as: ZYRTEC Take 10 mg by mouth daily.   cholecalciferol 25 MCG (1000 UNIT) tablet Commonly known as: VITAMIN D Take by mouth 2 (two) times daily.   famotidine 20 MG tablet Commonly known as: PEPCID TAKE 1 TABLET (20 MG TOTAL) BY MOUTH 2 (TWO) TIMES DAILY AS NEEDED.   fluticasone 50 MCG/ACT nasal spray Commonly known as: FLONASE Place 2 sprays into both nostrils daily.   gabapentin 300 MG capsule Commonly known as: Neurontin Take 1 capsule (300 mg total) by mouth 2 (two) times daily.   lovastatin 20 MG tablet Commonly known as: MEVACOR Take 20 mg by mouth at bedtime.   pantoprazole 20 MG tablet Commonly known as: PROTONIX Take 20 mg by mouth 2 (two) times daily.   tiZANidine 2 MG tablet Commonly known as: ZANAFLEX Take 1 tablet (2 mg total) by mouth at bedtime as needed for muscle spasms.   valsartan 160 MG tablet Commonly known as: DIOVAN Take 160 mg by mouth daily. for high blood pressure       Allergies:  Allergies  Allergen Reactions  . Hydrocodone-Acetaminophen Palpitations and Other (See Comments)    Other reaction(s): Dizziness  chest pain  . Tramadol  Other (See Comments)    "It makes my chest feel funny; I don't like to take that"  . Cortizone-10 [Hydrocortisone]     Shots- Headaches   . Vicodin [Hydrocodone-Acetaminophen]   . Lisinopril Other (See Comments)    cough    Past Medical History:  Diagnosis Date  . Arthritis    "joints; fingers; shoulders; knees; back" (08/27/2012)  . Chronic back pain    epidural injections  . Chronic neck pain    myelopathy  . COPD (chronic obstructive pulmonary disease) (HCC)   . Fibromyalgia    "dx'd long time ago; before they really knew what it was" (112/02/2012)  . Gastric ulcer   . GERD (gastroesophageal reflux disease)    takes Protonix daily  . Heart murmur   . History of bronchitis several yrs ago  . History of colon polyps    . Hyperlipidemia    takes Lovastatin daily  . Hypertension    takes Losartan daily  . Hypertensive retinopathy    OU  . Joint swelling   . Osteoporosis   . Palpitations    takes Atenolol daily  . Pneumonia 2013   hx of  . Prediabetes   . Scoliosis   . Sleep apnea    "have a mask but I don't wear it" (08/27/2012)  . Weakness    numbness in hands and feet    Past Surgical History:  Procedure Laterality Date  . ANTERIOR CERVICAL DECOMP/DISCECTOMY FUSION N/A 07/16/2016   Procedure: ANTERIOR CERVICAL DECOMPRESSION FUSION CERVICAL 3-4, CERVICAL 4-5, CERVICAL 5-6 WITH INSTRUMENATION AND ALLOGRAFT;  Surgeon: Estill Bamberg, MD;  Location: [facility] OR;  Service: Orthopedics;  Laterality: N/A;  ANTERIOR CERVICAL DECOMPRESSION FUSION CERVICAL 3-4, CERVICAL 4-5, CERVICAL 5-6 WITH INSTRUMENATION AND ALLOGRAFT  . CATARACT EXTRACTION Right 05/18/2017   Dr. Marcell Anger  . CATARACT EXTRACTION Left uknown  . CATARACT EXTRACTION, BILATERAL  2018  . COLONOSCOPY    . ESOPHAGOGASTRODUODENOSCOPY    . EYE SURGERY Bilateral    Cat Sx  . fatty tumor removed from right leg     as a teenager  . HIP ARTHROPLASTY  08/27/2012   Procedure: ARTHROPLASTY BIPOLAR HIP;  Surgeon: Velna Ochs, MD;  Location: [facility] OR;  Service: Orthopedics;  Laterality: Left;    Family History  Problem Relation Age of Onset  . Throat cancer Mother   . Stroke Sister   . Seizures Sister   . Dementia Sister   . Lung cancer Brother   . Stroke Sister   . Prostate cancer Father   . Hypertension Father   . Breast cancer Maternal Grandmother   . Diabetes Other   . Thyroid disease Neg Hx     Social History:  reports that she has been smoking cigarettes. She has a 15.00 pack-year smoking history. She has never used smokeless tobacco. She reports that she does not drink alcohol and does not use drugs.   Review of Systems  Wt Readings from Last 3 Encounters:  12/06/20 137 lb 9.6 oz (62.4 kg)  11/16/20 136 lb (61.7 kg)  10/19/20  136 lb (61.7 kg)     Examination:   BP 130/68 (BP Location: Left Arm, Patient Position: Sitting, Cuff Size: Normal)   Pulse 80   Resp 18   Ht 5\' 5"  (1.651 m)   Wt 137 lb 9.6 oz (62.4 kg)   SpO2 97%   BMI 22.90 kg/m    The thyroid is palpable only on the right side, about 1.5-2 times normal  and smooth, slightly firm, no nodules felt  Left lobe is not enlarged There is no lymphadenopathy.    Hands are not diaphoretic  Biceps reflexes normal No tremor   Assessment/Plan:  History of autonomous thyroid function with right lobe enlargement, normal thyroid scan in the past Currently no symptoms suggestive of hyperthyroidism  Right lobe still enlarged nearly 2 times normal, unchanged She appears euthyroid on exam  Thyroid levels will be checked today and if normal she will follow-up in 1 year again    Reather Littler 12/06/2020

## 2020-12-06 ENCOUNTER — Encounter: Payer: Self-pay | Admitting: Endocrinology

## 2020-12-06 ENCOUNTER — Other Ambulatory Visit: Payer: Self-pay

## 2020-12-06 ENCOUNTER — Ambulatory Visit (INDEPENDENT_AMBULATORY_CARE_PROVIDER_SITE_OTHER): Payer: Medicaid Other | Admitting: Endocrinology

## 2020-12-06 VITALS — BP 130/68 | HR 80 | Resp 18 | Ht 65.0 in | Wt 137.6 lb

## 2020-12-06 DIAGNOSIS — E049 Nontoxic goiter, unspecified: Secondary | ICD-10-CM | POA: Diagnosis not present

## 2020-12-06 LAB — TSH: TSH: 0.47 u[IU]/mL (ref 0.35–4.50)

## 2020-12-06 LAB — T4, FREE: Free T4: 0.96 ng/dL (ref 0.60–1.60)

## 2020-12-06 LAB — T3, FREE: T3, Free: 3.2 pg/mL (ref 2.3–4.2)

## 2020-12-06 NOTE — Addendum Note (Signed)
Addended by: Adline Mango I on: 12/06/2020 01:49 PM   Modules accepted: Orders

## 2020-12-10 ENCOUNTER — Telehealth: Payer: Self-pay

## 2020-12-10 NOTE — Telephone Encounter (Signed)
-----   Message from Reather Littler, MD sent at 12/10/2020  3:42 PM EDT ----- Please call to let patient know that the lab results are normal and no further action needed

## 2020-12-10 NOTE — Progress Notes (Signed)
Please call to let patient know that the lab results are normal and no further action needed

## 2020-12-10 NOTE — Telephone Encounter (Signed)
Left detailed message advising pt of lab results.

## 2021-02-20 HISTORY — PX: ROTATOR CUFF REPAIR: SHX139

## 2021-03-08 IMAGING — MG DIGITAL SCREENING BILAT W/ TOMO W/ CAD
8 series · 9 of 24 positions shown · non-contrast
Comparison: Previous exam(s).

CLINICAL DATA: Screening.

EXAM:
DIGITAL SCREENING BILATERAL MAMMOGRAM WITH TOMO AND CAD

[R MLO synth-2D]
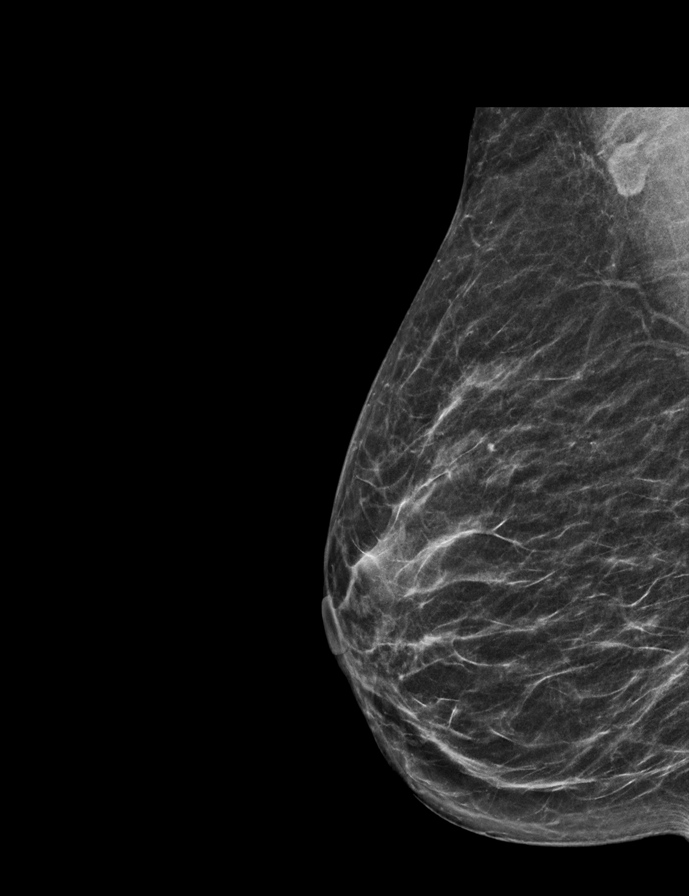

[L MLO synth-2D]
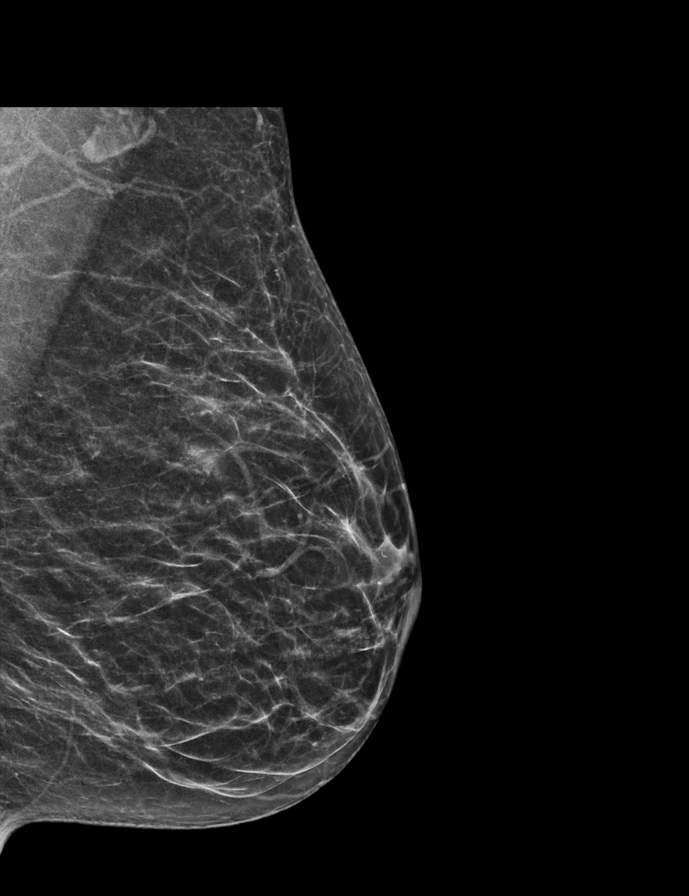

[L CC synth-2D]
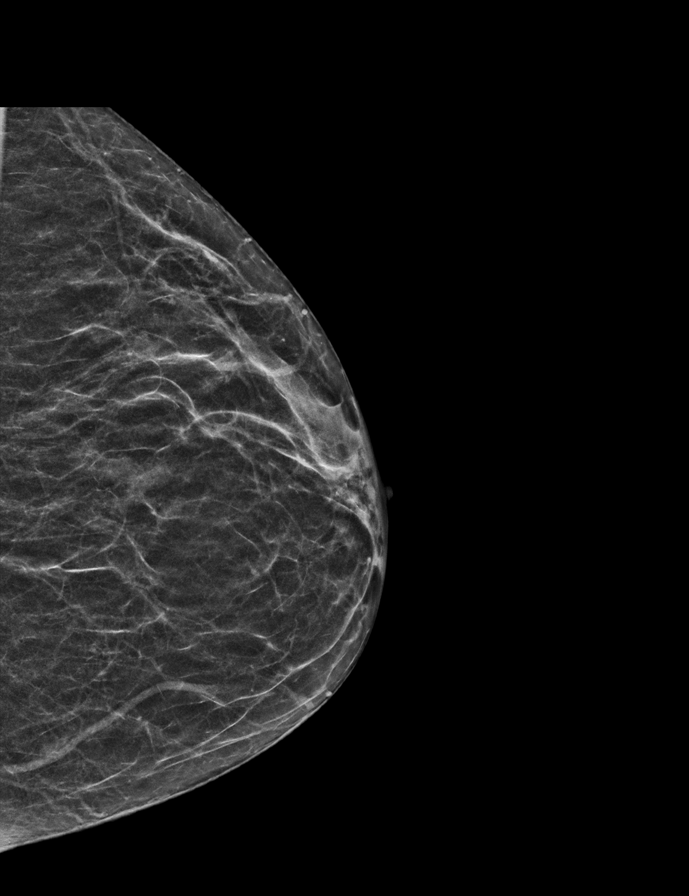

[R CC synth-2D]
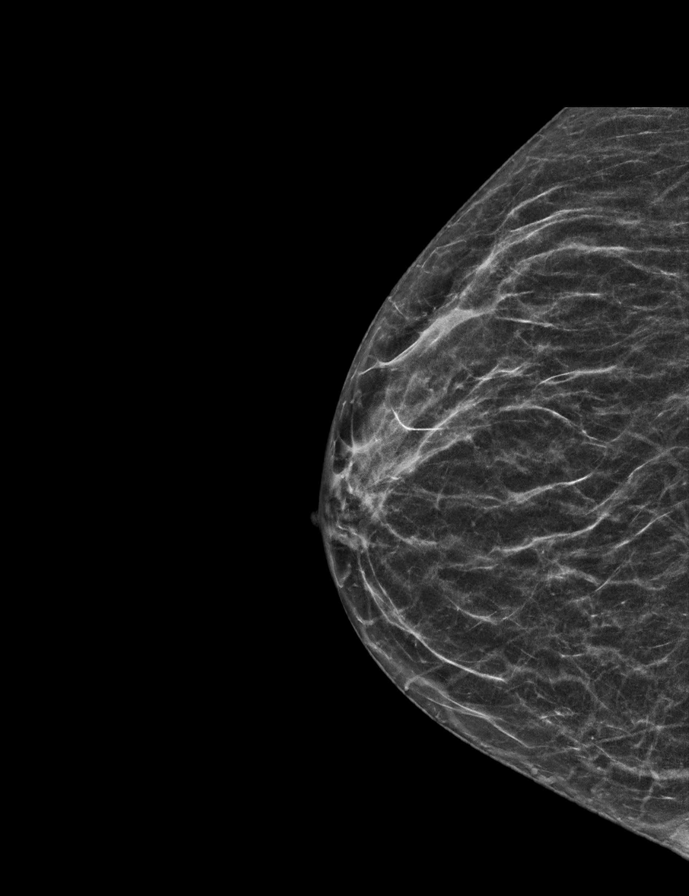

[R MLO tomo · 2 of 50 frames shown]
[frame 17/50]
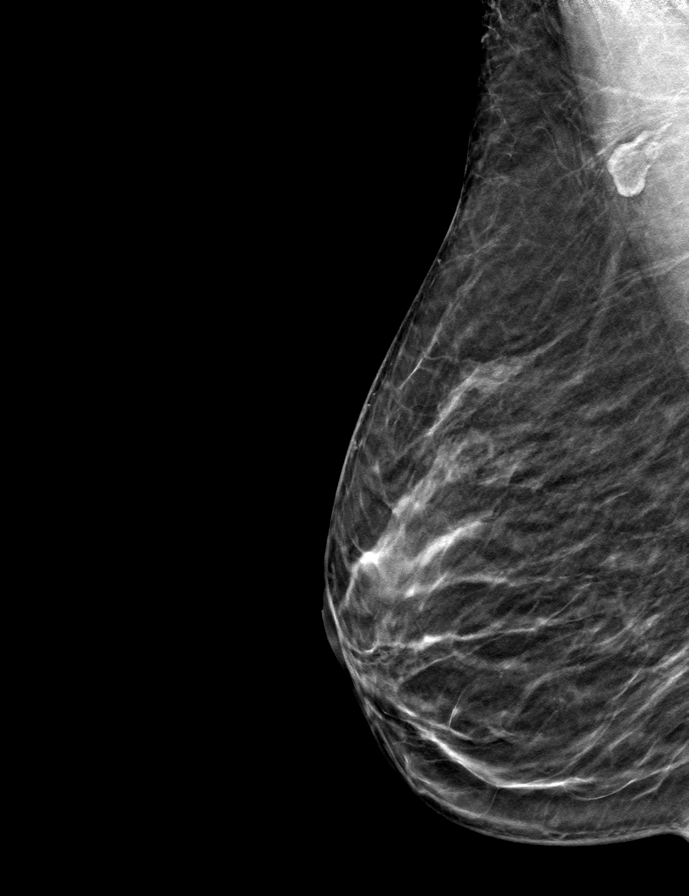
[frame 25/50]
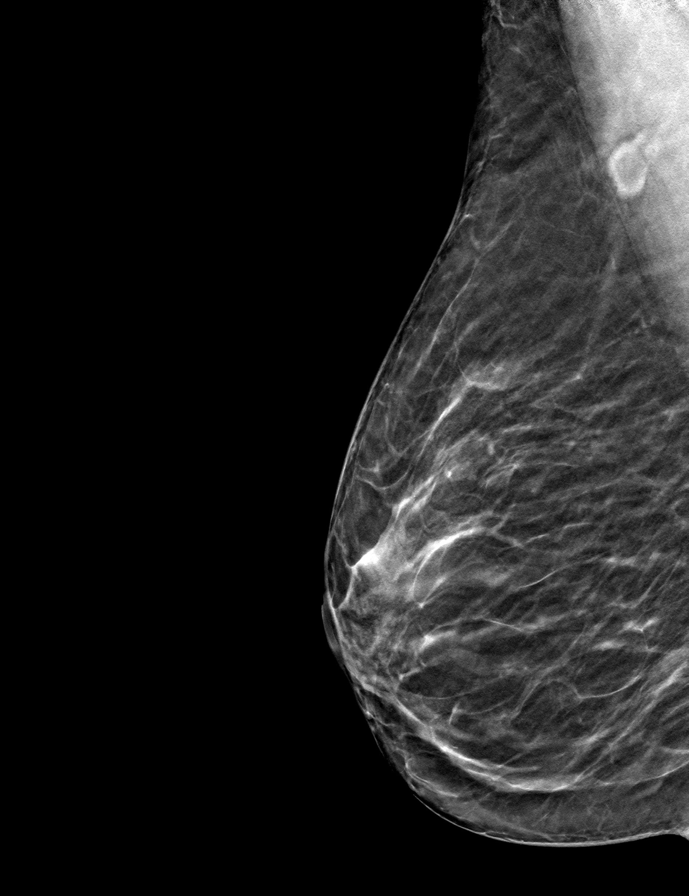

[L CC tomo · tomo slice 22/43.0]
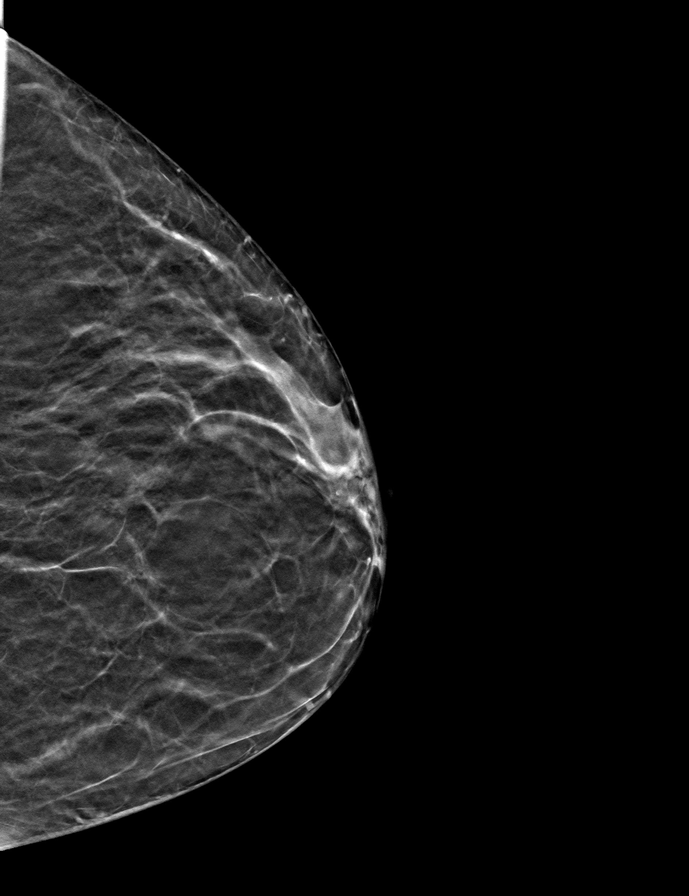

[L MLO tomo · tomo slice 25/50.0]
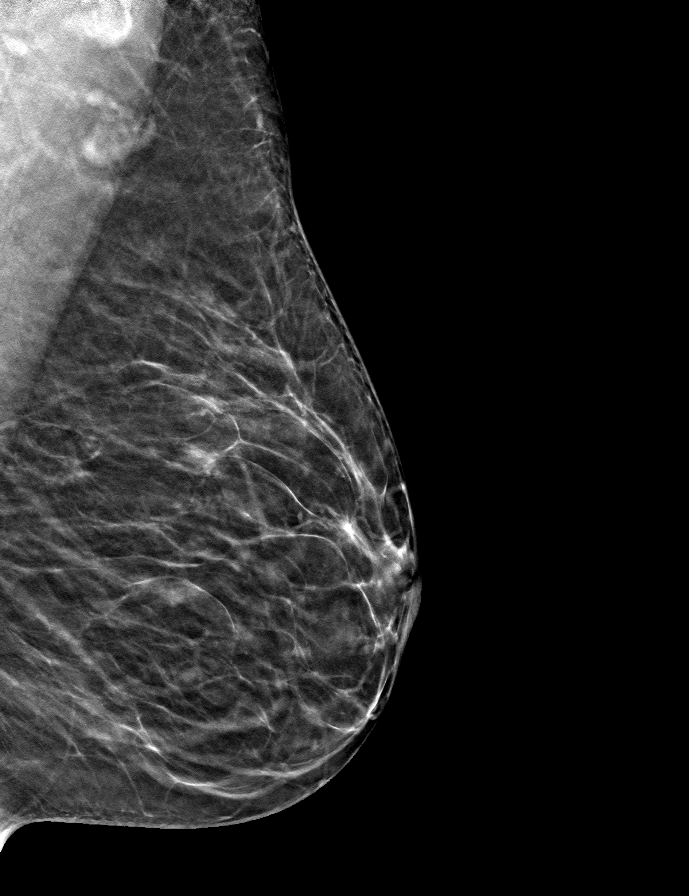

[R CC tomo · tomo slice 23/45.0]
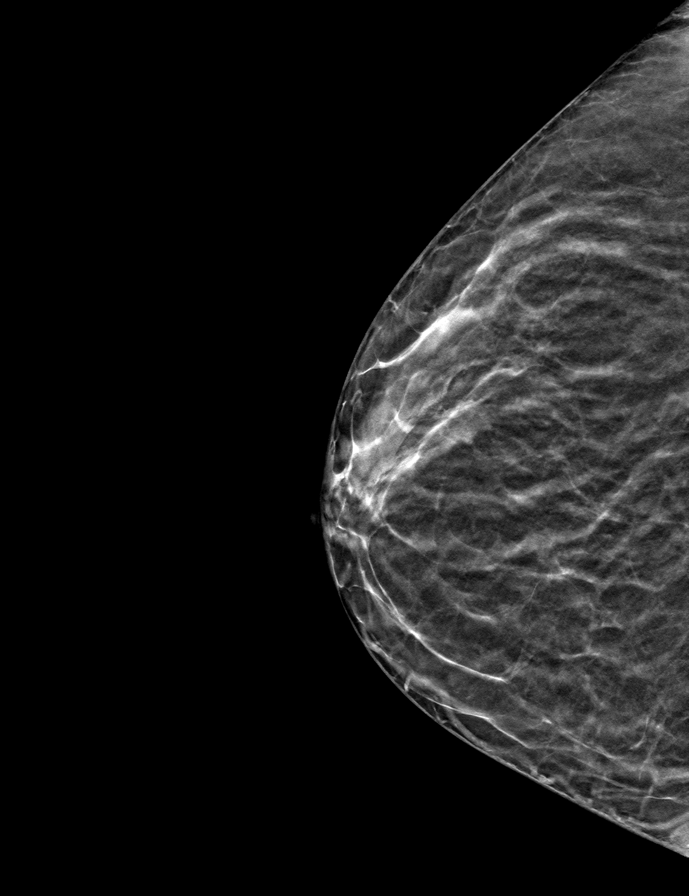

[9 of 24 positions shown; findings below may reference images not displayed]

ACR Breast Density Category b: There are scattered areas of
fibroglandular density.
FINDINGS: There are no findings suspicious for malignancy. Images were
processed with CAD.
IMPRESSION: No mammographic evidence of malignancy. A result letter of this
screening mammogram will be mailed directly to the patient.

RECOMMENDATION:
Screening mammogram in one year. (Code:[id number])

BI-RADS CATEGORY  1: Negative.

## 2021-05-23 ENCOUNTER — Other Ambulatory Visit: Payer: Self-pay | Admitting: Neurology

## 2021-05-24 NOTE — Telephone Encounter (Signed)
90-day supply sent, needs follow-up for additional refills.

## 2021-07-15 ENCOUNTER — Other Ambulatory Visit: Payer: Self-pay | Admitting: Physician Assistant

## 2021-07-15 DIAGNOSIS — Z1231 Encounter for screening mammogram for malignant neoplasm of breast: Secondary | ICD-10-CM

## 2021-08-26 ENCOUNTER — Ambulatory Visit
Admission: RE | Admit: 2021-08-26 | Discharge: 2021-08-26 | Disposition: A | Discharge status: Home / Self Care | Payer: Medicaid Other | Source: Ambulatory Visit | Attending: Physician Assistant | Admitting: Physician Assistant

## 2021-08-26 DIAGNOSIS — Z1231 Encounter for screening mammogram for malignant neoplasm of breast: Secondary | ICD-10-CM

## 2021-09-02 ENCOUNTER — Ambulatory Visit: Payer: Medicare Other | Attending: Urology | Admitting: Physical Therapy

## 2021-09-02 ENCOUNTER — Encounter: Payer: Self-pay | Admitting: Physical Therapy

## 2021-09-02 ENCOUNTER — Other Ambulatory Visit: Payer: Self-pay

## 2021-09-02 DIAGNOSIS — N3941 Urge incontinence: Secondary | ICD-10-CM | POA: Diagnosis present

## 2021-09-02 DIAGNOSIS — R278 Other lack of coordination: Secondary | ICD-10-CM | POA: Insufficient documentation

## 2021-09-02 DIAGNOSIS — M6281 Muscle weakness (generalized): Secondary | ICD-10-CM | POA: Diagnosis present

## 2021-09-02 NOTE — Therapy (Signed)
[facility] @ [facility] [address] [place], [place], [address] Phone: [phone]   Fax:  [phone]  Physical Therapy Evaluation  Patient Details  Name: Tina [name] MRN: [id number] Date of Birth: 08/24/1956 Referring Provider (PT): Dr. Kasandra Knudsen   Encounter Date: 09/02/2021   PT End of Session - 09/02/21 1424     Visit Number 1    Date for PT Re-Evaluation 11/25/21    Authorization Type Medicare and Medicaid    Authorization - Visit Number 1    Authorization - Number of Visits 10    PT Start Time 1400    PT Stop Time 1440    PT Time Calculation (min) 40 min    Activity Tolerance Patient tolerated treatment well    Behavior During Therapy [facility] for tasks assessed/performed             Past Medical History:  Diagnosis Date   Arthritis    "joints; fingers; shoulders; knees; back" (08/27/2012)   Chronic back pain    epidural injections   Chronic neck pain    myelopathy   COPD (chronic obstructive pulmonary disease) (HCC)    Fibromyalgia    "dx'd long time ago; before they really knew what it was" (112/02/2012)   Gastric ulcer    GERD (gastroesophageal reflux disease)    takes Protonix daily   Heart murmur    History of bronchitis several yrs ago   History of colon polyps    Hyperlipidemia    takes Lovastatin daily   Hypertension    takes Losartan daily   Hypertensive retinopathy    OU   Joint swelling    Osteoporosis    Palpitations    takes Atenolol daily   Pneumonia 2013   hx of   Prediabetes    Scoliosis    Sleep apnea    "have a mask but I don't wear it" (08/27/2012)   Weakness    numbness in hands and feet    Past Surgical History:  Procedure Laterality Date   ANTERIOR CERVICAL DECOMP/DISCECTOMY FUSION N/A 07/16/2016   Procedure: ANTERIOR CERVICAL DECOMPRESSION FUSION CERVICAL 3-4, CERVICAL 4-5, CERVICAL 5-6 WITH INSTRUMENATION AND ALLOGRAFT;  Surgeon: Estill Bamberg, MD;  Location: [facility]  OR;  Service: Orthopedics;  Laterality: N/A;  ANTERIOR CERVICAL DECOMPRESSION FUSION CERVICAL 3-4, CERVICAL 4-5, CERVICAL 5-6 WITH INSTRUMENATION AND ALLOGRAFT   CATARACT EXTRACTION Right 05/18/2017   Dr. Marcell Anger   CATARACT EXTRACTION Left uknown   CATARACT EXTRACTION, BILATERAL  2018   COLONOSCOPY     ESOPHAGOGASTRODUODENOSCOPY     EYE SURGERY Bilateral    Cat Sx   fatty tumor removed from right leg     as a teenager   HIP ARTHROPLASTY  08/27/2012   Procedure: ARTHROPLASTY BIPOLAR HIP;  Surgeon: Velna Ochs, MD;  Location: [facility] OR;  Service: Orthopedics;  Laterality: Left;    There were no vitals filed for this visit.    Subjective Assessment - 09/02/21 1409     Subjective Patient has to urinate when she has the urge or she will leak. Patient gets up 5-6 times per night. Patient urinates more when she lays down.    Patient Stated Goals reduce the amount of times she has to urinate at night.    Currently in Pain? No/denies    Multiple Pain Sites No                [facility] PT Assessment - 09/02/21 0001  Assessment   Medical Diagnosis urinary frequency, nocturia, urge incontinence, urinary urgency    Referring Provider (PT) Dr. Kasandra Knudsen    Onset Date/Surgical Date --   2 years   Prior Therapy none      Precautions   Precautions Other (comment)    Precaution Comments osteoporosis      Restrictions   Weight Bearing Restrictions No      Balance Screen   Has the patient fallen in the past 6 months No    Has the patient had a decrease in activity level because of a fear of falling?  No    Is the patient reluctant to leave their home because of a fear of falling?  No      Prior Function   Level of Independence Independent    Leisure none      Cognition   Overall Cognitive Status Within Functional Limits for tasks assessed      Posture/Postural Control   Posture/Postural Control No significant limitations      ROM / Strength   AROM / PROM / Strength  AROM;PROM;Strength      AROM   Overall AROM Comments full lumbar ROM                        Objective measurements completed on examination: See above findings.     Pelvic Floor Special Questions - 09/02/21 0001     Prior Pregnancies Yes    Number of Pregnancies 2    Urinary Leakage Yes    Pad use 1 pantyliner    Activities that cause leaking With strong urge;Lifting   waiting to urinate when she has the urge   Urinary urgency Yes    Urinary frequency urinate 5-6 times per night    Fecal incontinence No    Skin Integrity Other    Skin Integrity other dryness    Pelvic Floor Internal Exam Patient confirms identification and approves PT to assess pelvic flloor and treatment    Exam Type Vaginal    Palpation no tenderness    Strength --   3/5 posteriorly; 2/5 anteriorly, 1/5 laterally   Strength # of seconds 5              [facility] Adult PT Treatment/Exercise - 09/02/21 0001       Self-Care   Self-Care Other Self-Care Comments    Other Self-Care Comments  educated patient on the urge to void; education on vaginal moisturizers; stopping drinking water after 7 PM                     PT Education - 09/02/21 1440     Education Details educated patient on vaginal moisturizers, educated patient on urge to void; stop drinking water after 7    Person(s) Educated Patient    Methods Explanation;Demonstration;Handout    Comprehension Verbalized understanding;Returned demonstration              PT Short Term Goals - 09/02/21 1451       PT SHORT TERM GOAL #1   Title Patient independent with initial HEP for pelvic floor strengthening    Time 4    Period Weeks    Status New    Target Date 09/30/21      PT SHORT TERM GOAL #2   Title understand how to perfrom urge to void exercises to reduce the amount of time she has to urinate at night  Time 4    Period Weeks    Status New    Target Date 09/30/21               PT Long Term Goals -  09/02/21 1452       PT LONG TERM GOAL #1   Title Independent with advanced HEP for pelvic floor strength    Time 12    Period Weeks    Status New    Target Date 11/25/21      PT LONG TERM GOAL #2   Title able to delay the urge to void at night so she is urinating 2 times per night    Time 12    Period Weeks    Status New    Target Date 11/25/21      PT LONG TERM GOAL #3   Title able to walk to the bathroom when she has the urge to urinate and not leak urine due to circular pelvic floor contraction at strength >/= 3/5    Time 12    Period Weeks    Status New    Target Date 11/25/21      PT LONG TERM GOAL #4   Title ability to lift items without urinary leakage due to improved pelvic floor strength >/= 3/5    Time 12    Period Weeks    Status New    Target Date 11/25/21                    Plan - 09/02/21 1425     Clinical Impression Statement Patient is a 65 year old female wtih urinary frequency, nocturia, urinary urgency and urge incontinence for the past 2 years. Patient reports she urinates 5-6 times at night and have trouble getting sleep. Patient will wear 1 pantyliner per day. She will leak when she has the urge to urinate, bending forward, lifting and standing. Pelvic floor strength posteriorly is 3/5, laterally is 2/5 and anteriorly is 2/5. Patient is able to hold a contraction for 5 seconds. She has tigthness along the International aid/development worker. Patient will benefit from skilled therapy to reduce urinray frequency and urgency.    Personal Factors and Comorbidities Comorbidity 2;Fitness    Comorbidities cervical fusion; osteoporosis    Examination-Activity Limitations Continence;Locomotion Level;Bed Mobility;Stand    Stability/Clinical Decision Making Stable/Uncomplicated    Clinical Decision Making Low    Rehab Potential Excellent    PT Frequency 1x / week    PT Duration 12 weeks    PT Treatment/Interventions ADLs/Self Care Home Management;Biofeedback;Therapeutic  activities;Therapeutic exercise;Neuromuscular re-education;Patient/family education;Manual techniques    PT Next Visit Plan go over the urge to void; see if she is using something to increase vaginal moisture, pelvic floor exercises in supine and sitting,    Consulted and Agree with Plan of Care Patient             Patient will benefit from skilled therapeutic intervention in order to improve the following deficits and impairments:  Decreased coordination, Decreased endurance, Decreased strength, Decreased activity tolerance  Visit Diagnosis: Other lack of coordination - Plan: PT plan of care cert/re-cert  Muscle weakness (generalized) - Plan: PT plan of care cert/re-cert  Urge incontinence - Plan: PT plan of care cert/re-cert     Problem List Patient Active Problem List   Diagnosis Date Noted   Cervical disc disease with myelopathy 07/16/2016   Benign essential HTN 12/13/2015   Pneumococcal vaccine refused 01/09/2015   Depression screening 11/23/2013  Hypomagnesemia 04/06/2013   Osteoporosis 04/06/2013   Asthma 02/17/2013   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   B-complex deficiency 02/17/2013   Diverticulosis of colon 02/17/2013   Dysphagia 02/17/2013   Internal hemorrhoids with complication 02/17/2013   Low back pain 02/17/2013   Neck pain 02/17/2013   Obstructive sleep apnea 02/17/2013   Osteoarthrosis 02/17/2013   Pernicious anemia 02/17/2013   Femoral fracture (HCC) 08/27/2012   Tobacco abuse 08/27/2012   Diabetes (HCC) 08/27/2012   Hyperlipidemia 08/27/2012   PAF (paroxysmal atrial fibrillation) (HCC) 08/27/2012   COPD (chronic obstructive pulmonary disease) (HCC) 08/27/2012   GERD (gastroesophageal reflux disease) 08/27/2012   Vitamin deficiency 08/27/2012   Tobacco dependence syndrome 08/27/2012   Carpal tunnel syndrome 02/03/2012   Vitamin B12 deficiency anemia 11/15/2010    Eulis Foster, PT 09/02/21 3:01 PM  [facility] @ [facility] [address] [place], [place], [address] Phone: [phone]   Fax:  [phone]  Name: Reise Hietala MRN: [id number] Date of Birth: 1956/01/07

## 2021-09-02 NOTE — Patient Instructions (Signed)
Moisturizers They are used in the vagina to hydrate the mucous membrane that make up the vaginal canal. Designed to keep a more normal acid balance (ph) Once placed in the vagina, it will last between two to three days.  Use 2-3 times per week at bedtime  Ingredients to avoid is glycerin and fragrance, can increase chance of infection Should not be used just before sex due to causing irritation Most are gels administered either in a tampon-shaped applicator or as a vaginal suppository. They are non-hormonal.   Types of Moisturizers(internal use)  Vitamin E vaginal suppositories- Whole foods, Amazon Moist Again Coconut oil- can break down condoms Julva- (Do no use if on Tamoxifen) amazon Yes moisturizer- amazon NeuEve Silk , NeuEve Silver for menopausal or over 65 (if have severe vaginal atrophy or cancer treatments use NeuEve Silk for  1 month than move to [employer])- [employer], [place].com Olive and Bee intimate cream- [email or web address].com.au Mae vaginal moisturizer- Amazon Aloe    Creams to use externally on the Vulva area Marathon Oil (good for for cancer patients that had radiation to the area)- [place] or [employer].[email or web address]://garcia-[email or web address]/ V-magic cream - amazon Julva-amazon Vital "V Wild Yam salve ( help moisturize and help with thinning vulvar area, does have Beeswax MoodMaid Botanical Pro-Meno Wild Yam Cream- Amazon Desert Harvest Gele Cleo by Zane Herald labial moisturizer (Amazon,  Coconut or olive oil aloe   Things to avoid in the vaginal area Do not use things to irritate the vulvar area No lotions just specialized creams for the vulva area- Neogyn, V-magic, No soaps; can use Aveeno or Calendula cleanser if needed. Must be gentle No deodorants No douches Good to sleep without underwear to let the vaginal area to air out No scrubbing: spread the lips to let warm water rinse over labias and pat dry   No drinking water past 7  Urge Incontinence  Ideal urination  frequency is every 2-4 wakeful hours, which equates to 5-8 times within a 24-hour period.   Urge incontinence is leakage that occurs when the bladder muscle contracts, creating a sudden need to go before getting to the bathroom.   Going too often when your bladder isn't actually full can disrupt the body's automatic signals to store and hold urine longer, which will increase urgency/frequency.  In this case, the bladder "is running the show" and strategies can be learned to retrain this pattern.   One should be able to control the first urge to urinate, at around .  The bladder can hold up to a "grande latte," or . To help you gain control, practice the Urge Drill below when urgency strikes.  This drill will help retrain your bladder signals and allow you to store and hold urine longer.  The overall goal is to stretch out your time between voids to reach a more manageable voiding schedule.    Practice your "quick flicks" often throughout the day (each waking hour) even when you don't need feel the urge to go.  This will help strengthen your pelvic floor muscles, making them more effective in controlling leakage.  Urge Drill  When you feel an urge to go, follow these steps to regain control: Stop what you are doing and be still Take one deep breath, directing your air into your abdomen Think an affirming thought, such as "I've got this." Do 5 quick flicks of your pelvic floor Walk with control to the bathroom to void, or delay voiding   [facility] [address]  [address] [place], [place] [address] Phone # [phone] Fax [phone]

## 2021-09-11 ENCOUNTER — Other Ambulatory Visit: Payer: Self-pay | Admitting: Neurology

## 2021-09-30 ENCOUNTER — Ambulatory Visit: Payer: Medicare Other | Attending: Urology | Admitting: Physical Therapy

## 2021-09-30 ENCOUNTER — Encounter: Payer: Self-pay | Admitting: Physical Therapy

## 2021-09-30 ENCOUNTER — Other Ambulatory Visit: Payer: Self-pay

## 2021-09-30 DIAGNOSIS — M6281 Muscle weakness (generalized): Secondary | ICD-10-CM | POA: Insufficient documentation

## 2021-09-30 DIAGNOSIS — R278 Other lack of coordination: Secondary | ICD-10-CM | POA: Insufficient documentation

## 2021-09-30 DIAGNOSIS — N3941 Urge incontinence: Secondary | ICD-10-CM | POA: Insufficient documentation

## 2021-09-30 NOTE — Therapy (Signed)
[facility] @ [facility] [address] [place], [place], [address] Phone: [phone]   Fax:  [phone]  Physical Therapy Treatment  Patient Details  Name: Tina [name] MRN: [id number] Date of Birth: July 28, 1956 Referring Provider (PT): Dr. Kasandra Knudsen   Encounter Date: 09/30/2021   PT End of Session - 09/30/21 1139     Visit Number 2    Date for PT Re-Evaluation 11/25/21    Authorization Type Medicare and Medicaid    Authorization - Visit Number 2    Authorization - Number of Visits 10    PT Start Time 1100    PT Stop Time 1138    PT Time Calculation (min) 38 min    Activity Tolerance Patient tolerated treatment well    Behavior During Therapy [facility] for tasks assessed/performed             Past Medical History:  Diagnosis Date   Arthritis    "joints; fingers; shoulders; knees; back" (08/27/2012)   Chronic back pain    epidural injections   Chronic neck pain    myelopathy   COPD (chronic obstructive pulmonary disease) (HCC)    Fibromyalgia    "dx'd long time ago; before they really knew what it was" (112/02/2012)   Gastric ulcer    GERD (gastroesophageal reflux disease)    takes Protonix daily   Heart murmur    History of bronchitis several yrs ago   History of colon polyps    Hyperlipidemia    takes Lovastatin daily   Hypertension    takes Losartan daily   Hypertensive retinopathy    OU   Joint swelling    Osteoporosis    Palpitations    takes Atenolol daily   Pneumonia 2013   hx of   Prediabetes    Scoliosis    Sleep apnea    "have a mask but I don't wear it" (08/27/2012)   Weakness    numbness in hands and feet    Past Surgical History:  Procedure Laterality Date   ANTERIOR CERVICAL DECOMP/DISCECTOMY FUSION N/A 07/16/2016   Procedure: ANTERIOR CERVICAL DECOMPRESSION FUSION CERVICAL 3-4, CERVICAL 4-5, CERVICAL 5-6 WITH INSTRUMENATION AND ALLOGRAFT;  Surgeon: Estill Bamberg, MD;  [facility]  OR;  Service: Orthopedics;  Laterality: N/A;  ANTERIOR CERVICAL DECOMPRESSION FUSION CERVICAL 3-4, CERVICAL 4-5, CERVICAL 5-6 WITH INSTRUMENATION AND ALLOGRAFT   CATARACT EXTRACTION Right 05/18/2017   Dr. Marcell Anger   CATARACT EXTRACTION Left uknown   CATARACT EXTRACTION, BILATERAL  2018   COLONOSCOPY     ESOPHAGOGASTRODUODENOSCOPY     EYE SURGERY Bilateral    Cat Sx   fatty tumor removed from right leg     as a teenager   HIP ARTHROPLASTY  08/27/2012   Procedure: ARTHROPLASTY BIPOLAR HIP;  Surgeon: Velna Ochs, MD;  [facility] OR;  Service: Orthopedics;  Laterality: Left;    There were no vitals filed for this visit.   Subjective Assessment - 09/30/21 1104     Subjective I feel like I have not leaked as much. I have cut back on not drinking after 7. I get up 5 times per night. I get up every hour and half.    Patient Stated Goals reduce the amount of times she has to urinate at night.    Currently in Pain? No/denies    Multiple Pain Sites No  Pelvic Floor Special Questions - 09/30/21 0001     Pelvic Floor Internal Exam Patient confirms identification and approves PT to assess pelvic flloor and treatment    Exam Type Vaginal    Strength fair squeeze, definite lift               [facility] Adult PT Treatment/Exercise - 09/30/21 0001       Self-Care   Self-Care Other Self-Care Comments    Other Self-Care Comments  educated patient on using olive oil around the vulvar area to work wtih moisture since difficult to get to store for coconut oil.      Lumbar Exercises: Supine   Ab Set 10 reps   hold 10 seconds, ball between knees   Bridge with [employer] 15 reps;1 second   with pelvic floor contraction   Bridge with [employer] Limitations trouble getting hips up high      Knee/Hip Exercises: Sidelying   Hip ADduction Strengthening;Right;Left;1 set;10 reps   with pelvic floor contraction     Manual Therapy   Manual Therapy  Internal Pelvic Floor    Internal Pelvic Floor manual work to the perineal body, along the sides of the introitus, along the urethra sphincter, release of the urethra                     PT Education - 09/30/21 1138     Education Details Access Code: [email or web address]    Person(s) Educated Patient    Methods Explanation;Demonstration;Verbal cues;Handout    Comprehension Returned demonstration;Verbalized understanding              PT Short Term Goals - 09/30/21 1126       PT SHORT TERM GOAL #1   Title Patient independent with initial HEP for pelvic floor strengthening    Time 4    Period Weeks    Status On-going      PT SHORT TERM GOAL #2   Title understand how to perfrom urge to void exercises to reduce the amount of time she has to urinate at night    Time 4    Period Weeks    Status On-going               PT Long Term Goals - 09/02/21 1452       PT LONG TERM GOAL #1   Title Independent with advanced HEP for pelvic floor strength    Time 12    Period Weeks    Status New    Target Date 11/25/21      PT LONG TERM GOAL #2   Title able to delay the urge to void at night so she is urinating 2 times per night    Time 12    Period Weeks    Status New    Target Date 11/25/21      PT LONG TERM GOAL #3   Title able to walk to the bathroom when she has the urge to urinate and not leak urine due to circular pelvic floor contraction at strength >/= 3/5    Time 12    Period Weeks    Status New    Target Date 11/25/21      PT LONG TERM GOAL #4   Title ability to lift items without urinary leakage due to improved pelvic floor strength >/= 3/5    Time 12    Period Weeks    Status New    Target Date 11/25/21  Plan - 09/30/21 1124     Clinical Impression Statement Patient feels she is having less leakage. She has been practicing her urge to void. Patient gets up 5 times per night. Patient pelvic floor strength is 3/5 and sides are  2/5 unless she uses the hip adductors and strength is 3/5. Patient has HEP to strengthen the pelvic floor and inner thighs. Patient will try using olive oil for vaginal dryness since she was not able to get to the store to reduce dryness. Patient will benefit from skilled therapy to reduce urinary frequency and urgency.    Personal Factors and Comorbidities Comorbidity 2;Fitness    Comorbidities cervical fusion; osteoporosis    Examination-Activity Limitations Continence;Locomotion Level;Bed Mobility;Stand    Stability/Clinical Decision Making Stable/Uncomplicated    Rehab Potential Excellent    PT Frequency 1x / week    PT Duration 12 weeks    PT Treatment/Interventions ADLs/Self Care Home Management;Biofeedback;Therapeutic activities;Therapeutic exercise;Neuromuscular re-education;Patient/family education;Manual techniques    PT Next Visit Plan go over the urge to void;  pelvic floor exercises with hip adductors;    PT Home Exercise Plan Access Code: [email or web address]    Recommended Other Services MD signed initial note    Consulted and Agree with Plan of Care Patient             Patient will benefit from skilled therapeutic intervention in order to improve the following deficits and impairments:  Decreased coordination, Decreased endurance, Decreased strength, Decreased activity tolerance  Visit Diagnosis: Other lack of coordination  Muscle weakness (generalized)  Urge incontinence     Problem List Patient Active Problem List   Diagnosis Date Noted   Cervical disc disease with myelopathy 07/16/2016   Benign essential HTN 12/13/2015   Pneumococcal vaccine refused 01/09/2015   Depression screening 11/23/2013   Hypomagnesemia 04/06/2013   Osteoporosis 04/06/2013   Asthma 02/17/2013   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   B-complex deficiency 02/17/2013   Diverticulosis of colon 02/17/2013   Dysphagia 02/17/2013   Internal hemorrhoids with complication 02/17/2013   Low back pain  02/17/2013   Neck pain 02/17/2013   Obstructive sleep apnea 02/17/2013   Osteoarthrosis 02/17/2013   Pernicious anemia 02/17/2013   Femoral fracture (HCC) 08/27/2012   Tobacco abuse 08/27/2012   Diabetes (HCC) 08/27/2012   Hyperlipidemia 08/27/2012   PAF (paroxysmal atrial fibrillation) (HCC) 08/27/2012   COPD (chronic obstructive pulmonary disease) (HCC) 08/27/2012   GERD (gastroesophageal reflux disease) 08/27/2012   Vitamin deficiency 08/27/2012   Tobacco dependence syndrome 08/27/2012   Carpal tunnel syndrome 02/03/2012   Vitamin B12 deficiency anemia 11/15/2010    Eulis Fosterheryl Zuha Dejonge, PT 09/30/21 11:43 AM  [facility] @ [facility] [address] [place], [place], [address] Phone: [phone]   Fax:  [phone]  Name: Tina [name] MRN: [id number] Date of Birth: 10-02-1955

## 2021-09-30 NOTE — Patient Instructions (Signed)
Access Code: [email or web address] URL: [email or web address] Date: 09/30/2021 Prepared by: Eulis Foster  Exercises Supine Pelvic Floor Contraction - 2 x daily - 7 x weekly - 1 sets - 5 reps - 10 sec hold Supine Bridge with Mini Swiss Ball Between Knees - 1 x daily - 4 x weekly - 1 sets - 15 reps Sidelying Hip Adduction - 1 x daily - 4 x weekly - 2 sets - 10 reps [facility] [address] [place], [place] [address] Phone # [phone] Fax [phone]

## 2021-10-01 ENCOUNTER — Other Ambulatory Visit: Payer: Self-pay | Admitting: Orthopaedic Surgery

## 2021-10-01 DIAGNOSIS — M25512 Pain in left shoulder: Secondary | ICD-10-CM

## 2021-10-11 ENCOUNTER — Other Ambulatory Visit: Payer: Self-pay

## 2021-10-11 ENCOUNTER — Ambulatory Visit: Payer: Medicare Other | Admitting: Physical Therapy

## 2021-10-11 ENCOUNTER — Encounter: Payer: Self-pay | Admitting: Physical Therapy

## 2021-10-11 DIAGNOSIS — R278 Other lack of coordination: Secondary | ICD-10-CM

## 2021-10-11 DIAGNOSIS — N3941 Urge incontinence: Secondary | ICD-10-CM

## 2021-10-11 DIAGNOSIS — M6281 Muscle weakness (generalized): Secondary | ICD-10-CM

## 2021-10-11 NOTE — Patient Instructions (Addendum)
Urge Incontinence  Ideal urination frequency is every 2-4 wakeful hours, which equates to 5-8 times within a 24-hour period.   Urge incontinence is leakage that occurs when the bladder muscle contracts, creating a sudden need to go before getting to the bathroom.   Going too often when your bladder isn't actually full can disrupt the body's automatic signals to store and hold urine longer, which will increase urgency/frequency.  In this case, the bladder is running the show and strategies can be learned to retrain this pattern.   One should be able to control the first urge to urinate, at around 164mL.  The bladder can hold up to a grande latte, or 410mL. To help you gain control, practice the Urge Drill below when urgency strikes.  This drill will help retrain your bladder signals and allow you to store and hold urine longer.  The overall goal is to stretch out your time between voids to reach a more manageable voiding schedule.    Practice your "quick flicks" often throughout the day (each waking hour) even when you don't need feel the urge to go.  This will help strengthen your pelvic floor muscles, making them more effective in controlling leakage.  Urge Drill  When you feel an urge to go, follow these steps to regain control: Stop what you are doing and be still Take one deep breath, directing your air into your abdomen Think an affirming thought, such as I've got this. Do 5 quick flicks of your pelvic floor Watch TV, or think of something that will take your mind off the urge to go to the bathroom Walk with control to the bathroom to void, or delay voiding Each day try to delay going to the bathroom a few more minutes.  At night elevate your legs above your heart for 10 minutes prior to going to sleep.    Access Code: [email or web address] URL: [email or web address] Date: 10/11/2021 Prepared by: Earlie Counts  Exercises Supine Pelvic Floor Contraction - 2 x daily - 7 x weekly -  1 sets - 5 reps - 10 sec hold Supine Bridge with Mini Swiss Ball Between Knees - 1 x daily - 4 x weekly - 1 sets - 15 reps Dead Bug - 1 x daily - 4 x weekly - 2 sets - 10 reps Sit to Stand with Pelvic Floor Contraction - 1 x daily - 3 x weekly - 1 sets - 10 reps Standing Pelvic Tilts with March At [facility] With Pelvic Floor Contraction - 1 x daily - 7 x weekly - 2 sets - 10 reps [facility] [address], [place], [place] [address] Phone # [phone] Fax [phone]

## 2021-10-11 NOTE — Therapy (Signed)
[facility] @ [facility] [address] [place], [place], [address] Phone: [phone]   Fax:  [phone]  Physical Therapy Treatment  Patient Details  Name: Tina [name] MRN: [id number] Date of Birth: [birth year] Referring Provider (PT): Dr. Kasandra Knudsen   Encounter Date: 10/11/2021   PT End of Session - 10/11/21 0905     Visit Number 3    Date for PT Re-Evaluation 11/25/21    Authorization Type Medicare and Medicaid    Authorization - Visit Number 3    Authorization - Number of Visits 10    PT Start Time 0845    PT Stop Time 0923    PT Time Calculation (min) 38 min    Activity Tolerance Patient tolerated treatment well    Behavior During Therapy [facility] for tasks assessed/performed             Past Medical History:  Diagnosis Date   Arthritis    "joints; fingers; shoulders; knees; back" (08/27/2012)   Chronic back pain    epidural injections   Chronic neck pain    myelopathy   COPD (chronic obstructive pulmonary disease) (HCC)    Fibromyalgia    "dx'd long time ago; before they really knew what it was" (112/02/2012)   Gastric ulcer    GERD (gastroesophageal reflux disease)    takes Protonix daily   Heart murmur    History of bronchitis several yrs ago   History of colon polyps    Hyperlipidemia    takes Lovastatin daily   Hypertension    takes Losartan daily   Hypertensive retinopathy    OU   Joint swelling    Osteoporosis    Palpitations    takes Atenolol daily   Pneumonia 2013   hx of   Prediabetes    Scoliosis    Sleep apnea    "have a mask but I don't wear it" (08/27/2012)   Weakness    numbness in hands and feet    Past Surgical History:  Procedure Laterality Date   ANTERIOR CERVICAL DECOMP/DISCECTOMY FUSION N/A 07/16/2016   Procedure: ANTERIOR CERVICAL DECOMPRESSION FUSION CERVICAL 3-4, CERVICAL 4-5, CERVICAL 5-6 WITH INSTRUMENATION AND ALLOGRAFT;  Surgeon: Estill Bamberg, MD;  Location: [facility]  OR;  Service: Orthopedics;  Laterality: N/A;  ANTERIOR CERVICAL DECOMPRESSION FUSION CERVICAL 3-4, CERVICAL 4-5, CERVICAL 5-6 WITH INSTRUMENATION AND ALLOGRAFT   CATARACT EXTRACTION Right 05/18/2017   Dr. Marcell Anger   CATARACT EXTRACTION Left uknown   CATARACT EXTRACTION, BILATERAL  2018   COLONOSCOPY     ESOPHAGOGASTRODUODENOSCOPY     EYE SURGERY Bilateral    Cat Sx   fatty tumor removed from right leg     as a teenager   HIP ARTHROPLASTY  08/27/2012   Procedure: ARTHROPLASTY BIPOLAR HIP;  Surgeon: Velna Ochs, MD;  Location: [facility] OR;  Service: Orthopedics;  Laterality: Left;    There were no vitals filed for this visit.   Subjective Assessment - 10/11/21 0849     Subjective Patient gets up 4-5 per night. Sometimes will leak urine as she is walking to the commode.  Patient wears 1 per night and  one  2-3 times per week that is a light pad. I am using the coconut oil vaginally. I have not done my exercises due to my hip bothering me.    Patient Stated Goals reduce the amount of times she has to urinate at night.    Currently in Pain? No/denies  Multiple Pain Sites No                               [facility] Adult PT Treatment/Exercise - 10/11/21 0001       Neuro Re-ed    Neuro Re-ed Details  educated patient on the urge to void, how to take her mind off the urge, each day try to delay a little, elevate her legs before she goes to sleep      Lumbar Exercises: Standing   Other Standing Lumbar Exercises standing against wall alternate hip flexion with pelvic floor contraction 10x each leg      Lumbar Exercises: Seated   Sit to Stand 10 reps    Sit to Stand Limitations no hands and pelvic floor contraction      Lumbar Exercises: Supine   Bent Knee Raise 10 reps;1 second    Bent Knee Raise Limitations each leg with pelvic floor contraciton    Dead Bug 10 reps;1 second    Dead Bug Limitations each side with pelvic floor contraction    Bridge with  [employer] 15 reps;1 second   with pelvic floor contraction   Bridge with Ball Squeeze Limitations able to get hips higher                     PT Education - 10/11/21 0921     Education Details Access Code: [email or web address]  ; urge to void; elevate legs before going to bed    Person(s) Educated Patient    Methods Explanation;Demonstration;Verbal cues;Handout    Comprehension Returned demonstration;Verbalized understanding              PT Short Term Goals - 10/11/21 0929       PT SHORT TERM GOAL #1   Title Patient independent with initial HEP for pelvic floor strengthening    Time 4    Period Weeks    Status On-going      PT SHORT TERM GOAL #2   Title understand how to perfrom urge to void exercises to reduce the amount of time she has to urinate at night    Time 4    Period Weeks    Status On-going               PT Long Term Goals - 09/02/21 1452       PT LONG TERM GOAL #1   Title Independent with advanced HEP for pelvic floor strength    Time 12    Period Weeks    Status New    Target Date 11/25/21      PT LONG TERM GOAL #2   Title able to delay the urge to void at night so she is urinating 2 times per night    Time 12    Period Weeks    Status New    Target Date 11/25/21      PT LONG TERM GOAL #3   Title able to walk to the bathroom when she has the urge to urinate and not leak urine due to circular pelvic floor contraction at strength >/= 3/5    Time 12    Period Weeks    Status New    Target Date 11/25/21      PT LONG TERM GOAL #4   Title ability to lift items without urinary leakage due to improved pelvic floor strength >/= 3/5    Time 12  Period Weeks    Status New    Target Date 11/25/21                   Plan - 10/11/21 0925     Clinical Impression Statement Patient is having trouble with the urge to void drill. She is not doing all the steps. Today we broke it down so she will understand. Patient is learning pelvic  floor contractions in standing and transitional movements to have her walk to the bathroom without leaking. Patient had difficulty with some of the exercises during the week due to left hip pain. Exercises were changed to not aggravate her left hip. Patient was able to lift her buttocks better with her bridges. Patient will benefit from skilled therapy to reduce urinary frequency and urgency.    Personal Factors and Comorbidities Comorbidity 2;Fitness    Comorbidities cervical fusion; osteoporosis    Examination-Activity Limitations Continence;Locomotion Level;Bed Mobility;Stand    Stability/Clinical Decision Making Stable/Uncomplicated    Rehab Potential Excellent    PT Frequency 1x / week    PT Duration 12 weeks    PT Treatment/Interventions ADLs/Self Care Home Management;Biofeedback;Therapeutic activities;Therapeutic exercise;Neuromuscular re-education;Patient/family education;Manual techniques    PT Next Visit Plan see if the new exercises are working, work in standing with pelvic floor, add quick flicks    PT Home Exercise Plan Access Code: [email or web address]    Consulted and Agree with Plan of Care Patient             Patient will benefit from skilled therapeutic intervention in order to improve the following deficits and impairments:  Decreased coordination, Decreased endurance, Decreased strength, Decreased activity tolerance  Visit Diagnosis: Other lack of coordination  Muscle weakness (generalized)  Urge incontinence     Problem List Patient Active Problem List   Diagnosis Date Noted   Cervical disc disease with myelopathy 07/16/2016   Benign essential HTN 12/13/2015   Pneumococcal vaccine refused 01/09/2015   Depression screening 11/23/2013   Hypomagnesemia 04/06/2013   Osteoporosis 04/06/2013   Asthma 02/17/2013   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   B-complex deficiency 02/17/2013   Diverticulosis of colon 02/17/2013   Dysphagia 02/17/2013   Internal hemorrhoids with  complication 02/17/2013   Low back pain 02/17/2013   Neck pain 02/17/2013   Obstructive sleep apnea 02/17/2013   Osteoarthrosis 02/17/2013   Pernicious anemia 02/17/2013   Femoral fracture (HCC) 08/27/2012   Tobacco abuse 08/27/2012   Diabetes (HCC) 08/27/2012   Hyperlipidemia 08/27/2012   PAF (paroxysmal atrial fibrillation) (HCC) 08/27/2012   COPD (chronic obstructive pulmonary disease) (HCC) 08/27/2012   GERD (gastroesophageal reflux disease) 08/27/2012   Vitamin deficiency 08/27/2012   Tobacco dependence syndrome 08/27/2012   Carpal tunnel syndrome 02/03/2012   Vitamin B12 deficiency anemia 11/15/2010    Eulis Fosterheryl Breylan Lefevers, PT 10/11/21 9:30 AM  [facility] @ [facility] [address] [place], [place], [address] Phone: [phone]   Fax:  [phone]  Name: Tina [name] MRN: [id number] Date of Birth: 27-Jan-1956

## 2021-10-18 ENCOUNTER — Ambulatory Visit: Payer: Medicare Other | Admitting: Physical Therapy

## 2021-10-18 ENCOUNTER — Encounter: Payer: Self-pay | Admitting: Physical Therapy

## 2021-10-18 ENCOUNTER — Other Ambulatory Visit: Payer: Self-pay

## 2021-10-18 DIAGNOSIS — R278 Other lack of coordination: Secondary | ICD-10-CM | POA: Diagnosis not present

## 2021-10-18 DIAGNOSIS — N3941 Urge incontinence: Secondary | ICD-10-CM

## 2021-10-18 DIAGNOSIS — M6281 Muscle weakness (generalized): Secondary | ICD-10-CM

## 2021-10-18 NOTE — Patient Instructions (Signed)
Access Code: [email or web address] URL: [email or web address] Date: 10/18/2021 Prepared by: Earlie Counts  Exercises Supine Pelvic Floor Contraction - 2 x daily - 7 x weekly - 1 sets - 5 reps - 10 sec hold Supine Bridge with Mini Swiss Ball Between Knees - 1 x daily - 4 x weekly - 1 sets - 15 reps Dead Bug - 1 x daily - 4 x weekly - 2 sets - 10 reps Sit to Stand with Pelvic Floor Contraction - 1 x daily - 3 x weekly - 1 sets - 10 reps Standing Pelvic Tilts with March At [facility] With Pelvic Floor Contraction - 1 x daily - 7 x weekly - 2 sets - 10 reps Seated Pelvic Floor Contraction - 4 x daily - 7 x weekly - 1 sets - 10 reps - 0 sec hold Quick Flick Pelvic Floor Contractions Seated - 1 x daily - 7 x weekly - 1 sets - 5 reps - 1 sec hold [place] [address] [place], [place] [address] Phone # [phone] Fax [phone]

## 2021-10-18 NOTE — Therapy (Signed)
[facility] @ [place], [place], [address] Phone: [phone]   Fax:  [phone]  Physical Therapy Treatment  Patient Details  Name: Tina [name] MRN: [id number] Date of Birth: 1956-07-14 Referring Provider (PT): Dr. Jacalyn Lefevre   Encounter Date: 10/18/2021   PT End of Session - 10/18/21 1139     Visit Number 4    Date for PT Re-Evaluation 11/25/21    Authorization Type Medicare and Medicaid    Authorization - Visit Number 4    Authorization - Number of Visits 10    PT Start Time 1100    PT Stop Time 1130    PT Time Calculation (min) 30 min    Activity Tolerance Patient tolerated treatment well    Behavior During Therapy [facility] for tasks assessed/performed             Past Medical History:  Diagnosis Date   Arthritis    "joints; fingers; shoulders; knees; back" (08/27/2012)   Chronic back pain    epidural injections   Chronic neck pain    myelopathy   COPD (chronic obstructive pulmonary disease) ([place])    Fibromyalgia    "dx'd long time ago; before they really knew what it was" (112/02/2012)   Gastric ulcer    GERD (gastroesophageal reflux disease)    takes Protonix daily   Heart murmur    History of bronchitis several yrs ago   History of colon polyps    Hyperlipidemia    takes Lovastatin daily   Hypertension    takes Losartan daily   Hypertensive retinopathy    OU   Joint swelling    Osteoporosis    Palpitations    takes Atenolol daily   Pneumonia 2013   hx of   Prediabetes    Scoliosis    Sleep apnea    "have a mask but I don't wear it" (08/27/2012)   Weakness    numbness in hands and feet    Past Surgical History:  Procedure Laterality Date   ANTERIOR CERVICAL DECOMP/DISCECTOMY FUSION N/A 07/16/2016   Procedure: ANTERIOR CERVICAL DECOMPRESSION FUSION CERVICAL 3-4, CERVICAL 4-5, CERVICAL 5-6 WITH INSTRUMENATION AND ALLOGRAFT;  Surgeon: Phylliss Bob, MD;  Location: [facility];  Service: Orthopedics;  Laterality: N/A;  ANTERIOR CERVICAL DECOMPRESSION FUSION CERVICAL 3-4, CERVICAL 4-5, CERVICAL 5-6 WITH INSTRUMENATION AND ALLOGRAFT   CATARACT EXTRACTION Right 05/18/2017   Dr. Leanora Ivanoff   CATARACT EXTRACTION Left uknown   CATARACT EXTRACTION, BILATERAL  2018   COLONOSCOPY     ESOPHAGOGASTRODUODENOSCOPY     EYE SURGERY Bilateral    Cat Sx   fatty tumor removed from right leg     as a teenager   HIP ARTHROPLASTY  08/27/2012   Procedure: ARTHROPLASTY BIPOLAR HIP;  Surgeon: Hessie Dibble, MD;  Location: [facility];  Service: Orthopedics;  Laterality: Left;    There were no vitals filed for this visit.   Subjective Assessment - 10/18/21 1107     Subjective My doctor says I need a left hip replacement.I am getting to the bathroom without leaking 45% less. Patient gets up 4-5 times per night. I am still having trouble with the behavoral technique to delay the urge. I can hold my urine longer. I can delay the urge 10 minutes at night.    Patient Stated Goals reduce the amount of times she has to urinate at night.    Currently in Pain? No/denies    Multiple Pain  Sites No                [facility] PT Assessment - 10/18/21 0001       Assessment   Medical Diagnosis urinary frequency, nocturia, urge incontinence, urinary urgency    Referring Provider (PT) Dr. Jacalyn Lefevre    Onset Date/Surgical Date --   2 years   Prior Therapy none      Precautions   Precautions Other (comment)    Precaution Comments osteoporosis      Restrictions   Weight Bearing Restrictions No      Prior Function   Level of [place] Independent    Leisure none      Cognition   Overall Cognitive Status Within Functional Limits for tasks assessed      AROM   Overall AROM Comments full lumbar ROM                        Pelvic Floor Special Questions - 10/18/21 0001     Number of Pregnancies 2    Pad use 1 pantyliner    Activities that cause leaking With  strong urge;Lifting   waiting to urinate when she has the urge   Urinary frequency urinate 5-6 times per night               [facility] Adult PT Treatment/Exercise - 10/18/21 0001       Self-Care   Self-Care Other Self-Care Comments    Other Self-Care Comments  educated patient on different pantyliners and she should find the ones that are for incontinence; discussed the urge to void and how she is doing, reviewed it      Lumbar Exercises: Standing   Other Standing Lumbar Exercises standing against wall alternate hip flexion with pelvic floor contraction 10x each leg and hold onto chair      Lumbar Exercises: Seated   Other Seated Lumbar Exercises seated pelvic floor contraction holding for 10 seconds and quick flicks                     PT Education - 10/18/21 1150     Education Details Access Code: [email or web address]    Person(s) Educated Patient    Methods Explanation;Demonstration;Verbal cues;Handout    Comprehension Returned demonstration;Verbalized understanding              PT Short Term Goals - 10/18/21 1148       PT SHORT TERM GOAL #1   Title Patient independent with initial HEP for pelvic floor strengthening    Time 4    Period Weeks    Status Achieved      PT SHORT TERM GOAL #2   Title understand how to perfrom urge to void exercises to reduce the amount of time she has to urinate at night    Time 4    Period Weeks    Status Achieved               PT Long Term Goals - 10/18/21 1150       PT LONG TERM GOAL #1   Title Independent with advanced HEP for pelvic floor strength    Time 12    Period Weeks    Status On-going      PT LONG TERM GOAL #2   Title able to delay the urge to void at night so she is urinating 2 times per night    Time 12    Period Weeks  Status On-going    Target Date 11/25/21      PT LONG TERM GOAL #3   Title able to walk to the bathroom when she has the urge to urinate and not leak urine due to circular pelvic  floor contraction at strength >/= 3/5    Time 12    Period Weeks    Status On-going      PT LONG TERM GOAL #4   Title ability to lift items without urinary leakage due to improved pelvic floor strength >/= 3/5    Time 12    Period Weeks    Status On-going                   Plan - 10/18/21 1140     Clinical Impression Statement Patient is able to get to the bathroom without leaking 45% of the time. She is able to hold her urine longer. Patient is able to do her HEP and was reviewed. Patient is in need for a hip replacement on the left which can make standing exercises harder. Patient will benefit from skilled therapy to reduce urinary frequency and urgency.    Personal Factors and Comorbidities Comorbidity 2;Fitness    Comorbidities cervical fusion; osteoporosis    Examination-Activity Limitations Continence;Locomotion Level;Bed Mobility;Stand    Stability/Clinical Decision Making Stable/Uncomplicated    Rehab Potential Excellent    PT Frequency 1x / week    PT Duration 12 weeks    PT Treatment/Interventions ADLs/Self Care Home Management;Biofeedback;Therapeutic activities;Therapeutic exercise;Neuromuscular re-education;Patient/family education;Manual techniques    PT Next Visit Plan See if there are any changes if none then possible discharge    PT Home Exercise Plan Access Code: [email or web address]    Consulted and Agree with Plan of Care Patient             Patient will benefit from skilled therapeutic intervention in order to improve the following deficits and impairments:  Decreased coordination, Decreased endurance, Decreased strength, Decreased activity tolerance  Visit Diagnosis: Other lack of coordination  Muscle weakness (generalized)  Urge incontinence     Problem List Patient Active Problem List   Diagnosis Date Noted   Cervical disc disease with myelopathy 07/16/2016   Benign essential HTN 12/13/2015   Pneumococcal vaccine refused 01/09/2015   Depression  screening 11/23/2013   Hypomagnesemia 04/06/2013   Osteoporosis 04/06/2013   Asthma 02/17/2013   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   B-complex deficiency 02/17/2013   Diverticulosis of colon 02/17/2013   Dysphagia 02/17/2013   Internal hemorrhoids with complication [id number]   Low back pain 02/17/2013   Neck pain 02/17/2013   Obstructive sleep apnea 02/17/2013   Osteoarthrosis 02/17/2013   Pernicious anemia 02/17/2013   Femoral fracture (HCC) 08/27/2012   Tobacco abuse 08/27/2012   Diabetes ([place]) 08/27/2012   Hyperlipidemia 08/27/2012   PAF (paroxysmal atrial fibrillation) ([place]) 08/27/2012   COPD (chronic obstructive pulmonary disease) ([place]) 08/27/2012   GERD (gastroesophageal reflux disease) 08/27/2012   Vitamin deficiency 08/27/2012   Tobacco dependence syndrome 08/27/2012   Carpal tunnel syndrome 02/03/2012   Vitamin B12 deficiency anemia 11/15/2010    Earlie Counts, PT 10/18/21 11:51 AM  [facility] @ [place], [place], [address] Phone: [phone]   Fax:  [phone]  Name: Tina [name] MRN: [id number] Date of Birth: [birth year]

## 2021-10-22 ENCOUNTER — Telehealth: Payer: Self-pay

## 2021-10-22 DIAGNOSIS — R1012 Left upper quadrant pain: Secondary | ICD-10-CM

## 2021-10-22 DIAGNOSIS — K862 Cyst of pancreas: Secondary | ICD-10-CM

## 2021-10-22 NOTE — Telephone Encounter (Signed)
-----   Message from Roetta Sessions, [place] sent at 11/19/2020  9:46 AM EST ----- Regarding: due for MRI MRCP Monitor pancreatic cyst:  MR/MRCP abdomen W and WO due in Feb 2023

## 2021-10-22 NOTE — Addendum Note (Signed)
Addended by: Cooper Render on: 10/22/2021 12:17 PM   Modules accepted: Orders

## 2021-10-22 NOTE — Telephone Encounter (Signed)
MRCP scheduled for Wed, Feb 15th at 9:00 am to arrive at 8:30 am.  NPO 4 hours.  Patient will need labs prior to exam. BMET entered. Patient has appointment on Friday 2-3. Will need instructions and to go to lab

## 2021-10-25 ENCOUNTER — Encounter: Payer: Self-pay | Admitting: Gastroenterology

## 2021-10-25 ENCOUNTER — Ambulatory Visit (INDEPENDENT_AMBULATORY_CARE_PROVIDER_SITE_OTHER): Payer: Medicare Other | Admitting: Gastroenterology

## 2021-10-25 ENCOUNTER — Encounter: Payer: Medicaid Other | Admitting: Physical Therapy

## 2021-10-25 VITALS — BP 134/62 | HR 66 | Ht 65.0 in | Wt 125.0 lb

## 2021-10-25 DIAGNOSIS — K31A0 Gastric intestinal metaplasia, unspecified: Secondary | ICD-10-CM

## 2021-10-25 DIAGNOSIS — K862 Cyst of pancreas: Secondary | ICD-10-CM | POA: Diagnosis not present

## 2021-10-25 DIAGNOSIS — K219 Gastro-esophageal reflux disease without esophagitis: Secondary | ICD-10-CM

## 2021-10-25 MED ORDER — FAMOTIDINE 20 MG PO TABS: 20.0000 mg | ORAL_TABLET | Freq: Two times a day (BID) | ORAL | 1 refills | Status: DC | PRN

## 2021-10-25 NOTE — Progress Notes (Signed)
HPI :  66 year old female here for a follow-up visit for pancreatic cyst, GERD, GIM.  She has had a benign appearing 1 cm pancreatic cyst noted since imaging in 2018.  She has had a few MRCPs since that time which have shown stability.  Her last MRCP was performed 11/09/20 - the cyst in the body of her pancreas measured 3mm, smaller in size compared to prior exams.  She continues to feel okay since I've last seen her.  She denies any weight loss.  Eating okay in general.  There is no family history of pancreatic cancer.  She thinks her nephew was actually recently diagnosed with gastric cancer.  She had previously been on Protonix 40 mg once to twice daily for reflux symptoms in the past.  At her last visit she felt symptoms had been breaking through a lower dose Protonix and we had actually increase her dose to twice daily.  She states about August last year she was taken off the Protonix entirely and switch to famotidine as she was concerned about side effects.  She states she takes famotidine 20 mg daily.  She will often use Tums as needed for breakthrough of this.  She denies any dysphagia.  She inquires about long-term risks of chronic PPIs.  She states she does have a history of osteoporosis.  She continues to smoke tobacco.  She denies any postprandial pains.  She did have an EGD at another facility in 2018 and was told she had gastric intestinal metaplasia.  We had discussed surveillance of this at some point in the past.  Of note she has claustrophobia and has had difficulty with MRIs in the past.  She has required open MRIs to have these exams done.  She has had a prior extensive workup by [facility] as outlined below:   [name] - 05/05/17 - 1.3cm gallstone, 0.2cm polyp, normal exam otherwise CT scan abdomen - 05/27/17 - gallstones, 26mm pancreatic cyst in PD, 55mm cyst in tail, diffuse atherosclerosis   EGD 03/20/17 - mild esophagitis, gastritis, normal duodenum - biopsies taken to rule out H pylori -  path shows no H pylori, but findings include incidental finding of gastric intestinal metaplasia, normal esophageal biopsies Colonoscopy 03/19/17 - normal   MRCP 09/29/2019 - IMPRESSION: 1. Stable 9 mm cystic lesion in the body of the pancreas. No signs of main duct dilation or enhancement. Recommend follow up pre and post contrast MRI/MRCP or pancreatic protocol CT in 1 year, this should continue for a total of 5 years from the initial imaging study. This recommendation follows ACR consensus guidelines: Management of Incidental Pancreatic Cysts: A White Paper of the ACR Incidental Findings Committee. J Am Coll Radiol 2017;14:911-923. 2. Stable small splenic cyst. 3. Atherosclerotic changes throughout the abdominal aorta.    MRCP 11/09/20: IMPRESSION: 1. Stable small cyst within body of pancreas. This has been stable since 05/27/17. Continued interval surveillance is advised. The next follow-up exam should be obtained in 12 months with repeat pancreas protocol MRI without and with contrast material. 2. Stable appearance of small liver and splenic cysts. 3. Cholelithiasis.    Past Medical History:  Diagnosis Date   Arthritis    "joints; fingers; shoulders; knees; back" (08/27/2012)   Chronic back pain    epidural injections   Chronic neck pain    myelopathy   COPD (chronic obstructive pulmonary disease) (HCC)    Fibromyalgia    "dx'd long time ago; before they really knew what it was" (112/02/2012)  Gastric ulcer    GERD (gastroesophageal reflux disease)    takes Protonix daily   Heart murmur    History of bronchitis several yrs ago   History of colon polyps    Hyperlipidemia    takes Lovastatin daily   Hypertension    takes Losartan daily   Hypertensive retinopathy    OU   Joint swelling    Osteoporosis    Palpitations    takes Atenolol daily   Pneumonia 2013   hx of   Prediabetes    Scoliosis    Sleep apnea    "have a mask but I don't wear it" (08/27/2012)    Weakness    numbness in hands and feet     Past Surgical History:  Procedure Laterality Date   ANTERIOR CERVICAL DECOMP/DISCECTOMY FUSION N/A 07/16/2016   Procedure: ANTERIOR CERVICAL DECOMPRESSION FUSION CERVICAL 3-4, CERVICAL 4-5, CERVICAL 5-6 WITH INSTRUMENATION AND ALLOGRAFT;  Surgeon: Estill BambergMark Dumonski, MD;  Location: [facility] OR;  Service: Orthopedics;  Laterality: N/A;  ANTERIOR CERVICAL DECOMPRESSION FUSION CERVICAL 3-4, CERVICAL 4-5, CERVICAL 5-6 WITH INSTRUMENATION AND ALLOGRAFT   CATARACT EXTRACTION Right 05/18/2017   Dr. Marcell AngerFleischman   CATARACT EXTRACTION Left uknown   CATARACT EXTRACTION, BILATERAL  2018   COLONOSCOPY     ESOPHAGOGASTRODUODENOSCOPY     EYE SURGERY Bilateral    Cat Sx   fatty tumor removed from right leg     as a teenager   HIP ARTHROPLASTY  08/27/2012   Procedure: ARTHROPLASTY BIPOLAR HIP;  Surgeon: Velna OchsPeter G Dalldorf, MD;  Location: [facility] OR;  Service: Orthopedics;  Laterality: Left;   ROTATOR CUFF REPAIR     Family History  Problem Relation Age of Onset   Throat cancer Mother    Stroke Sister    Seizures Sister    Dementia Sister    Lung cancer Brother    Stroke Sister    Prostate cancer Father    Hypertension Father    Breast cancer Maternal Grandmother    Diabetes Other    Thyroid disease Neg Hx    Social History   Tobacco Use   Smoking status: Every Day    Packs/day: 0.50    Years: 30.00    Pack years: 15.00    Types: Cigarettes   Smokeless tobacco: Never  Vaping Use   Vaping Use: Never used  Substance Use Topics   Alcohol use: No   Drug use: No   Current Outpatient Medications  Medication Sig Dispense Refill   acetaminophen (TYLENOL) 500 MG tablet Take 500 mg by mouth every 6 (six) hours as needed.     alendronate (FOSAMAX) 70 MG tablet Take 70 mg by mouth once a week. Take with a full glass of water on an empty stomach.     amLODipine (NORVASC) 10 MG tablet Take 10 mg by mouth daily.     cetirizine (ZYRTEC) 10 MG tablet Take 10 mg by mouth  daily.     cholecalciferol (VITAMIN D) 25 MCG (1000 UNIT) tablet Take by mouth 2 (two) times daily.     famotidine (PEPCID) 20 MG tablet TAKE 1 TABLET (20 MG TOTAL) BY MOUTH 2 (TWO) TIMES DAILY AS NEEDED. 180 tablet 1   gabapentin (NEURONTIN) 300 MG capsule TAKE 1 CAPSULE BY MOUTH TWICE A DAY 180 capsule 0   lovastatin (MEVACOR) 20 MG tablet Take 20 mg by mouth at bedtime.     valsartan (DIOVAN) 160 MG tablet Take 160 mg by mouth daily. for high blood pressure  1   No current facility-administered medications for this visit.   Allergies  Allergen Reactions   Hydrocodone-Acetaminophen Palpitations and Other (See Comments)    Other reaction(s): Dizziness  chest pain   Tramadol Other (See Comments)    "It makes my chest feel funny; I don't like to take that"   Cortizone-10 [Hydrocortisone]     Shots- Headaches    Vicodin [Hydrocodone-Acetaminophen]    Lisinopril Other (See Comments)    cough     Review of Systems: All systems reviewed and negative except where noted in HPI.    Physical Exam: BP 134/62    Pulse 66    Ht 5\' 5"  (1.651 m)    Wt 125 lb (56.7 kg)    BMI 20.80 kg/m  Constitutional: Pleasant,well-developed, female in no acute distress. Neurological: Alert and oriented to person place and time. Psychiatric: Normal mood and affect. Behavior is normal.   ASSESSMENT AND PLAN: 66 year old female here for reassessment of the following:  Pancreatic cyst GERD Gastric intestinal metaplasia  She has had a stable appearing pancreatic cyst since 2018, in fact the size is smaller now than it has been previously, now 0.6 cm.  She is asymptomatic from this.  Radiology guidelines had recommended follow-up yearly for this type of cyst however this is in contrast to guidelines from 2019 of gastroenterology where surveillance frequency is based on size and for cyst less than 1 cm okay to survey this every 2 years.  I discussed discrepancies in guidelines for pancreatic cysts  with her in general in regards to how frequently to survey this.  I think a reasonable to extend her next surveillance exam to 2 years from her last exam based on ACG guidelines and stability of the cyst for almost 5 years at this point.  She was in agreement with this and would like to do her next MRCP in February 2024.  Otherwise we discussed her reflux regimen and long-term risks of PPI.  She has osteoporosis and agree that we should try to avoid using PPIs if possible, or use lowest dose to control symptoms.  She is now on Pepcid but having some breakthrough on a fairly frequent basis.  She can try increasing her Pepcid to twice daily at this point time.  She also has a history of gastric intestinal metaplasia almost 5 years ago.  I offered her an EGD to survey her stomach for gastric intestinal metaplasia, and to reassess her esophagus for any Barrett's or concerning changes while off PPI, as she would like to avoid PPI but unclear how well her symptoms will be controlled long-term on Pepcid monotherapy.  We discussed risks and benefits of each.  She wants to increase dose of Pepcid to twice daily at this point and will await EGD with further recommendations.  Plan: - change next MRCP surveillance date to 10/2022 - refill pepcid - 20mg  once or twice daily PRN  - schedule EGD at [facility] as above  , MD [facility]

## 2021-10-25 NOTE — Patient Instructions (Addendum)
If you are age 66 or older, your body mass index should be between 23-30. Your Body mass index is 20.8 kg/m. If this is out of the aforementioned range listed, please consider follow up with your Primary Care Provider.  If you are age 15 or younger, your body mass index should be between 19-25. Your Body mass index is 20.8 kg/m. If this is out of the aformentioned range listed, please consider follow up with your Primary Care Provider.   ________________________________________________________  The [facility] providers would like to encourage you to use [facility] to communicate with providers for non-urgent requests or questions.  Due to long hold times on the telephone, sending your provider a message by [facility] may be a faster and more efficient way to get a response.  Please allow 48 business hours for a response.  Please remember that this is for non-urgent requests.  _______________________________________________________  Bonita Quin have been scheduled for an endoscopy. Please follow written instructions given to you at your visit today. If you use inhalers (even only as needed), please bring them with you on the day of your procedure.  We have sent the following medications to your pharmacy for you to pick up at your convenience: Pepcid 20 mg : Take once to twice a day as needed.  You will be due for a recall colonoscopy in 02-2027.. We will send you a reminder in the mail when it gets closer to that time.  You will be due for a recall MRCP next year 10-2022.  Thank you for entrusting me with your care and for choosing [facility], Dr. Ileene Patrick

## 2021-10-28 ENCOUNTER — Encounter: Payer: Self-pay | Admitting: Physical Therapy

## 2021-10-28 ENCOUNTER — Other Ambulatory Visit: Payer: Self-pay

## 2021-10-28 ENCOUNTER — Ambulatory Visit: Payer: Medicare Other | Attending: Urology | Admitting: Physical Therapy

## 2021-10-28 DIAGNOSIS — M6281 Muscle weakness (generalized): Secondary | ICD-10-CM | POA: Diagnosis present

## 2021-10-28 DIAGNOSIS — N3941 Urge incontinence: Secondary | ICD-10-CM | POA: Insufficient documentation

## 2021-10-28 DIAGNOSIS — R278 Other lack of coordination: Secondary | ICD-10-CM | POA: Diagnosis not present

## 2021-10-28 NOTE — Therapy (Signed)
[facility] @ [facility] [address] [place], [place], [address] Phone: [phone]   Fax:  [phone]  Physical Therapy Treatment  Patient Details  Name: Tina [name] MRN: [id number] Date of Birth: May 13, 1956 Referring Provider (PT): Dr. Kasandra Knudsen   Encounter Date: 10/28/2021   PT End of Session - 10/28/21 1233     Visit Number 5    Date for PT Re-Evaluation 11/25/21    Authorization Type Medicare and Medicaid    Authorization - Visit Number 5    Authorization - Number of Visits 10    PT Start Time 1230    PT Stop Time 1308    PT Time Calculation (min) 38 min    Activity Tolerance Patient tolerated treatment well    Behavior During Therapy [facility] for tasks assessed/performed             Past Medical History:  Diagnosis Date   Arthritis    "joints; fingers; shoulders; knees; back" (08/27/2012)   Chronic back pain    epidural injections   Chronic neck pain    myelopathy   COPD (chronic obstructive pulmonary disease) (HCC)    Fibromyalgia    "dx'd long time ago; before they really knew what it was" (112/02/2012)   Gastric ulcer    GERD (gastroesophageal reflux disease)    takes Protonix daily   Heart murmur    History of bronchitis several yrs ago   History of colon polyps    Hyperlipidemia    takes Lovastatin daily   Hypertension    takes Losartan daily   Hypertensive retinopathy    OU   Joint swelling    Osteoporosis    Palpitations    takes Atenolol daily   Pneumonia 2013   hx of   Prediabetes    Scoliosis    Sleep apnea    "have a mask but I don't wear it" (08/27/2012)   Weakness    numbness in hands and feet    Past Surgical History:  Procedure Laterality Date   ANTERIOR CERVICAL DECOMP/DISCECTOMY FUSION N/A 07/16/2016   Procedure: ANTERIOR CERVICAL DECOMPRESSION FUSION CERVICAL 3-4, CERVICAL 4-5, CERVICAL 5-6 WITH INSTRUMENATION AND ALLOGRAFT;  Surgeon: Estill Bamberg, MD;  Location: [facility]  OR;  Service: Orthopedics;  Laterality: N/A;  ANTERIOR CERVICAL DECOMPRESSION FUSION CERVICAL 3-4, CERVICAL 4-5, CERVICAL 5-6 WITH INSTRUMENATION AND ALLOGRAFT   CATARACT EXTRACTION Right 05/18/2017   Dr. Marcell Anger   CATARACT EXTRACTION Left uknown   CATARACT EXTRACTION, BILATERAL  2018   COLONOSCOPY     ESOPHAGOGASTRODUODENOSCOPY     EYE SURGERY Bilateral    Cat Sx   fatty tumor removed from right leg     as a teenager   HIP ARTHROPLASTY  08/27/2012   Procedure: ARTHROPLASTY BIPOLAR HIP;  Surgeon: Velna Ochs, MD;  Location: [facility] OR;  Service: Orthopedics;  Laterality: Left;   ROTATOR CUFF REPAIR      There were no vitals filed for this visit.   Subjective Assessment - 10/28/21 1234     Subjective I am getitng up 4 times now. I see the urologist friday. Urinary leakage has improved by 65%. When she has the urge to urinate she is able to walk to the bathroom with less leakage.    Patient Stated Goals reduce the amount of times she has to urinate at night.    Currently in Pain? No/denies    Multiple Pain Sites No  Pelvic Floor Special Questions - 10/28/21 0001     Activities that cause leaking With strong urge;Lifting   waiting to urinate when she has the urge   Urinary frequency urinate every 4 times               [facility] Adult PT Treatment/Exercise - 10/28/21 0001       Lumbar Exercises: Standing   Other Standing Lumbar Exercises stand and squeeze the ball between knees and alteranate shoulder flexion 20x; walking sideways with pelvic floor contraction 5 times for 6 times      Lumbar Exercises: Supine   Bridge with [employer] Squeeze 15 reps;1 second    Bridge with [employer] Limitations to contract the pelvic floor and watching for hip pain    Other Supine Lumbar Exercises ball squeeze in hookly with knee extension contracting the pelvic floor 10x each leg    Other Supine Lumbar Exercises hookly wth ball squeeze with  alternate shoulder flexion using red band 20x; then bilateral shoulder extension with red band 20x                       PT Short Term Goals - 10/18/21 1148       PT SHORT TERM GOAL #1   Title Patient independent with initial HEP for pelvic floor strengthening    Time 4    Period Weeks    Status Achieved      PT SHORT TERM GOAL #2   Title understand how to perfrom urge to void exercises to reduce the amount of time she has to urinate at night    Time 4    Period Weeks    Status Achieved               PT Long Term Goals - 10/18/21 1150       PT LONG TERM GOAL #1   Title Independent with advanced HEP for pelvic floor strength    Time 12    Period Weeks    Status On-going      PT LONG TERM GOAL #2   Title able to delay the urge to void at night so she is urinating 2 times per night    Time 12    Period Weeks    Status On-going    Target Date 11/25/21      PT LONG TERM GOAL #3   Title able to walk to the bathroom when she has the urge to urinate and not leak urine due to circular pelvic floor contraction at strength >/= 3/5    Time 12    Period Weeks    Status On-going      PT LONG TERM GOAL #4   Title ability to lift items without urinary leakage due to improved pelvic floor strength >/= 3/5    Time 12    Period Weeks    Status On-going                   Plan - 10/28/21 1312     Clinical Impression Statement Patient is getting up 4 times per night instead of 6 times. She reports her leakage is 65% better as she is walking to the commode. Patient is doing her HEP. Patient will be seeing her urologist on Friday. Patient will benefit from skilled therapy to reduce urinary frequency and urgency.    Personal Factors and Comorbidities Comorbidity 2;Fitness    Comorbidities cervical fusion; osteoporosis  Examination-Activity Limitations Continence;Locomotion Level;Bed Mobility;Stand    Stability/Clinical Decision Making Stable/Uncomplicated     Rehab Potential Excellent    PT Frequency 1x / week    PT Duration 12 weeks    PT Treatment/Interventions ADLs/Self Care Home Management;Biofeedback;Therapeutic activities;Therapeutic exercise;Neuromuscular re-education;Patient/family education;Manual techniques    PT Next Visit Plan See what urologist has said; add the new exercises that were given today.    PT Home Exercise Plan Access Code: [email or web address]    Consulted and Agree with Plan of Care Patient             Patient will benefit from skilled therapeutic intervention in order to improve the following deficits and impairments:  Decreased coordination, Decreased endurance, Decreased strength, Decreased activity tolerance  Visit Diagnosis: Other lack of coordination  Muscle weakness (generalized)  Urge incontinence     Problem List Patient Active Problem List   Diagnosis Date Noted   Cervical disc disease with myelopathy 07/16/2016   Benign essential HTN 12/13/2015   Pneumococcal vaccine refused 01/09/2015   Depression screening 11/23/2013   Hypomagnesemia 04/06/2013   Osteoporosis 04/06/2013   Asthma 02/17/2013   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   B-complex deficiency 02/17/2013   Diverticulosis of colon 02/17/2013   Dysphagia 02/17/2013   Internal hemorrhoids with complication 02/17/2013   Low back pain 02/17/2013   Neck pain 02/17/2013   Obstructive sleep apnea 02/17/2013   Osteoarthrosis 02/17/2013   Pernicious anemia 02/17/2013   Femoral fracture (HCC) 08/27/2012   Tobacco abuse 08/27/2012   Diabetes (HCC) 08/27/2012   Hyperlipidemia 08/27/2012   PAF (paroxysmal atrial fibrillation) (HCC) 08/27/2012   COPD (chronic obstructive pulmonary disease) (HCC) 08/27/2012   GERD (gastroesophageal reflux disease) 08/27/2012   Vitamin deficiency 08/27/2012   Tobacco dependence syndrome 08/27/2012   Carpal tunnel syndrome 02/03/2012   Vitamin B12 deficiency anemia 11/15/2010    Eulis Foster, PT 10/28/21 1:16  PM  [facility] @ [facility] [address] [place], [place], [address] Phone: [phone]   Fax:  [phone]  Name: Eleen Litz MRN: [id number] Date of Birth: 08/15/1956

## 2021-11-01 ENCOUNTER — Ambulatory Visit
Admission: RE | Admit: 2021-11-01 | Discharge: 2021-11-01 | Disposition: A | Discharge status: Home / Self Care | Payer: Medicare Other | Source: Ambulatory Visit | Attending: Orthopaedic Surgery | Admitting: Orthopaedic Surgery

## 2021-11-01 ENCOUNTER — Other Ambulatory Visit: Payer: Self-pay

## 2021-11-01 DIAGNOSIS — M25512 Pain in left shoulder: Secondary | ICD-10-CM

## 2021-11-06 ENCOUNTER — Ambulatory Visit (HOSPITAL_COMMUNITY): Payer: Medicaid Other

## 2021-11-18 ENCOUNTER — Encounter: Payer: Self-pay | Admitting: Physical Therapy

## 2021-11-22 ENCOUNTER — Telehealth: Payer: Self-pay

## 2021-11-22 NOTE — Telephone Encounter (Signed)
Called and spoke to patient. Let her know we have cancelled her EGD for 3-10 in the [facility] and added her to the [facility] waitlist.  She was not aware she was a diff airway, upset to be cancelled from the [facility]  with such little notice and wants her procedure done asap.  She prefers Monday or Friday because of her daughter's schedule. I let her know that we have very limited flexibility when it comes to dates and we will let her know as soon as we can.   ?

## 2021-11-22 NOTE — Telephone Encounter (Signed)
-----   Message from Benancio Deeds, MD sent at 11/22/2021  5:03 PM EST ----- ?Got it, thanks for catching this. ? ?[place] / Jan, ?Unfortunately the patient cannot have her EGD at the [facility] due to being a difficult airway. Can you please let her know and cancel her appointment on 11/29/21 in the [facility]. She will need to be added to the hospital wait list. Fortunately her case is not urgent and I think can wait a few months. ? ?Dr. Adela Lank ? ? ?----- Message ----- ?From: Cathlyn Parsons, CRNA ?Sent: 11/22/2021   4:22 PM EST ?To: Benancio Deeds, MD ? ?Dr. Adela Lank, ? ?This pt is scheduled with you on 11/29/21. She is a documented difficult intubation and her procedure will need to be done at the hospital.   ? ?Thanks, ? ?Cathlyn Parsons ? ? ?

## 2021-11-25 ENCOUNTER — Other Ambulatory Visit: Payer: Self-pay

## 2021-11-25 ENCOUNTER — Encounter: Payer: Self-pay | Admitting: Physical Therapy

## 2021-11-25 ENCOUNTER — Ambulatory Visit: Payer: Medicare Other | Attending: Urology | Admitting: Physical Therapy

## 2021-11-25 DIAGNOSIS — N3941 Urge incontinence: Secondary | ICD-10-CM | POA: Insufficient documentation

## 2021-11-25 DIAGNOSIS — M6281 Muscle weakness (generalized): Secondary | ICD-10-CM | POA: Insufficient documentation

## 2021-11-25 DIAGNOSIS — R278 Other lack of coordination: Secondary | ICD-10-CM | POA: Diagnosis not present

## 2021-11-25 NOTE — Therapy (Signed)
[facility] @ [facility] [address] [place], [place], [address] Phone: [phone]   Fax:  [phone]  Physical Therapy Treatment  Patient Details  Name: Tina [name] MRN: [id number] Date of Birth: [birth year] Referring Provider (PT): Dr. Kasandra Knudsen   Encounter Date: 11/25/2021   PT End of Session - 11/25/21 1107     Visit Number 6    Date for PT Re-Evaluation 02/17/22    Authorization Type Medicare and Medicaid    Authorization - Visit Number 6    Authorization - Number of Visits 10    PT Start Time 1100    PT Stop Time 1140    PT Time Calculation (min) 40 min    Activity Tolerance Patient tolerated treatment well    Behavior During Therapy [facility] for tasks assessed/performed             Past Medical History:  Diagnosis Date   Arthritis    "joints; fingers; shoulders; knees; back" (08/27/2012)   Chronic back pain    epidural injections   Chronic neck pain    myelopathy   COPD (chronic obstructive pulmonary disease) (HCC)    Fibromyalgia    "dx'd long time ago; before they really knew what it was" (112/02/2012)   Gastric ulcer    GERD (gastroesophageal reflux disease)    takes Protonix daily   Heart murmur    History of bronchitis several yrs ago   History of colon polyps    Hyperlipidemia    takes Lovastatin daily   Hypertension    takes Losartan daily   Hypertensive retinopathy    OU   Joint swelling    Osteoporosis    Palpitations    takes Atenolol daily   Pneumonia 2013   hx of   Prediabetes    Scoliosis    Sleep apnea    "have a mask but I don't wear it" (08/27/2012)   Weakness    numbness in hands and feet    Past Surgical History:  Procedure Laterality Date   ANTERIOR CERVICAL DECOMP/DISCECTOMY FUSION N/A 07/16/2016   Procedure: ANTERIOR CERVICAL DECOMPRESSION FUSION CERVICAL 3-4, CERVICAL 4-5, CERVICAL 5-6 WITH INSTRUMENATION AND ALLOGRAFT;  Surgeon: Estill Bamberg, MD;  Location: [facility]  OR;  Service: Orthopedics;  Laterality: N/A;  ANTERIOR CERVICAL DECOMPRESSION FUSION CERVICAL 3-4, CERVICAL 4-5, CERVICAL 5-6 WITH INSTRUMENATION AND ALLOGRAFT   CATARACT EXTRACTION Right 05/18/2017   Dr. Marcell Anger   CATARACT EXTRACTION Left uknown   CATARACT EXTRACTION, BILATERAL  2018   COLONOSCOPY     ESOPHAGOGASTRODUODENOSCOPY     EYE SURGERY Bilateral    Cat Sx   fatty tumor removed from right leg     as a teenager   HIP ARTHROPLASTY  08/27/2012   Procedure: ARTHROPLASTY BIPOLAR HIP;  Surgeon: Velna Ochs, MD;  Location: [facility] OR;  Service: Orthopedics;  Laterality: Left;   ROTATOR CUFF REPAIR      There were no vitals filed for this visit.   Subjective Assessment - 11/25/21 1109     Subjective .Sometimes the urge is better and sometimes it is not. I still get up at night 4-6 times. When holding urine to prevent leakage is 60% better.    Patient Stated Goals reduce the amount of times she has to urinate at night.    Currently in Pain? No/denies    Multiple Pain Sites No                [facility] PT  Assessment - 11/25/21 0001       Assessment   Medical Diagnosis urinary frequency, nocturia, urge incontinence, urinary urgency    Referring Provider (PT) Dr. Kasandra KnudsenMaryellen Pace    Onset Date/Surgical Date --   2 years   Prior Therapy none      Precautions   Precautions Other (comment)    Precaution Comments osteoporosis      Restrictions   Weight Bearing Restrictions No      Prior Function   Level of Independence Independent    Leisure none      Cognition   Overall Cognitive Status Within Functional Limits for tasks assessed      Posture/Postural Control   Posture/Postural Control No significant limitations      AROM   Overall AROM Comments full lumbar ROM                        Pelvic Floor Special Questions - 11/25/21 0001     Number of Pregnancies 2    Urinary Leakage Yes    Pad use 1 pantyliner    Activities that cause leaking With  strong urge   waiting to urinate when she has the urge   Urinary frequency urinate 4-5 times compared to 6-7    Exam Type Deferred    Strength fair squeeze, definite lift               [facility] Adult PT Treatment/Exercise - 11/25/21 0001       Self-Care   Self-Care Other Self-Care Comments    Other Self-Care Comments  educated patient on where to place the coconut oil around the vulva and internally. She is using it several times per week.      Lumbar Exercises: Standing   Other Standing Lumbar Exercises standing pallof with red band 15 times each way with pelvic floor contraction    Other Standing Lumbar Exercises standing pushing the knee into the wall to contract the gluteus medius holding for 3 seconds 10x each leg      Lumbar Exercises: Seated   Other Seated Lumbar Exercises seated pelvic floor contraction holding for 10 seconds and quick flicks      Lumbar Exercises: Supine   Dead Bug 10 reps;1 second   each side with pelvic floor contraction                      PT Short Term Goals - 11/25/21 1114       PT SHORT TERM GOAL #1   Title Patient independent with initial HEP for pelvic floor strengthening    Time 4    Period Weeks    Status Achieved      PT SHORT TERM GOAL #2   Title understand how to perfrom urge to void exercises to reduce the amount of time she has to urinate at night    Time 4    Period Weeks    Status Achieved    Target Date 09/30/21               PT Long Term Goals - 11/25/21 1114       PT LONG TERM GOAL #1   Title Independent with advanced HEP for pelvic floor strength    Baseline still learning as she progressses    Time 12    Period Weeks    Status On-going    Target Date 11/25/21      PT LONG TERM  GOAL #2   Title able to delay the urge to void at night so she is urinating 2 times per night    Baseline gets up 4-5 times compared to 6-7 times    Time 12    Period Weeks    Status On-going    Target Date 11/25/21       PT LONG TERM GOAL #3   Title able to walk to the bathroom when she has the urge to urinate and not leak urine due to circular pelvic floor contraction at strength >/= 3/5    Baseline 60% less leakage    Time 12    Period Weeks    Status On-going    Target Date 11/25/21      PT LONG TERM GOAL #4   Title ability to lift items without urinary leakage due to improved pelvic floor strength >/= 3/5    Time 12    Period Weeks    Status Achieved    Target Date 11/25/21                   Plan - 11/25/21 1139     Clinical Impression Statement Patient is able to hold her urine 60% better as she is walking to the commode. Patient is getting up 4-5 times at night compared to 6-7 times. Patient is not leaking when she is lifting things. She continues to have trouble with controling the urge to urinate at night. Patient is not having pain in left hip today. Patient pelvic floor strength is 3/5. She deferred the therapist to do an internal assessment for pelvic floor strength today. Patient will benefit from skilled therapy to reduce urinary frequency and urgency.    Personal Factors and Comorbidities Comorbidity 2;Fitness    Comorbidities cervical fusion; osteoporosis    Examination-Activity Limitations Continence;Locomotion Level;Bed Mobility;Stand    Stability/Clinical Decision Making Stable/Uncomplicated    Rehab Potential Excellent    PT Frequency 1x / week    PT Duration 12 weeks    PT Treatment/Interventions ADLs/Self Care Home Management;Biofeedback;Therapeutic activities;Therapeutic exercise;Neuromuscular re-education;Patient/family education;Manual techniques    PT Next Visit Plan update HEP in standing, sitting and laying down    PT Home Exercise Plan Access Code: [email or web address]    Recommended Other Services sending renewal note to MD    Consulted and Agree with Plan of Care Patient             Patient will benefit from skilled therapeutic intervention in order to improve  the following deficits and impairments:  Decreased coordination, Decreased endurance, Decreased strength, Decreased activity tolerance  Visit Diagnosis: Other lack of coordination  Muscle weakness (generalized)  Urge incontinence     Problem List Patient Active Problem List   Diagnosis Date Noted   Cervical disc disease with myelopathy 07/16/2016   Benign essential HTN 12/13/2015   Pneumococcal vaccine refused 01/09/2015   Depression screening 11/23/2013   Hypomagnesemia 04/06/2013   Osteoporosis 04/06/2013   Asthma 02/17/2013   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   B-complex deficiency 02/17/2013   Diverticulosis of colon 02/17/2013   Dysphagia 02/17/2013   Internal hemorrhoids with complication 02/17/2013   Low back pain 02/17/2013   Neck pain 02/17/2013   Obstructive sleep apnea 02/17/2013   Osteoarthrosis 02/17/2013   Pernicious anemia 02/17/2013   Femoral fracture (HCC) 08/27/2012   Tobacco abuse 08/27/2012   Diabetes (HCC) 08/27/2012   Hyperlipidemia 08/27/2012   PAF (paroxysmal atrial fibrillation) (HCC) 08/27/2012   COPD (chronic obstructive pulmonary disease) (HCC)  08/27/2012   GERD (gastroesophageal reflux disease) 08/27/2012   Vitamin deficiency 08/27/2012   Tobacco dependence syndrome 08/27/2012   Carpal tunnel syndrome 02/03/2012   Vitamin B12 deficiency anemia 11/15/2010    Eulis Foster, PT 11/25/21 11:45 AM  [facility] @ [facility] [address] [place], [place], [address] Phone: [phone]   Fax:  [phone]  Name: Caprisha Bridgett MRN: [id number] Date of Birth: 1955-11-02

## 2021-11-25 NOTE — Telephone Encounter (Signed)
Jan really sorry for the short notice. It is not always easy to tell if a patient has a difficult airway and let her know our anesthesia team caught this from review of old records, she should have been notified in the past by an anesthesiologist when this was documented. ?Unfortunately this prohibits her from having anesthesia in the outpatient setting moving forward. Her case will be rescheduled at the hospital, but it is not urgent and I think okay to wait a few months to do this in next opening. You can book her in May if there is room on that day yet, or in that spot in April if that is not yet taken. Apologize about this occurrence and late notice but unfortunately due to the airway issue it is something that we have very little control over. Thanks ?

## 2021-11-29 ENCOUNTER — Encounter: Payer: Medicare Other | Admitting: Gastroenterology

## 2021-12-03 ENCOUNTER — Other Ambulatory Visit: Payer: Self-pay

## 2021-12-03 ENCOUNTER — Telehealth: Payer: Self-pay

## 2021-12-03 DIAGNOSIS — K219 Gastro-esophageal reflux disease without esophagitis: Secondary | ICD-10-CM

## 2021-12-03 DIAGNOSIS — K31A0 Gastric intestinal metaplasia, unspecified: Secondary | ICD-10-CM

## 2021-12-03 NOTE — Telephone Encounter (Signed)
Patient agreed and will arrange a ride with her daughter.  Would like to be notified if anyone cancels sooner.  ?

## 2021-12-03 NOTE — Telephone Encounter (Signed)
Called patient to offer her an opening on Dr. Lanetta Inch hospital schedule - Thursday, May 18th  ?

## 2021-12-03 NOTE — Progress Notes (Signed)
Patient needs an EGD at [facility] for GERD and Gastric intestinal metaplasia. Scheduled for [facility] on 5-18 ?

## 2021-12-05 ENCOUNTER — Ambulatory Visit: Payer: Medicaid Other | Admitting: Endocrinology

## 2021-12-06 ENCOUNTER — Ambulatory Visit: Payer: Medicare Other | Admitting: Endocrinology

## 2021-12-09 ENCOUNTER — Encounter: Payer: Self-pay | Admitting: Endocrinology

## 2021-12-09 ENCOUNTER — Other Ambulatory Visit: Payer: Self-pay

## 2021-12-09 ENCOUNTER — Ambulatory Visit (INDEPENDENT_AMBULATORY_CARE_PROVIDER_SITE_OTHER): Payer: Medicare Other | Admitting: Endocrinology

## 2021-12-09 VITALS — BP 118/64 | HR 78 | Ht 66.0 in | Wt 125.0 lb

## 2021-12-09 DIAGNOSIS — E049 Nontoxic goiter, unspecified: Secondary | ICD-10-CM

## 2021-12-09 LAB — TSH: TSH: 0.39 u[IU]/mL (ref 0.35–5.50)

## 2021-12-09 LAB — T4, FREE: Free T4: 0.92 ng/dL (ref 0.60–1.60)

## 2021-12-09 LAB — T3, FREE: T3, Free: 3.2 pg/mL (ref 2.3–4.2)

## 2021-12-09 NOTE — Progress Notes (Signed)
Patient ID: Tina [name], female   DOB: 12/20/1955, 66 y.o.   MRN: [id number] ?       ? ? ?Reason for Appointment: Follow-up of thyroid ? ? ? History of Present Illness:  ? ? ?The patient's thyroid abnormality was first discovered in 03/2018 ?She apparently was having a routine exam and screening TSH was low ?This was repeated and was low again at 0.22 by PCP ? ?On her initial consultation in 11/19 TSH was only borderline at 0.34 ?She was found to have a right-sided thyroid enlargement ?Subsequently she had a thyroid scan in 08/2018 ? ?This did not show any abnormal uptake pattern in the thyroid ? ?Still has some cramping in the left neck area which is chronic ?No shakiness, change in appetite or palpitations ?She is not clear why she has lost weight since last year ? ?Thyroid levels have been in the last couple of years quite normal ? ?Lab Results  ?Component Value Date  ? TSH 0.47 12/06/2020  ? TSH 0.46 12/01/2019  ? TSH 0.42 12/02/2018  ? FREET4 0.96 12/06/2020  ? FREET4 0.89 12/01/2019  ? FREET4 0.93 12/02/2018  ? ?Wt Readings from Last 3 Encounters:  ?12/09/21 125 lb (56.7 kg)  ?10/25/21 125 lb (56.7 kg)  ?12/06/20 137 lb 9.6 oz (62.4 kg)  ?  ? ? ? ?Allergies as of 12/09/2021   ? ?   Reactions  ? Hydrocodone-acetaminophen Palpitations, Other (See Comments)  ? Other reaction(s): Dizziness  chest pain  ? Tramadol Other (See Comments)  ? "It makes my chest feel funny; I don't like to take that"  ? Cortizone-10 [hydrocortisone]   ? Shots- Headaches  ? Vicodin [hydrocodone-acetaminophen]   ? Lisinopril Other (See Comments)  ? cough  ? ?  ? ?  ?Medication List  ?  ? ?  ? Accurate as of December 09, 2021  1:08 PM. If you have any questions, ask your nurse or doctor.  ?  ?  ? ?  ? ?acetaminophen 500 MG tablet ?Commonly known as: TYLENOL ?Take 500 mg by mouth every 6 (six) hours as needed. ?  ?alendronate 70 MG tablet ?Commonly known as: FOSAMAX ?Take 70 mg by mouth once a week. Take with a full glass of water on an  empty stomach. ?  ?amLODipine 10 MG tablet ?Commonly known as: NORVASC ?Take 10 mg by mouth daily. ?  ?cetirizine 10 MG tablet ?Commonly known as: ZYRTEC ?Take 10 mg by mouth daily. ?  ?cholecalciferol 25 MCG (1000 UNIT) tablet ?Commonly known as: VITAMIN D ?Take by mouth 2 (two) times daily. ?  ?Vitamin D3 25 MCG (1000 UT) Caps ?Take 2 capsules by mouth daily. ?  ?famotidine 20 MG tablet ?Commonly known as: PEPCID ?Take 1 tablet (20 mg total) by mouth 2 (two) times daily as needed. ?  ?gabapentin 300 MG capsule ?Commonly known as: NEURONTIN ?TAKE 1 CAPSULE BY MOUTH TWICE A DAY ?  ?lovastatin 20 MG tablet ?Commonly known as: MEVACOR ?Take 20 mg by mouth at bedtime. ?  ?valsartan 160 MG tablet ?Commonly known as: DIOVAN ?Take 160 mg by mouth daily. for high blood pressure ?  ? ?  ? ? ?Allergies:  ?Allergies  ?Allergen Reactions  ? Hydrocodone-Acetaminophen Palpitations and Other (See Comments)  ?  Other reaction(s): Dizziness  chest pain  ? Tramadol Other (See Comments)  ?  "It makes my chest feel funny; I don't like to take that"  ? Cortizone-10 [Hydrocortisone]   ?  Shots- Headaches ?  ?  Vicodin [Hydrocodone-Acetaminophen]   ? Lisinopril Other (See Comments)  ?  cough  ? ? ?Past Medical History:  ?Diagnosis Date  ? Arthritis   ? "joints; fingers; shoulders; knees; back" (08/27/2012)  ? Chronic back pain   ? epidural injections  ? Chronic neck pain   ? myelopathy  ? COPD (chronic obstructive pulmonary disease) (HCC)   ? Fibromyalgia   ? "dx'd long time ago; before they really knew what it was" (112/02/2012)  ? Gastric ulcer   ? GERD (gastroesophageal reflux disease)   ? takes Protonix daily  ? Heart murmur   ? History of bronchitis several yrs ago  ? History of colon polyps   ? Hyperlipidemia   ? takes Lovastatin daily  ? Hypertension   ? takes Losartan daily  ? Hypertensive retinopathy   ? OU  ? Joint swelling   ? Osteoporosis   ? Palpitations   ? takes Atenolol daily  ? Pneumonia 2013  ? hx of  ? Prediabetes   ?  Scoliosis   ? Sleep apnea   ? "have a mask but I don't wear it" (08/27/2012)  ? Weakness   ? numbness in hands and feet  ? ? ?Past Surgical History:  ?Procedure Laterality Date  ? ANTERIOR CERVICAL DECOMP/DISCECTOMY FUSION N/A 07/16/2016  ? Procedure: ANTERIOR CERVICAL DECOMPRESSION FUSION CERVICAL 3-4, CERVICAL 4-5, CERVICAL 5-6 WITH INSTRUMENATION AND ALLOGRAFT;  Surgeon: Estill Bamberg, MD;  Location: [facility] OR;  Service: Orthopedics;  Laterality: N/A;  ANTERIOR CERVICAL DECOMPRESSION FUSION CERVICAL 3-4, CERVICAL 4-5, CERVICAL 5-6 WITH INSTRUMENATION AND ALLOGRAFT  ? CATARACT EXTRACTION Right 05/18/2017  ? Dr. Marcell Anger  ? CATARACT EXTRACTION Left uknown  ? CATARACT EXTRACTION, BILATERAL  2018  ? COLONOSCOPY    ? ESOPHAGOGASTRODUODENOSCOPY    ? EYE SURGERY Bilateral   ? Cat Sx  ? fatty tumor removed from right leg    ? as a teenager  ? HIP ARTHROPLASTY  08/27/2012  ? Procedure: ARTHROPLASTY BIPOLAR HIP;  Surgeon: Velna Ochs, MD;  Location: [facility] OR;  Service: Orthopedics;  Laterality: Left;  ? ROTATOR CUFF REPAIR    ? ? ?Family History  ?Problem Relation Age of Onset  ? Throat cancer Mother   ? Stroke Sister   ? Seizures Sister   ? Dementia Sister   ? Lung cancer Brother   ? Stroke Sister   ? Prostate cancer Father   ? Hypertension Father   ? Breast cancer Maternal Grandmother   ? Diabetes Other   ? Thyroid disease Neg Hx   ? ? ?Social History:  reports that she has been smoking cigarettes. She has a 15.00 pack-year smoking history. She has never used smokeless tobacco. She reports that she does not drink alcohol and does not use drugs. ? ? ?Review of Systems ? ?She is treated for hypertension and hypercholesterolemia ? ? Examination: ?  ?BP 118/64   Pulse 78   Ht 5\' 6"  (1.676 m)   Wt 125 lb (56.7 kg)   SpO2 98%   BMI 20.18 kg/m?  ? ? ?The thyroid is enlarged only on the right side, about 1.5-2 times normal and smooth, slightly firm ?No thyroid nodule palpable ? ?Left lobe is not enlarged ?There is no  lymphadenopathy in the neck.    ?Hands are not shaky or diaphoretic ? ?Biceps reflexes normal ? ? ?Assessment/Plan: ? ?History of autonomous thyroid function with right lobe enlargement, normal thyroid scan in the past ?Again no symptoms suggestive of hyperthyroidism but she  has lost weight since last year ? ?She does not have any objective signs of hyperthyroidism ? ?Right lobe as before is enlarged nearly 2 times normal ? ?Thyroid not palpable on the left ? ?Thyroid levels will be checked today and if normal she will follow-up in 1 year again ?  ? ?Reather Littler ?12/09/2021 ? ? ? ? ?

## 2021-12-11 ENCOUNTER — Telehealth: Payer: Self-pay

## 2021-12-11 NOTE — Telephone Encounter (Signed)
Patient called in wanting lab results from 12/09/21 ?

## 2021-12-23 ENCOUNTER — Encounter: Payer: Self-pay | Admitting: Physical Therapy

## 2021-12-23 ENCOUNTER — Encounter: Payer: Self-pay | Admitting: Neurology

## 2021-12-23 ENCOUNTER — Ambulatory Visit: Payer: Medicare Other | Attending: Urology | Admitting: Physical Therapy

## 2021-12-23 ENCOUNTER — Ambulatory Visit (INDEPENDENT_AMBULATORY_CARE_PROVIDER_SITE_OTHER): Payer: Medicare Other | Admitting: Neurology

## 2021-12-23 VITALS — BP 134/73 | HR 86 | Ht 66.0 in | Wt 126.0 lb

## 2021-12-23 DIAGNOSIS — R002 Palpitations: Secondary | ICD-10-CM | POA: Diagnosis not present

## 2021-12-23 DIAGNOSIS — M6281 Muscle weakness (generalized): Secondary | ICD-10-CM | POA: Insufficient documentation

## 2021-12-23 DIAGNOSIS — R278 Other lack of coordination: Secondary | ICD-10-CM | POA: Insufficient documentation

## 2021-12-23 DIAGNOSIS — M5000 Cervical disc disorder with myelopathy, unspecified cervical region: Secondary | ICD-10-CM

## 2021-12-23 DIAGNOSIS — N3941 Urge incontinence: Secondary | ICD-10-CM | POA: Diagnosis present

## 2021-12-23 MED ORDER — GABAPENTIN 300 MG PO CAPS: 300.0000 mg | ORAL_CAPSULE | Freq: Every day | ORAL | 3 refills | Status: DC

## 2021-12-23 NOTE — Progress Notes (Signed)
? ? ?Follow-up Visit ? ? ?Date: 12/23/21 ? ? ?Tina [name] ?MRN: [id number] ?DOB: Feb 10, 1956 ? ? ?Interim History: ?Tina [name] is a 66 y.o. 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  The patient was accompanied to the clinic by self. ? ?History of present illness: ?Starting in 2017, she began having numbness over the fingers, hands, abdomen, and legs, worse on the left side.  She also has some weakness in the hands and imbalance.  She walks unassisted and has not had any falls.  She had MRI cervical spine in 2017 which showed severe cervical canal stenosis with multilevel impingement worse at C3-5 with associated myelomalacia and underwent ACDF at C3-4, C4-5, C5-6 by Dr. Yevette Edwards.  Following surgery, the intensity of her numbness and tingling significantly improved, but did not completely resolve.  She continues to have numbness over the hands and legs, which is worse on the left side.  She occasionally has tingling and pain in the hands, worse during the winter months.  She has tried gabapentin which did not provide relief.   ?She is on disability since 2010 for her back.  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   ? ?UPDATE 11/16/2020:  She is here for 6 month follow-up.  She continues to have numbness in the hands, which is unchanged.  She has spells where her hands will feel frozen and cold.  She denies weakness and falls.  Her gastroenterologist started her on gabapentin 300mg  at bedtime and she has not noticed any change.  ? ?UPDATE 12/23/2021:  She is here for follow-up visit.   She continues to have mild onoing burning in the feet which is triggered by walking.  She denies pain or tingling in the hands.  She has constant numbness.  She takes gabapentin 300mg  at bedtime and has not skipped any doses, which provides relief.  She did not start  daytime dose due to concern that it may make her sleepy.  ? ?She complains of palpitations and is requesting referral to cardiology.  Palpitations can be present at rest and with exertion.  She has family history of heart disease and wants to be referred to cardiology.  ? ?Medications:  ?Current Outpatient Medications on File Prior to Visit  ?Medication Sig Dispense Refill  ? acetaminophen (TYLENOL) 500 MG tablet Take 500 mg by mouth every 6 (six) hours as needed.    ? alendronate (FOSAMAX) 70 MG tablet Take 70 mg by mouth once a week. Take with a full glass of water on an empty stomach.    ? amLODipine (NORVASC) 10 MG tablet Take 10 mg by mouth daily.    ? cetirizine (ZYRTEC) 10 MG tablet Take 10 mg by mouth daily.    ? Cholecalciferol (VITAMIN D3) 25 MCG (1000 UT) CAPS Take 2 capsules by mouth daily.    ? famotidine (PEPCID) 20 MG tablet Take 1 tablet (20 mg total) by mouth 2 (two) times daily as needed. 180 tablet 1  ? gabapentin (NEURONTIN) 300 MG capsule TAKE 1 CAPSULE BY MOUTH TWICE A DAY 180 capsule 0  ? lovastatin (MEVACOR) 20 MG tablet Take 20 mg by mouth at bedtime.    ? valsartan (DIOVAN) 160 MG tablet Take 160 mg by mouth daily. for high blood pressure  1  ? ?No current facility-administered medications on file prior to visit.  ? ? ?Allergies:  ?  Allergies  ?Allergen Reactions  ? Hydrocodone-Acetaminophen Palpitations and Other (See Comments)  ?  Other reaction(s): Dizziness  chest pain  ? Tramadol Other (See Comments)  ?  "It makes my chest feel funny; I don't like to take that"  ? Cortizone-10 [Hydrocortisone]   ?  Shots- Headaches ?  ? Vicodin [Hydrocodone-Acetaminophen]   ? Lisinopril Other (See Comments)  ?  cough  ? ? ?Vital Signs:  ?BP 134/73   Pulse 86   Ht 5\' 6"  (1.676 m)   Wt 126 lb (57.2 kg)   SpO2 97%   BMI 20.34 kg/m?  ? ?General Medical Exam:   ?General:  Thin appearing, comfortable  ?Eyes/ENT: see cranial nerve examination.   ?Neck:   No carotid bruits. ?Respiratory:  Clear to  auscultation, good air entry bilaterally.   ?Cardiac:  Regular rate and rhythm, no murmur.   ?Ext:  No edema ? ?Neurological Exam: ?MENTAL STATUS including orientation to time, place, person, recent and remote memory, attention span and concentration, language, and fund of knowledge is normal.  Speech is not dysarthric. ? ?CRANIAL NERVES:   Pupils equal round and reactive to light.  Normal conjugate, extra-ocular eye movements in all directions of gaze.  No ptosis.  ? ?MOTOR:  Motor strength is 5/5 in all extremities, except 5-/5 bilateral hands.  No atrophy, fasciculations or abnormal movements.  No pronator drift.  Tone is normal.   ? ?MSRs:  Reflexes are 3+/4 throughout except 2+/4 at the ankles. ? ?SENSORY:  Intact to vibration throughout. ? ?COORDINATION/GAIT:  Normal finger-to- nose-finger.  Gait narrow based and stable. Stressed and tandem gait intact. ? ?Data: ?MRI cervical spine wwo contrast 05/05/2020: ?1. Prior ACDF at C3 through C6 without residual spinal stenosis. ?2. Small central to right paracentral disc protrusion at C2-3 without significant spinal stenosis or cord deformity. ?3. Mild disc bulge at C6-7 without stenosis or impingement. ?4. Small focus of chronic myelomalacia at the level of C4-5. ?  ? 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  Patient aware that symptoms will be permanent deficits. Fortunately, she gets relief of paresthesias with low dose gabapentin.   ?- Continue gabapentin 300mg  at bedtime ? ?2. Palpitations ? - I will place a referral to cardiology, per patient's request ? ?Return to clinic 1 year ? ? ? ?Thank you for allowing me to participate in patient's care.  If I can answer any additional questions, I would be pleased to do so.   ? ?Sincerely, ? ? ? ?Thursa Emme K. 07-12-2005, DO ? ? ?

## 2021-12-23 NOTE — Therapy (Signed)
[place] ?[facility] ?[address] ?[place], [place], [address] ?Phone: [phone]   Fax:  [phone] ? ?Physical Therapy Treatment ? ?Patient Details  ?Name: Tina [name] ?MRN: [id number] ?Date of Birth: 22-May-1956 ?Referring Provider (PT): Dr. Kasandra Knudsen ? ? ?Encounter Date: 12/23/2021 ? ? PT End of Session - 12/23/21 1619   ? ? Visit Number 7   ? Date for PT Re-Evaluation 02/17/22   ? Authorization Type Medicare and Medicaid   ? Authorization - Visit Number 7   ? Authorization - Number of Visits 10   ? PT Start Time 1615   ? PT Stop Time 1655   ? PT Time Calculation (min) 40 min   ? Activity Tolerance Patient tolerated treatment well   ? Behavior During Therapy [facility] for tasks assessed/performed   ? ?  ?  ? ?  ? ? ?Past Medical History:  ?Diagnosis Date  ? Arthritis   ? "joints; fingers; shoulders; knees; back" (08/27/2012)  ? Chronic back pain   ? epidural injections  ? Chronic neck pain   ? myelopathy  ? COPD (chronic obstructive pulmonary disease) (HCC)   ? Fibromyalgia   ? "dx'd long time ago; before they really knew what it was" (112/02/2012)  ? Gastric ulcer   ? GERD (gastroesophageal reflux disease)   ? takes Protonix daily  ? Heart murmur   ? History of bronchitis several yrs ago  ? History of colon polyps   ? Hyperlipidemia   ? takes Lovastatin daily  ? Hypertension   ? takes Losartan daily  ? Hypertensive retinopathy   ? OU  ? Joint swelling   ? Osteoporosis   ? Palpitations   ? takes Atenolol daily  ? Pneumonia 2013  ? hx of  ? Prediabetes   ? Scoliosis   ? Sleep apnea   ? "have a mask but I don't wear it" (08/27/2012)  ? Weakness   ? numbness in hands and feet  ? ? ?Past Surgical History:  ?Procedure Laterality Date  ? ANTERIOR CERVICAL DECOMP/DISCECTOMY FUSION N/A 07/16/2016  ? Procedure: ANTERIOR CERVICAL DECOMPRESSION FUSION CERVICAL 3-4, CERVICAL 4-5, CERVICAL 5-6 WITH INSTRUMENATION AND ALLOGRAFT;  Surgeon: Estill Bamberg, MD;  Location: [facility]  OR;  Service: Orthopedics;  Laterality: N/A;  ANTERIOR CERVICAL DECOMPRESSION FUSION CERVICAL 3-4, CERVICAL 4-5, CERVICAL 5-6 WITH INSTRUMENATION AND ALLOGRAFT  ? CATARACT EXTRACTION Right 05/18/2017  ? Dr. Marcell Anger  ? CATARACT EXTRACTION Left uknown  ? CATARACT EXTRACTION, BILATERAL  2018  ? COLONOSCOPY    ? ESOPHAGOGASTRODUODENOSCOPY    ? EYE SURGERY Bilateral   ? Cat Sx  ? fatty tumor removed from right leg    ? as a teenager  ? HIP ARTHROPLASTY  08/27/2012  ? Procedure: ARTHROPLASTY BIPOLAR HIP;  Surgeon: Velna Ochs, MD;  Location: [facility] OR;  Service: Orthopedics;  Laterality: Left;  ? ROTATOR CUFF REPAIR    ? ? ?There were no vitals filed for this visit. ? ? Subjective Assessment - 12/23/21 1619   ? ? Subjective The urinary leakage is 75% better. Gets up 4 times per night and before it was 6 times. I am using the urge to void technique.   ? Patient Stated Goals reduce the amount of times she has to urinate at night.   ? Currently in Pain? No/denies   ? Multiple Pain Sites No   ? ?  ?  ? ?  ? ? ? ? ? [facility] PT Assessment -  12/23/21 0001   ? ?  ? Assessment  ? Medical Diagnosis urinary frequency, nocturia, urge incontinence, urinary urgency   ? Referring Provider (PT) Dr. Kasandra Knudsen   ? Onset Date/Surgical Date --   2 years  ? Prior Therapy none   ?  ? Precautions  ? Precautions Other (comment)   ? Precaution Comments osteoporosis   ?  ? Restrictions  ? Weight Bearing Restrictions No   ?  ? Prior Function  ? Level of Independence Independent   ? Leisure none   ?  ? Cognition  ? Overall Cognitive Status Within Functional Limits for tasks assessed   ? ?  ?  ? ?  ? ? ? ? ? ? ? ? ? ? ? ? ? Pelvic Floor Special Questions - 12/23/21 0001   ? ? Biofeedback resting tone supine is 4uv, contract to 14 uv for 10 sec, can quickly contract to 18 uv with straight up and down. able to contract the pelvi cfloor with sit to stand, standing able to hold for 3 sec,; walking and pelvic floor contraction,   ? Biofeedback  sensor type Surface   vaginal  ? ?  ?  ? ?  ? ? ? ? [facility] Adult PT Treatment/Exercise - 12/23/21 0001   ? ?  ? Self-Care  ? Self-Care Other Self-Care Comments   ? Other Self-Care Comments  educated patient on working with the urge to void, when walking to the bathroom to stop and engage the muscles due to fatique, when the water is running then contract the pelvic floor   ? ?  ?  ? ?  ? ? ? ? ? ? ? ? ? ? PT Education - 12/23/21 1656   ? ? Education Details Access Code: [email or web address]   ? Person(s) Educated Patient   ? Methods Explanation;Demonstration;Verbal cues;Handout   ? Comprehension Returned demonstration;Verbalized understanding   ? ?  ?  ? ?  ? ? ? PT Short Term Goals - 11/25/21 1114   ? ?  ? PT SHORT TERM GOAL #1  ? Title Patient independent with initial HEP for pelvic floor strengthening   ? Time 4   ? Period Weeks   ? Status Achieved   ?  ? PT SHORT TERM GOAL #2  ? Title understand how to perfrom urge to void exercises to reduce the amount of time she has to urinate at night   ? Time 4   ? Period Weeks   ? Status Achieved   ? Target Date 09/30/21   ? ?  ?  ? ?  ? ? ? ? PT Long Term Goals - 12/23/21 1703   ? ?  ? PT LONG TERM GOAL #1  ? Title Independent with advanced HEP for pelvic floor strength   ? Baseline still learning as she progressses   ? Time 12   ? Period Weeks   ? Status On-going   ?  ? PT LONG TERM GOAL #2  ? Title able to delay the urge to void at night so she is urinating 2 times per night   ? Baseline gets up 4 times compared to 6-7 times   ? Time 12   ? Period Weeks   ? Status On-going   ? Target Date 11/25/21   ?  ? PT LONG TERM GOAL #3  ? Baseline 75% less leakage   ? Time 12   ? Period Weeks   ? Status On-going   ?  Target Date 11/25/21   ?  ? PT LONG TERM GOAL #4  ? Title ability to lift items without urinary leakage due to improved pelvic floor strength >/= 3/5   ? Time 12   ? Period Weeks   ? Status Achieved   ? Target Date 11/25/21   ? ?  ?  ? ?  ? ? ? ? ? ? ? ? Plan - 12/23/21 1622   ? ?  Clinical Impression Statement Patient reports her urinary leakage is 75% better. She gets up 4 times during the night instead of 6 times now. Patient is using the urge to void technique. Patient is able to contract the pelvic floor in supine for 10 seconds and standing for 5 sec. Educated patient on how to engage the pelvic floor muscles while walking to the bathroom due to the muscles fatiquing. She is able to quickly contract and relax her pelvic floor muscles in supine and standing. Patient will benefit from skilled therapy to reduce urinary leakage, frequency and urgency.   ? Personal Factors and Comorbidities Comorbidity 2;Fitness   ? Comorbidities cervical fusion; osteoporosis   ? Examination-Activity Limitations Continence;Locomotion Level;Bed Mobility;Stand   ? Stability/Clinical Decision Making Stable/Uncomplicated   ? Rehab Potential Excellent   ? PT Frequency 1x / week   ? PT Duration 12 weeks   ? PT Treatment/Interventions ADLs/Self Care Home Management;Biofeedback;Therapeutic activities;Therapeutic exercise;Neuromuscular re-education;Patient/family education;Manual techniques   ? PT Next Visit Plan pelvic floor EMG in standing   ? PT Home Exercise Plan Access Code: [email or web address]   ? Recommended Other Services MD signed all notes   ? Consulted and Agree with Plan of Care Patient   ? ?  ?  ? ?  ? ? ?Patient will benefit from skilled therapeutic intervention in order to improve the following deficits and impairments:  Decreased coordination, Decreased endurance, Decreased strength, Decreased activity tolerance ? ?Visit Diagnosis: ?Other lack of coordination ? ?Muscle weakness (generalized) ? ?Urge incontinence ? ? ? ? ?Problem List ?Patient Active Problem List  ? Diagnosis Date Noted  ? Cervical disc disease with myelopathy 07/16/2016  ? Benign essential HTN 12/13/2015  ? Pneumococcal vaccine refused 01/09/2015  ? Depression screening 11/23/2013  ? Hypomagnesemia 04/06/2013  ? Osteoporosis 04/06/2013  ? Asthma  02/17/2013  ? 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 B-complex deficiency 02/17/2013  ? Diverticulosis of colon 02/17/2013  ? Dysphagia 02/17/2013  ? Internal hemorrhoids with complication 02/17/2013  ? Low back pa

## 2021-12-23 NOTE — Patient Instructions (Signed)
Access Code: [email or web address] ?URL: [email or web address] ?Date: 12/23/2021 ?Prepared by: Eulis Foster ? ?Exercises ?-- Standing Pelvic Floor Contraction  - 3 x daily - 7 x weekly - 1 sets - 10 reps - 5 sec hold ?- Standing March  - 1 x daily - 3 x weekly - 2 sets - 10 reps ?[facility] ?[address], [address] ?[place], [place] [address] ?Phone # [phone] ?Fax [phone] ? ?

## 2021-12-24 ENCOUNTER — Encounter: Payer: Medicare Other | Admitting: Physical Therapy

## 2021-12-24 NOTE — Telephone Encounter (Signed)
Patient called in requesting labs to be sent. Labs sent to patient ? ?

## 2022-01-13 ENCOUNTER — Encounter: Payer: Medicare Other | Admitting: Physical Therapy

## 2022-01-17 ENCOUNTER — Ambulatory Visit: Payer: Medicare Other | Admitting: Cardiology

## 2022-01-27 ENCOUNTER — Ambulatory Visit: Payer: Medicare Other | Admitting: Cardiology

## 2022-01-27 ENCOUNTER — Encounter: Payer: Self-pay | Admitting: Cardiology

## 2022-01-27 VITALS — BP 131/65 | HR 91 | Temp 97.0°F | Resp 16 | Ht 66.0 in | Wt 127.0 lb

## 2022-01-27 DIAGNOSIS — I10 Essential (primary) hypertension: Secondary | ICD-10-CM

## 2022-01-27 DIAGNOSIS — E782 Mixed hyperlipidemia: Secondary | ICD-10-CM

## 2022-01-27 DIAGNOSIS — E119 Type 2 diabetes mellitus without complications: Secondary | ICD-10-CM

## 2022-01-27 DIAGNOSIS — F17210 Nicotine dependence, cigarettes, uncomplicated: Secondary | ICD-10-CM

## 2022-01-27 DIAGNOSIS — R072 Precordial pain: Secondary | ICD-10-CM

## 2022-01-27 DIAGNOSIS — R002 Palpitations: Secondary | ICD-10-CM

## 2022-01-27 DIAGNOSIS — R0989 Other specified symptoms and signs involving the circulatory and respiratory systems: Secondary | ICD-10-CM

## 2022-01-27 DIAGNOSIS — I2510 Atherosclerotic heart disease of native coronary artery without angina pectoris: Secondary | ICD-10-CM

## 2022-01-27 MED ORDER — ASPIRIN EC 81 MG PO TBEC: 81.0000 mg | DELAYED_RELEASE_TABLET | Freq: Every day | ORAL | 11 refills | Status: DC

## 2022-01-27 NOTE — Progress Notes (Signed)
? ?ID:  Tina [name], DOB 04-20-1956, MRN [id number] ? ?PCP:  Driggers, Hewitt Shorts, PA-C  ?Cardiologist:  Rex Kras, DO, [facility] (established care 01/27/2022) ? ?REASON FOR CONSULT: Palpitations ? ?REQUESTING PHYSICIAN:  ?Dr. Narda Amber ?[id number],  ?[place], [place] [address] ? ?Chief Complaint  ?Patient presents with  ? Palpitations  ? Chest Pain  ? ? ?HPI  ?Tina [name] is a 66 y.o. African-American female whose past medical history and cardiovascular risk factors include: Coronary artery calcification (nongated CT study), hypertension, GERD, cervical myelopathy status post ACDF C3-C6, cigarette smoker (0.5ppd). ? ?She is referred to the office at the request of Dr. Narda Amber for evaluation of palpitations. ? ?Patient recently had an appointment with her neurologist and voiced concerns of palpitations and therefore referred to cardiology for further evaluation and management.  Patient states that the symptoms have been present for the last several years but recently have been more noticeable.  Brought on usually after walking at a fast pace than usual or postexertional activity.  Denies any syncope or near syncopal events.  No consumption of coffee, illicit drugs, alcohol, herbal supplements or over-the-counter medications which are new.  She drinks 1 Sprite per day.  And has family history of A-fib.  Upon reviewing records in [place] she has a documented history of paroxysmal atrial fibrillation which she does not endorse and therefore the accuracy is uncertain. ? ?Review of systems also positive chest pain.  Patient describes the discomfort as tightness/pressure-like sensation over the left precordium, at times brought on by effort related activities and resolves with rest.  Symptoms have been present for the last 3 years without any significant change in intensity, frequency, and/or duration.  The symptoms last for approximately 2 hours and usually resolve after taking Tylenol.   Review of records from [place] notes presence of coronary artery calcification on nongated CT study. ? ?FUNCTIONAL STATUS: ?No structured exercise program or daily routine.  Limited by arthritis of the hip. ? ?ALLERGIES: ?Allergies  ?Allergen Reactions  ? Hydrocodone-Acetaminophen Palpitations and Other (See Comments)  ?  Other reaction(s): Dizziness  chest pain  ? Tramadol Other (See Comments)  ?  "It makes my chest feel funny; I don't like to take that"  ? Cortizone-10 [Hydrocortisone]   ?  Shots- Headaches ?  ? Vicodin [Hydrocodone-Acetaminophen]   ? Lisinopril Other (See Comments)  ?  cough  ? ? ?MEDICATION LIST PRIOR TO VISIT: ?Current Meds  ?Medication Sig  ? acetaminophen (TYLENOL) 500 MG tablet Take 500 mg by mouth every 6 (six) hours as needed.  ? alendronate (FOSAMAX) 10 MG tablet Take 10 mg by mouth as needed.  ? amLODipine (NORVASC) 10 MG tablet Take 10 mg by mouth daily.  ? cetirizine (ZYRTEC) 10 MG tablet Take 10 mg by mouth daily.  ? Cholecalciferol (VITAMIN D3) 25 MCG (1000 UT) CAPS Take 2 capsules by mouth daily.  ? famotidine (PEPCID) 20 MG tablet Take 1 tablet (20 mg total) by mouth 2 (two) times daily as needed.  ? gabapentin (NEURONTIN) 300 MG capsule Take 1 capsule (300 mg total) by mouth at bedtime.  ? lovastatin (MEVACOR) 20 MG tablet Take 20 mg by mouth at bedtime.  ? valsartan (DIOVAN) 160 MG tablet Take 160 mg by mouth daily. for high blood pressure  ? aspirin EC 81 MG tablet Take 1 tablet (81 mg total) by mouth daily. Swallow whole.  ?  ? ?PAST MEDICAL HISTORY: ?Past Medical History:  ?Diagnosis  Date  ? Arthritis   ? "joints; fingers; shoulders; knees; back" (08/27/2012)  ? Chronic back pain   ? epidural injections  ? Chronic neck pain   ? myelopathy  ? COPD (chronic obstructive pulmonary disease) ([place])   ? Fibromyalgia   ? "dx'd long time ago; before they really knew what it was" (112/02/2012)  ? Gastric ulcer   ? GERD (gastroesophageal reflux disease)   ? takes Protonix daily  ?  Heart murmur   ? History of bronchitis several yrs ago  ? History of colon polyps   ? Hyperlipidemia   ? takes Lovastatin daily  ? Hypertension   ? takes Losartan daily  ? Hypertensive retinopathy   ? OU  ? Joint swelling   ? Osteoporosis   ? Palpitations   ? takes Atenolol daily  ? Pneumonia 2013  ? hx of  ? Prediabetes   ? Scoliosis   ? Sleep apnea   ? "have a mask but I don't wear it" (08/27/2012)  ? Weakness   ? numbness in hands and feet  ? ? ?PAST SURGICAL HISTORY: ?Past Surgical History:  ?Procedure Laterality Date  ? ANTERIOR CERVICAL DECOMP/DISCECTOMY FUSION N/A 07/16/2016  ? Procedure: ANTERIOR CERVICAL DECOMPRESSION FUSION CERVICAL 3-4, CERVICAL 4-5, CERVICAL 5-6 WITH INSTRUMENATION AND ALLOGRAFT;  Surgeon: Phylliss Bob, MD;  Location: [facility];  Service: Orthopedics;  Laterality: N/A;  ANTERIOR CERVICAL DECOMPRESSION FUSION CERVICAL 3-4, CERVICAL 4-5, CERVICAL 5-6 WITH INSTRUMENATION AND ALLOGRAFT  ? CATARACT EXTRACTION Right 05/18/2017  ? Dr. Leanora Ivanoff  ? CATARACT EXTRACTION Left uknown  ? CATARACT EXTRACTION, BILATERAL  2018  ? COLONOSCOPY    ? ESOPHAGOGASTRODUODENOSCOPY    ? EYE SURGERY Bilateral   ? Cat Sx  ? fatty tumor removed from right leg    ? as a teenager  ? HIP ARTHROPLASTY  08/27/2012  ? Procedure: ARTHROPLASTY BIPOLAR HIP;  Surgeon: Hessie Dibble, MD;  Location: [facility];  Service: Orthopedics;  Laterality: Left;  ? ROTATOR CUFF REPAIR    ? ROTATOR CUFF REPAIR Left   ? ? ?FAMILY HISTORY: ?The patient family history includes Breast cancer in her maternal grandmother; Dementia in her sister; Diabetes in an other family member; Hypertension in her father; Lung cancer in her brother; Prostate cancer in her father; Seizures in her sister; Stroke in her sister and sister; Throat cancer in her mother. ? ?SOCIAL HISTORY:  ?The patient  reports that she has been smoking cigarettes. She has a 15.00 pack-year smoking history. She has never used smokeless tobacco. She reports that she does not drink  alcohol and does not use drugs. ? ?REVIEW OF SYSTEMS: ?Review of Systems  ?Cardiovascular:  Positive for chest pain and palpitations. Negative for dyspnea on exertion, leg swelling, near-syncope, orthopnea, paroxysmal nocturnal dyspnea and syncope.  ? ?PHYSICAL EXAM: ? ?  01/27/2022  ? 12:38 PM 12/23/2021  ?  1:24 PM 12/09/2021  ? 12:59 PM  ?Vitals with BMI  ?Height 5\' 6"  5\' 6"  5\' 6"   ?Weight 127 lbs 126 lbs 125 lbs  ?BMI 20.51 20.35 20.19  ?Systolic [id number]  ?Diastolic 65 73 64  ?Pulse 91 86 78  ? ? ?CONSTITUTIONAL: Well-developed and well-nourished. No acute distress.  ?SKIN: Skin is warm and dry. No rash noted. No cyanosis. No pallor. No jaundice ?HEAD: Normocephalic and atraumatic.  ?EYES: No scleral icterus ?MOUTH/THROAT: Moist oral membranes.  ?NECK: No JVD present. No thyromegaly noted.  Bilateral carotid bruits  ?CHEST Normal respiratory effort. No intercostal retractions  ?  LUNGS: Decreased breath sounds bilaterally, no stridor. No wheezes. No rales.  ?CARDIOVASCULAR: Regular, tachycardic, positive S1-S2, no murmurs rubs or gallops appreciated. ?ABDOMINAL: Soft, nontender, nondistended, positive bowel sounds in all 4 quadrants, no apparent ascites.  ?EXTREMITIES: No peripheral edema, warm to touch. ?HEMATOLOGIC: No significant bruising ?NEUROLOGIC: Oriented to person, place, and time. Nonfocal. Normal muscle tone.  ?PSYCHIATRIC: Normal mood and affect. Normal behavior. Cooperative ? ?RADIOLOGY: ?CT of the chest without contrast lung cancer screening (Care Everywhere 08/14/2021): ?Per report coronary artery calcification, no pericardial effusion, heart normal size, normal caliber thoracic aorta ? ?CARDIAC DATABASE: ?EKG: ?01/27/2022: NSR, 77bpm, without underlying injury pattern.  ? ?Echocardiogram: ?No results found for this or any previous visit from the past 1095 days. ?  ?Stress Testing: ?06/30/2016 Care Everywhere: ?- Normal myocardial perfusion study  ?- No evidence for significant ischemia or scar is  noted.  ?- Post stress: Global systolic function is normal. The ejection fraction was greater than 65%.  ?- Minimal coronary calcifications are noted  ? ?Heart Catheterization: ?None ? ?LABORATORY DATA: ?

## 2022-01-28 ENCOUNTER — Other Ambulatory Visit: Payer: Self-pay

## 2022-01-28 DIAGNOSIS — T884XXS Failed or difficult intubation, sequela: Secondary | ICD-10-CM

## 2022-01-28 DIAGNOSIS — K31A0 Gastric intestinal metaplasia, unspecified: Secondary | ICD-10-CM

## 2022-01-28 NOTE — Progress Notes (Signed)
PATIENT HAS BEEN MAILED INSTRUCTIONS FOR EGD AT [facility] ON 5-18 WITH Dr. Adela Lank ?

## 2022-01-30 ENCOUNTER — Encounter (HOSPITAL_COMMUNITY): Payer: Self-pay | Admitting: Gastroenterology

## 2022-01-30 NOTE — Progress Notes (Signed)
Attempted to obtain medical history via telephone, unable to reach at this time. I left a voicemail to return pre surgical testing department's phone call.  

## 2022-01-31 ENCOUNTER — Other Ambulatory Visit: Payer: Medicare Other

## 2022-02-05 NOTE — Anesthesia Preprocedure Evaluation (Addendum)
Anesthesia Evaluation  Patient identified by MRN, date of birth, ID band Patient awake    Reviewed: Allergy & Precautions, NPO status , Patient's Chart, lab work & pertinent test results  Airway Mallampati: II  TM Distance: >3 FB Neck ROM: Full    Dental no notable dental hx. (+) Teeth Intact, Dental Advisory Given   Pulmonary sleep apnea , Current Smoker and Patient abstained from smoking.,    Pulmonary exam normal breath sounds clear to auscultation       Cardiovascular hypertension, Pt. on medications Normal cardiovascular exam Rhythm:Regular Rate:Normal     Neuro/Psych Cervical myelopathy S/P ACDF C3-C6  Neuromuscular disease    GI/Hepatic Neg liver ROS, PUD, GERD  ,  Endo/Other  diabetes  Renal/GU      Musculoskeletal  (+) Arthritis ,   Abdominal   Peds  Hematology  (+) Blood dyscrasia, anemia ,   Anesthesia Other Findings Tramadol, Vicodin, Lisinopril  Reproductive/Obstetrics                            Anesthesia Physical Anesthesia Plan  ASA: 3  Anesthesia Plan: MAC   Post-op Pain Management:    Induction:   PONV Risk Score and Plan: Treatment may vary due to age or medical condition  Airway Management Planned: Natural Airway and Nasal Cannula  Additional Equipment:   Intra-op Plan:   Post-operative Plan:   Informed Consent: I have reviewed the patients History and Physical, chart, labs and discussed the procedure including the risks, benefits and alternatives for the proposed anesthesia with the patient or authorized representative who has indicated his/her understanding and acceptance.     Dental advisory given  Plan Discussed with: CRNA and Anesthesiologist  Anesthesia Plan Comments: (GERD for EGD)       Anesthesia Quick Evaluation

## 2022-02-06 ENCOUNTER — Other Ambulatory Visit: Payer: Self-pay

## 2022-02-06 ENCOUNTER — Encounter (HOSPITAL_COMMUNITY)
Admission: RE | Disposition: A | Discharge status: Home / Self Care | Payer: Self-pay | Source: Home / Self Care | Attending: Gastroenterology

## 2022-02-06 ENCOUNTER — Ambulatory Visit (HOSPITAL_COMMUNITY)
Admission: RE | Admit: 2022-02-06 | Discharge: 2022-02-06 | Disposition: A | Discharge status: Home / Self Care | Payer: Medicare Other | Attending: Gastroenterology | Admitting: Gastroenterology

## 2022-02-06 ENCOUNTER — Ambulatory Visit (HOSPITAL_BASED_OUTPATIENT_CLINIC_OR_DEPARTMENT_OTHER): Payer: Medicare Other | Admitting: Anesthesiology

## 2022-02-06 ENCOUNTER — Encounter (HOSPITAL_COMMUNITY): Payer: Self-pay | Admitting: Gastroenterology

## 2022-02-06 ENCOUNTER — Ambulatory Visit (HOSPITAL_COMMUNITY): Payer: Medicare Other | Admitting: Anesthesiology

## 2022-02-06 DIAGNOSIS — K3189 Other diseases of stomach and duodenum: Secondary | ICD-10-CM

## 2022-02-06 DIAGNOSIS — K2100 Gastro-esophageal reflux disease with esophagitis, without bleeding: Secondary | ICD-10-CM | POA: Insufficient documentation

## 2022-02-06 DIAGNOSIS — K31A0 Gastric intestinal metaplasia, unspecified: Secondary | ICD-10-CM

## 2022-02-06 DIAGNOSIS — I10 Essential (primary) hypertension: Secondary | ICD-10-CM | POA: Insufficient documentation

## 2022-02-06 DIAGNOSIS — K31A11 Gastric intestinal metaplasia without dysplasia, involving the antrum: Secondary | ICD-10-CM | POA: Insufficient documentation

## 2022-02-06 DIAGNOSIS — Z79899 Other long term (current) drug therapy: Secondary | ICD-10-CM | POA: Diagnosis not present

## 2022-02-06 DIAGNOSIS — F17210 Nicotine dependence, cigarettes, uncomplicated: Secondary | ICD-10-CM | POA: Diagnosis not present

## 2022-02-06 DIAGNOSIS — E119 Type 2 diabetes mellitus without complications: Secondary | ICD-10-CM | POA: Diagnosis not present

## 2022-02-06 DIAGNOSIS — M797 Fibromyalgia: Secondary | ICD-10-CM | POA: Diagnosis not present

## 2022-02-06 DIAGNOSIS — G709 Myoneural disorder, unspecified: Secondary | ICD-10-CM | POA: Diagnosis not present

## 2022-02-06 DIAGNOSIS — K219 Gastro-esophageal reflux disease without esophagitis: Secondary | ICD-10-CM

## 2022-02-06 HISTORY — PX: ESOPHAGOGASTRODUODENOSCOPY (EGD) WITH PROPOFOL: SHX5813

## 2022-02-06 HISTORY — PX: BIOPSY: SHX5522

## 2022-02-06 SURGERY — ESOPHAGOGASTRODUODENOSCOPY (EGD) WITH PROPOFOL
Anesthesia: Monitor Anesthesia Care

## 2022-02-06 MED ORDER — SODIUM CHLORIDE 0.9 % IV SOLN: INTRAVENOUS | Status: DC

## 2022-02-06 MED ORDER — LACTATED RINGERS IV SOLN: INTRAVENOUS | Status: DC

## 2022-02-06 MED ORDER — LIDOCAINE HCL 1 % IJ SOLN
INTRAMUSCULAR | Status: DC | PRN
  Administered 2022-02-06: 50 mg via INTRADERMAL

## 2022-02-06 MED ORDER — PROPOFOL 10 MG/ML IV BOLUS
INTRAVENOUS | Status: DC | PRN
  Administered 2022-02-06 (×2): 10 mg via INTRAVENOUS
  Administered 2022-02-06 (×2): 20 mg via INTRAVENOUS

## 2022-02-06 MED ORDER — PROPOFOL 500 MG/50ML IV EMUL
INTRAVENOUS | Status: DC | PRN
  Administered 2022-02-06: 110 ug/kg/min via INTRAVENOUS

## 2022-02-06 MED ORDER — PROPOFOL 500 MG/50ML IV EMUL
INTRAVENOUS | Status: AC
  Filled 2022-02-06: qty 50

## 2022-02-06 SURGICAL SUPPLY — 15 items

## 2022-02-06 NOTE — Transfer of Care (Signed)
Immediate Anesthesia Transfer of Care Note  Patient: Tina [name]  Procedure(s) Performed: ESOPHAGOGASTRODUODENOSCOPY (EGD) WITH PROPOFOL BIOPSY  Patient Location: PACU  Anesthesia Type:MAC  Level of Consciousness: awake, alert , oriented and patient cooperative  Airway & Oxygen Therapy: Patient Spontanous Breathing and Patient connected to face mask oxygen  Post-op Assessment: Report given to RN and Post -op Vital signs reviewed and stable  Post vital signs: Reviewed  Last Vitals:  Vitals Value Taken Time  BP    Temp    Pulse    Resp    SpO2      Last Pain:  Vitals:   02/06/22 0646  TempSrc: Temporal  PainSc: 0-No pain         Complications: No notable events documented.

## 2022-02-06 NOTE — Op Note (Signed)
[facility] Patient Name: Tina [name] Procedure Date: 02/06/2022 MRN: [id number] Attending MD: Tina [name] , MD Date of Birth: 1955/10/04 CSN: [id number] Age: 66 Admit Type: Outpatient Procedure:                Upper GI endoscopy Indications:              history of gastro-esophageal reflux disease - on                            PPI in the past, history of osteoporosis now on                            pepcid, hoping to avoid PPI if possible - rule out                            BE, Follow-up of gastric intestinal metaplasia                            noted on prior EGD in 2018 Providers:                Remo Lipps P. Havery Moros, MD, Allayne Gitelman, RN, William Dalton, Technician Referring MD:              Medicines:                Monitored Anesthesia Care Complications:            No immediate complications. Estimated blood loss:                            Minimal. Estimated Blood Loss:     Estimated blood loss was minimal. Procedure:                Pre-Anesthesia Assessment:                           - Prior to the procedure, a History and Physical                            was performed, and patient medications and                            allergies were reviewed. The patient's tolerance of                            previous anesthesia was also reviewed. The risks                            and benefits of the procedure and the sedation                            options and risks were discussed with the patient.  All questions were answered, and informed consent                            was obtained. Prior Anticoagulants: The patient has                            taken no previous anticoagulant or antiplatelet                            agents. ASA Grade Assessment: III - A patient with                            severe systemic disease. After reviewing the risks                            and  benefits, the patient was deemed in                            satisfactory condition to undergo the procedure.                           After obtaining informed consent, the endoscope was                            passed under direct vision. Throughout the                            procedure, the patient's blood pressure, pulse, and                            oxygen saturations were monitored continuously. The                            [id number]-H190 ([id number]) Olympus endoscope was introduced                            through the mouth, and advanced to the second part                            of duodenum. The upper GI endoscopy was                            accomplished without difficulty. The patient                            tolerated the procedure well. Scope In: Scope Out: Findings:      Esophagogastric landmarks were identified: the Z-line was found at 41       cm, the gastroesophageal junction was found at 41 cm and the upper       extent of the gastric folds was found at 41 cm from the incisors.      The exam of the esophagus was otherwise normal. No Barrett's esophagus,       no erosive esophagitis.      There was  some slight atrophy of the antrum, but otherwise the entire       examined stomach was normal. Biopsies were taken with a cold forceps for       histology to rule out GIM - taken from the antrum, incisura, body, and       fundus.      The duodenal bulb and second portion of the duodenum were normal. Impression:               - Esophagogastric landmarks identified.                           - Normal esophagus otherwise - no erosive change                           - Slight atrophy of the antum, otherwise normal                            stomach. Biopsied per gastric mapping protocol for                            history of gastric intestinal metaplasia.                           - Normal duodenal bulb and second portion of the                             duodenum.                           No erosive changes or Barrett's - overall pepcid                            seems to be working, would use this regularly and                            hope to avoid PPI unless symptoms persistent                            despite pepcid Moderate Sedation:      No moderate sedation, case performed with MAC Recommendation:           - Patient has a contact number available for                            emergencies. The signs and symptoms of potential                            delayed complications were discussed with the                            patient. Return to normal activities tomorrow.                            Written discharge instructions were provided to the  patient.                           - Resume previous diet.                           - Continue present medications.                           - Await pathology results with further                            recommendations Procedure Code(s):        --- Professional ---                           [phone], Esophagogastroduodenoscopy, flexible,                            transoral; with biopsy, single or multiple Diagnosis Code(s):        --- Professional ---                           K21.9, Gastro-esophageal reflux disease without                            esophagitis                           K31.89, Other diseases of stomach and duodenum CPT copyright 2019 [employer] Association. All rights reserved. The codes documented in this report are preliminary and upon coder review may  be revised to meet current compliance requirements. Remo Lipps P. Alexis Mizuno, MD 02/06/2022 7:56:59 AM This report has been signed electronically. Number of Addenda: 0

## 2022-02-06 NOTE — H&P (Signed)
[facility] History and Physical   Primary Care Physician:  Driggers, Gerome Apley, PA-C   Reason for Procedure:   GERD, history of GIM  Plan:    EGD     HPI: Tina [name] is a 66 y.o. female  here for EGD to further evaluate GERD - rule out BE, also with history of reported GIM on EGD in 2018. Nephew with gastric cancer. On pepcid for GERD and has had some breakthrough. Otherwise feels well without any cardiopulmonary symptoms. She is at the hospital today for EGD in light of reported difficult intubation in the past.   Past Medical History:  Diagnosis Date   Arthritis    "joints; fingers; shoulders; knees; back" (08/27/2012)   Chronic back pain    epidural injections   Chronic neck pain    myelopathy   COPD (chronic obstructive pulmonary disease) (HCC)    Fibromyalgia    "dx'd long time ago; before they really knew what it was" (112/02/2012)   Gastric ulcer    GERD (gastroesophageal reflux disease)    takes Protonix daily   Heart murmur    History of bronchitis several yrs ago   History of colon polyps    Hyperlipidemia    takes Lovastatin daily   Hypertension    takes Losartan daily   Hypertensive retinopathy    OU   Joint swelling    Osteoporosis    Palpitations    takes Atenolol daily   Pneumonia 2013   hx of   Prediabetes    Scoliosis    Sleep apnea    "have a mask but I don't wear it" (08/27/2012)   Weakness    numbness in hands and feet    Past Surgical History:  Procedure Laterality Date   ANTERIOR CERVICAL DECOMP/DISCECTOMY FUSION N/A 07/16/2016   Procedure: ANTERIOR CERVICAL DECOMPRESSION FUSION CERVICAL 3-4, CERVICAL 4-5, CERVICAL 5-6 WITH INSTRUMENATION AND ALLOGRAFT;  Surgeon: Estill Bamberg, MD;  Location: [facility] OR;  Service: Orthopedics;  Laterality: N/A;  ANTERIOR CERVICAL DECOMPRESSION FUSION CERVICAL 3-4, CERVICAL 4-5, CERVICAL 5-6 WITH INSTRUMENATION AND ALLOGRAFT   CATARACT EXTRACTION Right 05/18/2017   Dr. Marcell Anger    CATARACT EXTRACTION Left uknown   CATARACT EXTRACTION, BILATERAL  2018   COLONOSCOPY     ESOPHAGOGASTRODUODENOSCOPY     EYE SURGERY Bilateral    Cat Sx   fatty tumor removed from right leg     as a teenager   HIP ARTHROPLASTY  08/27/2012   Procedure: ARTHROPLASTY BIPOLAR HIP;  Surgeon: Velna Ochs, MD;  Location: [facility] OR;  Service: Orthopedics;  Laterality: Left;   ROTATOR CUFF REPAIR     ROTATOR CUFF REPAIR Left     Prior to Admission medications   Medication Sig Start Date End Date Taking? Authorizing Provider  acetaminophen (TYLENOL) 500 MG tablet Take 500 mg by mouth every 6 (six) hours as needed.   Yes [provider]  alendronate (FOSAMAX) 10 MG tablet Take 10 mg by mouth daily before breakfast. 12/23/21  Yes [provider]  amLODipine (NORVASC) 10 MG tablet Take 10 mg by mouth daily. 07/20/19  Yes [provider]  aspirin EC 81 MG tablet Take 1 tablet (81 mg total) by mouth daily. Swallow whole. 01/27/22  Yes Tolia, Sunit, DO  carboxymethylcellul-glycerin (REFRESH RELIEVA) 0.5-0.9 % ophthalmic solution Place 1 drop into both eyes as needed for dry eyes.   Yes [provider]  cetirizine (ZYRTEC) 10 MG tablet Take 10 mg by mouth daily as  needed for allergies. 11/21/19  Yes [provider]  Cholecalciferol (VITAMIN D3) 25 MCG (1000 UT) CAPS Take 1,000 Units by mouth daily. 10/29/21  Yes [provider]  famotidine (PEPCID) 20 MG tablet Take 1 tablet (20 mg total) by mouth 2 (two) times daily as needed. 10/25/21  Yes Marabella Popiel, Carlota Raspberry, MD  gabapentin (NEURONTIN) 300 MG capsule Take 1 capsule (300 mg total) by mouth at bedtime. 12/23/21  Yes Patel, Donika K, DO  lovastatin (MEVACOR) 20 MG tablet Take 20 mg by mouth at bedtime.   Yes [provider]  valsartan (DIOVAN) 160 MG tablet Take 160 mg by mouth daily. for high blood pressure 07/22/18  Yes [provider]    Current Facility-Administered Medications  Medication  Dose Route Frequency Provider Last Rate Last Admin   0.9 %  sodium chloride infusion   Intravenous Continuous Kalin Kyler, Carlota Raspberry, MD       lactated ringers infusion   Intravenous Continuous Irineo Gaulin, Carlota Raspberry, MD 10 mL/hr at 02/06/22 0657 New Bag at 02/06/22 0657    Allergies as of 12/03/2021 - Review Complete 11/25/2021  Allergen Reaction Noted   Hydrocodone-acetaminophen Palpitations and Other (See Comments) 01/18/2014   Tramadol Other (See Comments) 05/03/2016   Cortizone-10 [hydrocortisone]  02/26/2017   Vicodin [hydrocodone-acetaminophen]  07/23/2017   Lisinopril Other (See Comments) 12/13/2015    Family History  Problem Relation Age of Onset   Throat cancer Mother    Prostate cancer Father    Hypertension Father    Stroke Sister    Seizures Sister    Dementia Sister    Stroke Sister    Lung cancer Brother    Breast cancer Maternal Grandmother    Diabetes Other    Thyroid disease Neg Hx     Social History   Socioeconomic History   Marital status: Divorced    Spouse name: Not on file   Number of children: 2   Years of education: Not on file   Highest education level: Not on file  Occupational History   Not on file  Tobacco Use   Smoking status: Every Day    Packs/day: 0.50    Years: 30.00    Pack years: 15.00    Types: Cigarettes   Smokeless tobacco: Never  Vaping Use   Vaping Use: Never used  Substance and Sexual Activity   Alcohol use: No   Drug use: No   Sexual activity: Never  Other Topics Concern   Not on file  Social History Narrative   Right handed   Lives in single story home alone   Occasional caffeine intake   Social Determinants of Health   Financial Resource Strain: Not on file  Food Insecurity: Not on file  Transportation Needs: Not on file  Physical Activity: Not on file  Stress: Not on file  Social Connections: Not on file  Intimate Partner Violence: Not on file    Review of Systems: All other review of systems negative  except as mentioned in the HPI.  Physical Exam: Vital signs BP 129/61   Pulse 78   Temp 97.8 F (36.6 C) (Temporal)   Resp 19   Ht 5\' 5"  (1.651 m)   Wt 57.2 kg   SpO2 100%   BMI 20.97 kg/m   General:   Alert,  Well-developed, pleasant and cooperative in NAD Lungs:  Clear throughout to auscultation.   Heart:  Regular rate and rhythm Abdomen:  Soft, nontender and nondistended.   Neuro/Psych:  Alert and cooperative. Normal mood and affect. A and O x 3  Jolly Mango, MD [facility]

## 2022-02-06 NOTE — Anesthesia Postprocedure Evaluation (Signed)
Anesthesia Post Note  Patient: Tina [name]  Procedure(s) Performed: ESOPHAGOGASTRODUODENOSCOPY (EGD) WITH PROPOFOL BIOPSY     Patient location during evaluation: PACU Anesthesia Type: MAC Level of consciousness: awake and alert Pain management: pain level controlled Vital Signs Assessment: post-procedure vital signs reviewed and stable Respiratory status: spontaneous breathing, nonlabored ventilation, respiratory function stable and patient connected to nasal cannula oxygen Cardiovascular status: stable and blood pressure returned to baseline Postop Assessment: no apparent nausea or vomiting Anesthetic complications: no   No notable events documented.  Last Vitals:  Vitals:   02/06/22 0646 02/06/22 0759  BP: 129/61 133/62  Pulse: 78 82  Resp: 19 20  Temp: 36.6 C 36.6 C  SpO2: 100% 100%    Last Pain:  Vitals:   02/06/22 0759  TempSrc: Oral  PainSc:                  Barnet Glasgow

## 2022-02-06 NOTE — Discharge Instructions (Signed)
YOU HAD AN ENDOSCOPIC PROCEDURE TODAY: Refer to the procedure report and other information in the discharge instructions given to you for any specific questions about what was found during the examination. If this information does not answer your questions, please call [facility] office at [phone] to clarify.   YOU SHOULD EXPECT: Some feelings of bloating in the abdomen. Passage of more gas than usual. Walking can help get rid of the air that was put into your GI tract during the procedure and reduce the bloating. If you had a lower endoscopy (such as a colonoscopy or flexible sigmoidoscopy) you may notice spotting of blood in your stool or on the toilet paper. Some abdominal soreness may be present for a day or two, also.  DIET: Your first meal following the procedure should be a light meal and then it is ok to progress to your normal diet. A half-sandwich or bowl of soup is an example of a good first meal. Heavy or fried foods are harder to digest and may make you feel nauseous or bloated. Drink plenty of fluids but you should avoid alcoholic beverages for 24 hours. If you had a esophageal dilation, please see attached instructions for diet.    ACTIVITY: Your care partner should take you home directly after the procedure. You should plan to take it easy, moving slowly for the rest of the day. You can resume normal activity the day after the procedure however YOU SHOULD NOT DRIVE, use power tools, machinery or perform tasks that involve climbing or major physical exertion for 24 hours (because of the sedation medicines used during the test).   SYMPTOMS TO REPORT IMMEDIATELY: A gastroenterologist can be reached at any hour. Please call [phone]  for any of the following symptoms:   Following upper endoscopy (EGD, EUS, ERCP, esophageal dilation) Vomiting of blood or coffee ground material  New, significant abdominal pain  New, significant chest pain or pain under the shoulder blades  Painful or  persistently difficult swallowing  New shortness of breath  Black, tarry-looking or red, bloody stools  FOLLOW UP:  If any biopsies were taken you will be contacted by phone or by letter within the next 1-3 weeks. Call [phone]  if you have not heard about the biopsies in 3 weeks.  Please also call with any specific questions about appointments or follow up tests.  

## 2022-02-07 ENCOUNTER — Ambulatory Visit: Payer: Medicare Other

## 2022-02-07 ENCOUNTER — Encounter: Payer: Self-pay | Admitting: Gastroenterology

## 2022-02-07 DIAGNOSIS — R0989 Other specified symptoms and signs involving the circulatory and respiratory systems: Secondary | ICD-10-CM

## 2022-02-07 DIAGNOSIS — I6523 Occlusion and stenosis of bilateral carotid arteries: Secondary | ICD-10-CM

## 2022-02-07 DIAGNOSIS — R072 Precordial pain: Secondary | ICD-10-CM

## 2022-02-07 LAB — SURGICAL PATHOLOGY

## 2022-02-08 LAB — CMP14+EGFR
ALT: 12 IU/L (ref 0–32)
AST: 17 IU/L (ref 0–40)
Albumin/Globulin Ratio: 2.5 — ABNORMAL HIGH (ref 1.2–2.2)
Albumin: 4.5 g/dL (ref 3.8–4.8)
Alkaline Phosphatase: 64 IU/L (ref 44–121)
BUN/Creatinine Ratio: 16 (ref 12–28)
BUN: 19 mg/dL (ref 8–27)
Bilirubin Total: 0.9 mg/dL (ref 0.0–1.2)
CO2: 24 mmol/L (ref 20–29)
Calcium: 9.3 mg/dL (ref 8.7–10.3)
Chloride: 104 mmol/L (ref 96–106)
Creatinine, Ser: 1.2 mg/dL — ABNORMAL HIGH (ref 0.57–1.00)
Globulin, Total: 1.8 g/dL (ref 1.5–4.5)
Glucose: 100 mg/dL — ABNORMAL HIGH (ref 70–99)
Potassium: 4.1 mmol/L (ref 3.5–5.2)
Sodium: 142 mmol/L (ref 134–144)
Total Protein: 6.3 g/dL (ref 6.0–8.5)
eGFR: 50 mL/min/{1.73_m2} — ABNORMAL LOW (ref >59)

## 2022-02-08 LAB — LIPID PANEL WITH LDL/HDL RATIO
Cholesterol, Total: 162 mg/dL (ref 100–199)
HDL: 83 mg/dL (ref >39)
LDL Chol Calc (NIH): 68 mg/dL (ref 0–99)
LDL/HDL Ratio: 0.8 ratio (ref 0.0–3.2)
Triglycerides: 54 mg/dL (ref 0–149)
VLDL Cholesterol Cal: 11 mg/dL (ref 5–40)

## 2022-02-08 LAB — HEMOGLOBIN AND HEMATOCRIT, BLOOD
Hematocrit: 37.1 % (ref 34.0–46.6)
Hemoglobin: 13.1 g/dL (ref 11.1–15.9)

## 2022-02-08 LAB — LDL CHOLESTEROL, DIRECT: LDL Direct: 68 mg/dL (ref 0–99)

## 2022-02-10 ENCOUNTER — Encounter: Payer: Self-pay | Admitting: Physical Therapy

## 2022-02-10 ENCOUNTER — Ambulatory Visit: Payer: Medicare Other | Attending: Urology | Admitting: Physical Therapy

## 2022-02-10 DIAGNOSIS — R278 Other lack of coordination: Secondary | ICD-10-CM | POA: Insufficient documentation

## 2022-02-10 DIAGNOSIS — N3941 Urge incontinence: Secondary | ICD-10-CM | POA: Insufficient documentation

## 2022-02-10 DIAGNOSIS — M6281 Muscle weakness (generalized): Secondary | ICD-10-CM | POA: Diagnosis present

## 2022-02-10 NOTE — Therapy (Signed)
[facility] @ [place] [place] [place], [place], [address] Phone: [phone]   Fax:  [phone]  Physical Therapy Treatment  Patient Details  Name: Tina [name] MRN: [id number] Date of Birth: [birth year] Referring Provider (PT): Dr. Jacalyn Lefevre   Encounter Date: 02/10/2022   PT End of Session - 02/10/22 1233     Visit Number 8    Date for PT Re-Evaluation 02/17/22    Authorization Type Medicare and Medicaid    Authorization - Visit Number 8    Authorization - Number of Visits 10    PT Start Time 0240    PT Stop Time 1308    PT Time Calculation (min) 38 min    Activity Tolerance Patient tolerated treatment well    Behavior During Therapy [facility] for tasks assessed/performed             Past Medical History:  Diagnosis Date   Arthritis    "joints; fingers; shoulders; knees; back" (08/27/2012)   Chronic back pain    epidural injections   Chronic neck pain    myelopathy   COPD (chronic obstructive pulmonary disease) ([place])    Fibromyalgia    "dx'd long time ago; before they really knew what it was" (112/02/2012)   Gastric ulcer    GERD (gastroesophageal reflux disease)    takes Protonix daily   Heart murmur    History of bronchitis several yrs ago   History of colon polyps    Hyperlipidemia    takes Lovastatin daily   Hypertension    takes Losartan daily   Hypertensive retinopathy    OU   Joint swelling    Osteoporosis    Palpitations    takes Atenolol daily   Pneumonia 2013   hx of   Prediabetes    Scoliosis    Sleep apnea    "have a mask but I don't wear it" (08/27/2012)   Weakness    numbness in hands and feet    Past Surgical History:  Procedure Laterality Date   ANTERIOR CERVICAL DECOMP/DISCECTOMY FUSION N/A 07/16/2016   Procedure: ANTERIOR CERVICAL DECOMPRESSION FUSION CERVICAL 3-4, CERVICAL 4-5, CERVICAL 5-6 WITH INSTRUMENATION AND ALLOGRAFT;  Surgeon: Phylliss Bob, MD;  Location: [facility] [facility];  Service: Orthopedics;  Laterality: N/A;  ANTERIOR CERVICAL DECOMPRESSION FUSION CERVICAL 3-4, CERVICAL 4-5, CERVICAL 5-6 WITH INSTRUMENATION AND ALLOGRAFT   BIOPSY  02/06/2022   Procedure: BIOPSY;  Surgeon: Yetta Flock, MD;  Location: [facility];  Service: Gastroenterology;;   CATARACT EXTRACTION Right 05/18/2017   Dr. Leanora Ivanoff   CATARACT EXTRACTION Left uknown   CATARACT EXTRACTION, BILATERAL  2018   COLONOSCOPY     ESOPHAGOGASTRODUODENOSCOPY     ESOPHAGOGASTRODUODENOSCOPY (EGD) WITH PROPOFOL N/A 02/06/2022   Procedure: ESOPHAGOGASTRODUODENOSCOPY (EGD) WITH PROPOFOL;  Surgeon: Yetta Flock, MD;  [facility];  Service: Gastroenterology;  Laterality: N/A;   EYE SURGERY Bilateral    Cat Sx   fatty tumor removed from right leg     as a teenager   HIP ARTHROPLASTY  08/27/2012   Procedure: ARTHROPLASTY BIPOLAR HIP;  Surgeon: Hessie Dibble, MD;  Location: [facility];  Service: Orthopedics;  Laterality: Left;   ROTATOR CUFF REPAIR     ROTATOR CUFF REPAIR Left     There were no vitals filed for this visit.   Subjective Assessment - 02/10/22 1234     Subjective I am doing alot better.    Patient Stated Goals reduce the amount of  times she has to urinate at night.    Currently in Pain? No/denies                [facility] PT Assessment - 02/10/22 0001       Assessment   Medical Diagnosis urinary frequency, nocturia, urge incontinence, urinary urgency    Referring Provider (PT) Dr. Jacalyn Lefevre    Onset Date/Surgical Date --   2 years   Prior Therapy none      Precautions   Precautions Other (comment)    Precaution Comments osteoporosis      Restrictions   Weight Bearing Restrictions No      Prior Function   Level of Independence Independent    Leisure none      Cognition   Overall Cognitive Status Within Functional Limits for tasks assessed      Posture/Postural Control   Posture/Postural Control No significant limitations                         Pelvic Floor Special Questions - 02/10/22 0001     Urinary Leakage Yes    Pad use 1 pantyliner    Urinary frequency urinate 3 times per night    Exam Type Deferred    Strength fair squeeze, definite lift    Biofeedback resting tone supine is 4uv, contract to 16 uv for 10 sec, can quickly contract to 18 uv with straight up and down. able to contract the pelvic floor with sit to stand, walking and pelvic floor contraction, laugh in supine with 30 uv contraction, standing contract to 18 uv, 13 uv in stadning for 10 seconds, marching in standing at 10 uv. sit to stand at 40 uv, stand to sit at 18 uv    Biofeedback sensor type Surface   vaginal              [facility] Adult PT Treatment/Exercise - 02/10/22 0001       Self-Care   Self-Care Other Self-Care Comments    Other Self-Care Comments  reviewed the urge to vid technique and she is still having trouble doing it every time                       PT Short Term Goals - 02/10/22 1235       PT SHORT TERM GOAL #1   Title Patient independent with initial HEP for pelvic floor strengthening    Time 4    Period Weeks    Status Achieved    Target Date 09/30/21      PT SHORT TERM GOAL #2   Title understand how to perfrom urge to void exercises to reduce the amount of time she has to urinate at night    Time 4    Period Weeks    Status Achieved    Target Date 09/30/21               PT Long Term Goals - 02/10/22 1236       PT LONG TERM GOAL #1   Title Independent with advanced HEP for pelvic floor strength    Time 12    Period Weeks    Status Achieved      PT LONG TERM GOAL #2   Title able to delay the urge to void at night so she is urinating 2 times per night    Baseline gets up 3 times per night    Time 12  Period Weeks    Status Partially Met    Target Date 11/25/21      PT LONG TERM GOAL #3   Title able to walk to the bathroom when she has the urge to urinate and not leak  urine due to circular pelvic floor contraction at strength >/= 3/5    Baseline urinary leakage is 75% better.    Time 12    Period Weeks    Status Achieved    Target Date 11/25/21      PT LONG TERM GOAL #4   Title ability to lift items without urinary leakage due to improved pelvic floor strength >/= 3/5    Time 12    Period Weeks    Status Achieved                   Plan - 02/10/22 1303     Clinical Impression Statement Patient reports her urinary leakage is 75% better. She continues to work on urge to void at night. She will go to the bathroom 3 times instead of 5 times at night. Patient is able to contract the pelvic floor for 10 seconds. She is able to contract to 18 uv for sit to stand and decreased to 14 uv with stand to sit. Patient is able to relax her pelvic floor in sitting and standing to 4 uv. Patient is able to contract quickly in supine and sitting. She is able to contract to 13 uv while walking. Patient is ready for discharge and independent with her HEP.    Personal Factors and Comorbidities Comorbidity 2;Fitness    Comorbidities cervical fusion; osteoporosis    Examination-Activity Limitations Continence;Locomotion Level;Bed Mobility;Stand    Stability/Clinical Decision Making Stable/Uncomplicated    Rehab Potential Excellent    PT Treatment/Interventions ADLs/Self Care Home Management;Biofeedback;Therapeutic activities;Therapeutic exercise;Neuromuscular re-education;Patient/family education;Manual techniques    PT Next Visit Plan Discharge to HEP    PT Home Exercise Plan Access Code: [email or web address]    Consulted and Agree with Plan of Care Patient             Patient will benefit from skilled therapeutic intervention in order to improve the following deficits and impairments:  Decreased coordination, Decreased endurance, Decreased strength, Decreased activity tolerance  Visit Diagnosis: Other lack of coordination  Muscle weakness (generalized)  Urge  incontinence     Problem List Patient Active Problem List   Diagnosis Date Noted   Gastric intestinal metaplasia    Cervical disc disease with myelopathy 07/16/2016   Benign essential HTN 12/13/2015   Pneumococcal vaccine refused 01/09/2015   Depression screening 11/23/2013   Hypomagnesemia 04/06/2013   Osteoporosis 04/06/2013   Asthma 02/17/2013   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   B-complex deficiency 02/17/2013   Diverticulosis of colon 02/17/2013   Dysphagia 02/17/2013   Internal hemorrhoids with complication 24/26/8341   Low back pain 02/17/2013   Neck pain 02/17/2013   Obstructive sleep apnea 02/17/2013   Osteoarthrosis 02/17/2013   Pernicious anemia 02/17/2013   Femoral fracture ([place]) 08/27/2012   Tobacco abuse 08/27/2012   Diabetes ([place]) 08/27/2012   Hyperlipidemia 08/27/2012   PAF (paroxysmal atrial fibrillation) ([place]) 08/27/2012   COPD (chronic obstructive pulmonary disease) ([place]) 08/27/2012   GERD (gastroesophageal reflux disease) 08/27/2012   Vitamin deficiency 08/27/2012   Tobacco dependence syndrome 08/27/2012   Carpal tunnel syndrome 02/03/2012   Vitamin B12 deficiency anemia 11/15/2010    Earlie Counts, PT 02/10/22 1:12 PM   [facility] @  [place], [place], [address] Phone: [phone]   Fax:  [phone]  Name: Tina [name] MRN: [id number] Date of Birth: 07-14-1956  PHYSICAL THERAPY DISCHARGE SUMMARY  Visits from Start of Care: 8  Current functional level related to goals / functional outcomes: See above.    Remaining deficits: See above.    Education / Equipment: HEP   Patient agrees to discharge. Patient goals were met. Patient is being discharged due to meeting the stated rehab goals. Thank you for the referral. Earlie Counts, PT 02/10/22 1:13 PM

## 2022-02-21 NOTE — Progress Notes (Signed)
Called pt no answer leftt a vm

## 2022-02-24 ENCOUNTER — Ambulatory Visit: Payer: Medicare Other

## 2022-02-24 ENCOUNTER — Inpatient Hospital Stay: Payer: Medicare Other

## 2022-02-24 DIAGNOSIS — R002 Palpitations: Secondary | ICD-10-CM

## 2022-02-24 DIAGNOSIS — R072 Precordial pain: Secondary | ICD-10-CM

## 2022-02-25 LAB — PCV MYOCARDIAL PERFUSION WITH LEXISCAN
Base ST Depression (mm): 0 mm
ST Depression (mm): 0 mm

## 2022-02-25 NOTE — Progress Notes (Signed)
Called pt to inform her about her duplex. Pt understood

## 2022-02-25 NOTE — Progress Notes (Signed)
Called pt to inform her about her lab results. Pt understood

## 2022-02-25 NOTE — Progress Notes (Signed)
Called pt to inform her about her stress test pt understood

## 2022-03-03 ENCOUNTER — Ambulatory Visit: Payer: Medicare Other | Admitting: Cardiology

## 2022-03-14 ENCOUNTER — Encounter: Payer: Self-pay | Admitting: Cardiology

## 2022-03-14 ENCOUNTER — Ambulatory Visit: Payer: Medicare Other | Admitting: Cardiology

## 2022-03-14 VITALS — BP 129/69 | HR 87 | Temp 98.4°F | Resp 16 | Ht 65.0 in | Wt 124.8 lb

## 2022-03-14 DIAGNOSIS — F17210 Nicotine dependence, cigarettes, uncomplicated: Secondary | ICD-10-CM

## 2022-03-14 DIAGNOSIS — R002 Palpitations: Secondary | ICD-10-CM

## 2022-03-14 DIAGNOSIS — R072 Precordial pain: Secondary | ICD-10-CM

## 2022-03-14 DIAGNOSIS — I10 Essential (primary) hypertension: Secondary | ICD-10-CM

## 2022-03-14 DIAGNOSIS — R0989 Other specified symptoms and signs involving the circulatory and respiratory systems: Secondary | ICD-10-CM

## 2022-03-14 DIAGNOSIS — E782 Mixed hyperlipidemia: Secondary | ICD-10-CM

## 2022-04-04 ENCOUNTER — Other Ambulatory Visit: Payer: Self-pay | Admitting: Cardiology

## 2022-04-04 DIAGNOSIS — R002 Palpitations: Secondary | ICD-10-CM

## 2022-04-04 DIAGNOSIS — I471 Supraventricular tachycardia: Secondary | ICD-10-CM

## 2022-04-04 MED ORDER — DILTIAZEM HCL ER 120 MG PO CP24: 120.0000 mg | ORAL_CAPSULE | Freq: Every morning | ORAL | 0 refills | Status: DC

## 2022-04-15 ENCOUNTER — Other Ambulatory Visit: Payer: Self-pay | Admitting: Orthopaedic Surgery

## 2022-04-15 DIAGNOSIS — M25511 Pain in right shoulder: Secondary | ICD-10-CM

## 2022-04-26 ENCOUNTER — Other Ambulatory Visit: Payer: Self-pay | Admitting: Gastroenterology

## 2022-05-01 ENCOUNTER — Other Ambulatory Visit: Payer: Medicare Other

## 2022-05-01 ENCOUNTER — Other Ambulatory Visit (HOSPITAL_COMMUNITY): Payer: Self-pay | Admitting: Orthopaedic Surgery

## 2022-05-01 DIAGNOSIS — M25511 Pain in right shoulder: Secondary | ICD-10-CM

## 2022-06-10 ENCOUNTER — Encounter: Payer: Self-pay | Admitting: Neurology

## 2022-07-16 ENCOUNTER — Other Ambulatory Visit: Payer: Self-pay | Admitting: Physician Assistant

## 2022-07-16 DIAGNOSIS — Z1231 Encounter for screening mammogram for malignant neoplasm of breast: Secondary | ICD-10-CM

## 2022-09-09 ENCOUNTER — Ambulatory Visit
Admission: RE | Admit: 2022-09-09 | Discharge: 2022-09-09 | Disposition: A | Discharge status: Home / Self Care | Payer: Medicare (Managed Care) | Source: Ambulatory Visit | Attending: Physician Assistant | Admitting: Physician Assistant

## 2022-09-09 DIAGNOSIS — Z1231 Encounter for screening mammogram for malignant neoplasm of breast: Secondary | ICD-10-CM

## 2022-10-20 ENCOUNTER — Telehealth: Payer: Self-pay

## 2022-10-20 DIAGNOSIS — K862 Cyst of pancreas: Secondary | ICD-10-CM

## 2022-10-20 NOTE — Telephone Encounter (Signed)
-----  Message from Roetta Sessions, [place] sent at 10/25/2021 11:43 AM EST ----- Regarding: due for MRCP in Feb Patient will be due for recall MRCP - pancreatic cyst -in mid Feb 2024

## 2022-10-20 NOTE — Telephone Encounter (Signed)
-----  Message from Rosalia Mcavoy M Ankita Newcomer, CMA sent at 10/25/2021 11:43 AM EST ----- Regarding: due for MRCP in Feb Patient will be due for recall MRCP - pancreatic cyst -in mid Feb 2024  

## 2022-10-20 NOTE — Telephone Encounter (Signed)
[facility] to call patient to schedule MRCP for mid February

## 2022-10-20 NOTE — Telephone Encounter (Signed)
Called [facility].  They will call patient to schedule her for MRCP in mid February for pancreatic cyst.

## 2022-10-23 NOTE — Telephone Encounter (Signed)
The order erroneously said due in July but patient is due for MRCP in Feb.  [facility] and updated the order.  They will call the patient and schedule MRCP in February.

## 2022-10-23 NOTE — Telephone Encounter (Addendum)
Patient called is wondering if there is an error on our end because she spoke with [facility] and I think they offered her an appt after her follow up appt with Dr. Havery [name]. Would like to know if that is ok.

## 2022-11-03 ENCOUNTER — Encounter: Payer: Self-pay | Admitting: Gastroenterology

## 2022-11-03 ENCOUNTER — Ambulatory Visit (INDEPENDENT_AMBULATORY_CARE_PROVIDER_SITE_OTHER): Payer: 59 | Admitting: Gastroenterology

## 2022-11-03 VITALS — BP 104/50 | HR 84 | Ht 65.0 in | Wt 121.1 lb

## 2022-11-03 DIAGNOSIS — K219 Gastro-esophageal reflux disease without esophagitis: Secondary | ICD-10-CM | POA: Diagnosis not present

## 2022-11-03 DIAGNOSIS — K862 Cyst of pancreas: Secondary | ICD-10-CM | POA: Diagnosis not present

## 2022-11-03 DIAGNOSIS — K31A0 Gastric intestinal metaplasia, unspecified: Secondary | ICD-10-CM

## 2022-11-03 MED ORDER — OMEPRAZOLE 20 MG PO CPDR: DELAYED_RELEASE_CAPSULE | ORAL | 3 refills | Status: DC

## 2022-11-03 NOTE — Progress Notes (Signed)
HPI :  67 y/o female here for follow up for pancreatic cyst, GERD, GIM.   Recall she has had a benign appearing 1 cm pancreatic cyst noted since imaging in 2018.  She has had a few MRCPs since that time which have shown stability.  Her last MRCP was performed 11/09/20 - the cyst in the body of her pancreas measured 30m, smaller in size compared to prior exams.  She also has a history of GERD and epigastric burning she has had in the past.  Previously was on Protonix 40 mg once to twice daily remotely in the past.  Eventually transitioned to Pepcid as she was concerned about osteoporosis and fracture risk.  She has been followed by her primary care for osteoporosis.  She states she has reflux frequently that bothers her, with epigastric burning.  She was concerned about dementia risk with Pepcid so stopped it although states it did not really work too much to begin with.  She states symptoms were definitely better controlled on PPI.  She uses Rolaids as needed.  No nausea or vomiting.  Weight stable.  Her last DEXA scan was done in December and her T-score was -2.9, being managed by PCP.  She is currently on Fosamax.  We discussed how much the symptoms bothered her versus risks of the medications today and how she wanted to proceed.  She is due for MRCP, she reminds me she has claustrophobia and has had open MRI in the past.  Otherwise no family history of gastric cancer in first-degree relatives, she questions if her nephew had stomach cancer.  Her last endoscopy was done in May 2023 for gastric mapping for history of gastric intestinal metaplasia.  She had 1 out of 3 bottles that had gastric intestinal metaplasia in the antrum/incisura, negative in the body and fundus.   [place]- 05/05/17 - 1.3cm gallstone, 0.2cm polyp, normal exam otherwise CT scan abdomen - 05/27/17 - gallstones, 946mpancreatic cyst in PD, 37m46myst in tail, diffuse atherosclerosis   EGD 03/20/17 - mild esophagitis, gastritis, normal  duodenum - biopsies taken to rule out H pylori - path shows no H pylori, but findings include incidental finding of gastric intestinal metaplasia, normal esophageal biopsies Colonoscopy 03/19/17 - normal   MRCP 09/29/2019 - IMPRESSION: 1. Stable 9 mm cystic lesion in the body of the pancreas. No signs of main duct dilation or enhancement. Recommend follow up pre and post contrast MRI/MRCP or pancreatic protocol CT in 1 year, this should continue for a total of 5 years from the initial imaging study. This recommendation follows ACR consensus guidelines: Management of Incidental Pancreatic Cysts: A White Paper of the ACR Incidental Findings Committee. J Am Coll Radiol [id number]. Stable small splenic cyst. 3. Atherosclerotic changes throughout the abdominal aorta.   MRCP 11/09/20: IMPRESSION: 1. Stable small cyst within body of pancreas. This has been stable since 05/27/17. Continued interval surveillance is advised. The next follow-up exam should be obtained in 12 months with repeat pancreas protocol MRI without and with contrast material. 2. Stable appearance of small liver and splenic cysts. 3. Cholelithiasis.     EGD 02/06/22: normal stomach, atrophic stomach - biopsies taken, normal duodenum  FINAL MICROSCOPIC DIAGNOSIS:   A. STOMACH, ANTRUM INCISURA, BIOPSY:  - Antral mucosa with slight chronic inflammation and intestinal  metaplasia.  - No Helicobacter pylori identified.  - Negative for dysplasia.   B. STOMACH, BODY, BIOPSY:  - Oxyntic mucosa with hyperemia and mild reactive changes.  -  No Helicobacter pylori identified.  - Negative for intestinal metaplasia or dysplasia.   C. STOMACH, FUNDUS, BIOPSY:  - Oxyntic mucosa with slight hyperemia.  - No Helicobacter pylori identified.  - Negative for intestinal metaplasia or dysplasia.   Nuclear stress test 02/25/22: low risk study, normal EF  Zio patch - 03/18/22 - dominant sinus rhytym with some PVCs, rare SVT    Past  Medical History:  Diagnosis Date   Arthritis    "joints; fingers; shoulders; knees; back" (08/27/2012)   Chronic back pain    epidural injections   Chronic neck pain    myelopathy   COPD (chronic obstructive pulmonary disease) ([place])    Fibromyalgia    "dx'd long time ago; before they really knew what it was" (112/02/2012)   Gastric ulcer    GERD (gastroesophageal reflux disease)    takes Protonix daily   Heart murmur    History of bronchitis several yrs ago   History of colon polyps    Hyperlipidemia    takes Lovastatin daily   Hypertension    takes Losartan daily   Hypertensive retinopathy    OU   Joint swelling    Osteoporosis    Palpitations    takes Atenolol daily   Pneumonia 2013   hx of   Prediabetes    Scoliosis    Sleep apnea    "have a mask but I don't wear it" (08/27/2012)   Weakness    numbness in hands and feet     Past Surgical History:  Procedure Laterality Date   ANTERIOR CERVICAL DECOMP/DISCECTOMY FUSION N/A 07/16/2016   Procedure: ANTERIOR CERVICAL DECOMPRESSION FUSION CERVICAL 3-4, CERVICAL 4-5, CERVICAL 5-6 WITH INSTRUMENATION AND ALLOGRAFT;  Surgeon: Phylliss Bob, MD;  Location: [facility] [place] [facility];  Service: Orthopedics;  Laterality: N/A;  ANTERIOR CERVICAL DECOMPRESSION FUSION CERVICAL 3-4, CERVICAL 4-5, CERVICAL 5-6 WITH INSTRUMENATION AND ALLOGRAFT   BIOPSY  02/06/2022   Procedure: BIOPSY;  Surgeon: Yetta Flock, MD;  [facility];  Service: Gastroenterology;;   CATARACT EXTRACTION Right 05/18/2017   Dr. Leanora Ivanoff   CATARACT EXTRACTION Left uknown   CATARACT EXTRACTION, BILATERAL  2018   COLONOSCOPY     ESOPHAGOGASTRODUODENOSCOPY     ESOPHAGOGASTRODUODENOSCOPY (EGD) WITH PROPOFOL N/A 02/06/2022   Procedure: ESOPHAGOGASTRODUODENOSCOPY (EGD) WITH PROPOFOL;  Surgeon: Yetta Flock, MD;  [facility]: [facility];  Service: Gastroenterology;  Laterality: N/A;   EYE SURGERY Bilateral    Cat Sx   fatty tumor removed from right leg     as  a teenager   HIP ARTHROPLASTY  08/27/2012   Procedure: ARTHROPLASTY BIPOLAR HIP;  Surgeon: Hessie Dibble, MD;  Location: [facility];  Service: Orthopedics;  Laterality: Left;   ROTATOR CUFF REPAIR Left 02/2021   Family History  Problem Relation Age of Onset   Throat cancer Mother    Prostate cancer Father    Hypertension Father    Stroke Sister        several   Seizures Sister    Dementia Sister    Stroke Sister    Lung cancer Brother    Breast cancer Maternal Grandmother    Diabetes Other    Thyroid disease Neg Hx    Social History   Tobacco Use   Smoking status: Every Day    Packs/day: 0.50    Years: 30.00    Total pack years: 15.00    Types: Cigarettes   Smokeless tobacco: Never  Vaping Use   Vaping Use: Never used  Substance  Use Topics   Alcohol use: No   Drug use: No   Current Outpatient Medications  Medication Sig Dispense Refill   acetaminophen (TYLENOL) 500 MG tablet Take 500 mg by mouth every 6 (six) hours as needed.     alendronate (FOSAMAX) 70 MG tablet Take 70 mg by mouth once a week.     amLODipine (NORVASC) 5 MG tablet Take 5 mg by mouth daily.     carboxymethylcellul-glycerin (REFRESH RELIEVA) 0.5-0.9 % ophthalmic solution Place 1 drop into both eyes as needed for dry eyes.     Cholecalciferol (VITAMIN D3) 25 MCG (1000 UT) CAPS Take 1,000 Units by mouth daily.     gabapentin (NEURONTIN) 300 MG capsule Take 1 capsule (300 mg total) by mouth at bedtime. 90 capsule 3   lovastatin (MEVACOR) 20 MG tablet Take 20 mg by mouth at bedtime.     montelukast (SINGULAIR) 10 MG tablet Take 10 mg by mouth at bedtime.     valsartan (DIOVAN) 160 MG tablet Take 160 mg by mouth daily. for high blood pressure  1   No current facility-administered medications for this visit.   Allergies  Allergen Reactions   Tramadol Other (See Comments)    "It makes my chest feel funny; I don't like to take that"   Cortizone-10 [Hydrocortisone]     Shots- Headaches    Lisinopril Other  (See Comments)    cough   Vicodin [Hydrocodone-Acetaminophen] Palpitations and Other (See Comments)    Dizziness, Chest pain      Review of Systems: All systems reviewed and negative except where noted in HPI.   Lab Results  Component Value Date   WBC 6.8 02/26/2017   HGB 13.1 02/07/2022   HCT 37.1 02/07/2022   MCV 98.1 02/26/2017   PLT 205 02/26/2017    Lab Results  Component Value Date   CREATININE 1.20 (H) 02/07/2022   BUN 19 02/07/2022   NA 142 02/07/2022   K 4.1 02/07/2022   CL 104 02/07/2022   CO2 24 02/07/2022    Lab Results  Component Value Date   ALT 12 02/07/2022   AST 17 02/07/2022   ALKPHOS 64 02/07/2022   BILITOT 0.9 02/07/2022     Physical Exam: BP (!) 104/50 (BP Location: Left Arm, Patient Position: Sitting, Cuff Size: Normal)   Pulse 84   Ht 5' 5"$  (1.651 m)   Wt 121 lb 2 oz (54.9 kg)   BMI 20.16 kg/m  Constitutional: Pleasant,well-developed, female in no acute distress. Neurological: Alert and oriented to person place and time. Psychiatric: Normal mood and affect. Behavior is normal.   ASSESSMENT: 67 y.o. female here for assessment of the following  1. Pancreatic cyst   2. Gastroesophageal reflux disease, unspecified whether esophagitis present   3. Gastric intestinal metaplasia    Benign-appearing pancreatic cyst.  She really does not have any symptoms from this, stable over time, she is due for surveillance MRCP to make sure no interval growth.  We discussed what this is, she would prefer an open MRI due to claustrophobia.  Further recommendations pending the results.  Hopefully with continued stability over time we do not need to survey this for too much longer.  She does have a history of GERD, history of gastric intestinal metaplasia underwent gastric mapping in May.  She does not have extensive metaplasia, no strong family history of gastric cancer.  We discussed this finding, while theoretically can increase her risk for gastric cancer  I do not feel  strongly she needs further surveillance given this seems limited and without any associated high risk lesions.  She does have some ongoing reflux symptoms that bother her routinely.  Better managed with PPI in the past although had tried to minimize PPI use given her osteoporosis.  She is been managed with Pepcid over the past year and states it does not work for her, she is concerned about dementia risk with this which I counseled her I do not think there is any significant risk for it.  That being said given its inability to control symptoms, we discussed how much this bothers her and long-term risks of chronic PPI if she needs it.  For her, risk would be increased bone fracture risk in the setting of osteoporosis, which is being managed with Fosamax.  Certainly at a short course of omeprazole would be reasonable to get control of symptoms and then she can use as needed.  She understands this and will use 4-week supply of omeprazole and then use as needed.  If it is not working she will contact me and we can discuss other options.  PLAN: - schedule for open MRCP to survey pancreatic cyst - discussed options for antacids. Pepcid hasn't worked, will switch to omeprazole 6m / day for 1 month trial daily and then use PRN. Counseled on long term risks risks, she does have osteporosis, would like to minimize use over time however this is bothering her routinely as above - do not feel strongly she needs EGD surveillance for GIM -f/u in clinic yearly  SJolly Mango MD [facility]

## 2022-11-03 NOTE — Patient Instructions (Signed)
If your blood pressure at your visit was 140/90 or greater, please contact your primary care physician to follow up on this.  _______________________________________________________  If you are age 67 or older, your body mass index should be between 23-30. Your Body mass index is 20.16 kg/m. If this is out of the aforementioned range listed, please consider follow up with your Primary Care Provider.  If you are age 28 or younger, your body mass index should be between 19-25. Your Body mass index is 20.16 kg/m. If this is out of the aformentioned range listed, please consider follow up with your Primary Care Provider.   ________________________________________________________  The [place] [facility] providers would like to encourage you to use [facility] to communicate with providers for non-urgent requests or questions.  Due to long hold times on the telephone, sending your provider a message by [facility] may be a faster and more efficient way to get a response.  Please allow 48 business hours for a response.  Please remember that this is for non-urgent requests.  _______________________________________________________  We have sent the following medications to your pharmacy for you to pick up at your convenience:  Omeprazole 77m: once daily for 4 weeks then as needed.   [facility] at [address] . Your appointment is scheduled for Sunday, 11/23/22  at 9:50am. Please arrive 30 minutes prior to your appointment time for registration purposes. Please make certain not to have anything to eat or drink 4 hours prior to your test. In addition, if you have any metal in your body, have a pacemaker or defibrillator, please be sure to let your ordering physician know. This test typically takes 45 minutes to 1 hour to complete. Should you need to reschedule, please call [phone] do so.   Thank you for entrusting me with your care and for choosing  [facility] SC[facility]olina Cellar

## 2022-11-04 ENCOUNTER — Other Ambulatory Visit: Payer: Self-pay | Admitting: Orthopaedic Surgery

## 2022-11-04 DIAGNOSIS — M25511 Pain in right shoulder: Secondary | ICD-10-CM

## 2022-11-17 ENCOUNTER — Encounter: Payer: Self-pay | Admitting: Endocrinology

## 2022-11-17 ENCOUNTER — Ambulatory Visit (INDEPENDENT_AMBULATORY_CARE_PROVIDER_SITE_OTHER): Payer: 59 | Admitting: Endocrinology

## 2022-11-17 VITALS — BP 124/78 | HR 91 | Ht 65.0 in | Wt 123.0 lb

## 2022-11-17 DIAGNOSIS — E0590 Thyrotoxicosis, unspecified without thyrotoxic crisis or storm: Secondary | ICD-10-CM

## 2022-11-17 NOTE — Progress Notes (Signed)
Patient ID: Tina [name], female   DOB: [birth year], 67 y.o.   MRN: [id number]          Reason for Appointment: Follow-up of thyroid    History of Present Illness:    The patient's thyroid abnormality was first discovered in 03/2018 She apparently was having a routine exam and screening TSH was low This was repeated and was low again at 0.22 by PCP  On her initial consultation in 11/19 TSH was only borderline at 0.34 She was found to have a right-sided thyroid enlargement Subsequently she had a thyroid scan in 08/2018 which did not show any abnormal uptake  Her weight is about the same as a few months ago No shakiness, change in appetite or new palpitations Even with evaluation by cardiologist no specific cause of palpitations has been found in the last year, she is not on beta-blockers  Thyroid levels have been previously normal She was referred back when her PCP did routine labs in 07/2022  Recent results from 08/18/2022  TSH at 0.138, free T41.44, T32.8  Lab Results  Component Value Date   TSH 0.39 12/09/2021   TSH 0.47 12/06/2020   TSH 0.46 12/01/2019   FREET4 0.92 12/09/2021   FREET4 0.96 12/06/2020   FREET4 0.89 12/01/2019   Wt Readings from Last 3 Encounters:  11/17/22 123 lb (55.8 kg)  11/03/22 121 lb 2 oz (54.9 kg)  03/14/22 124 lb 12.8 oz (56.6 kg)       Allergies as of 11/17/2022       Reactions   Tramadol Other (See Comments)   "It makes my chest feel funny; I don't like to take that"   Cortizone-10 [hydrocortisone]    Shots- Headaches   Lisinopril Other (See Comments)   cough   Vicodin [hydrocodone-acetaminophen] Palpitations, Other (See Comments)   Dizziness, Chest pain         Medication List        Accurate as of November 17, 2022  2:32 PM. If you have any questions, ask your nurse or doctor.          acetaminophen 500 MG tablet Commonly known as: TYLENOL Take 500 mg by mouth every 6 (six) hours as needed.   alendronate  70 MG tablet Commonly known as: FOSAMAX Take 70 mg by mouth once a week.   amLODipine 5 MG tablet Commonly known as: NORVASC Take 5 mg by mouth daily.   gabapentin 300 MG capsule Commonly known as: NEURONTIN Take 1 capsule (300 mg total) by mouth at bedtime.   lovastatin 20 MG tablet Commonly known as: MEVACOR Take 20 mg by mouth at bedtime.   montelukast 10 MG tablet Commonly known as: SINGULAIR Take 10 mg by mouth at bedtime.   omeprazole 20 MG capsule Commonly known as: PRILOSEC Use once daily for 4 weeks then as needed.   Refresh Relieva 0.5-0.9 % ophthalmic solution Generic drug: carboxymethylcellul-glycerin Place 1 drop into both eyes as needed for dry eyes.   valsartan 160 MG tablet Commonly known as: DIOVAN Take 160 mg by mouth daily. for high blood pressure   Vitamin D3 25 MCG (1000 UT) capsule Generic drug: Cholecalciferol Take 1,000 Units by mouth daily.        Allergies:  Allergies  Allergen Reactions   Tramadol Other (See Comments)    "It makes my chest feel funny; I don't like to take that"   Cortizone-10 [Hydrocortisone]     Shots- Headaches    Lisinopril Other (See  Comments)    cough   Vicodin [Hydrocodone-Acetaminophen] Palpitations and Other (See Comments)    Dizziness, Chest pain     Past Medical History:  Diagnosis Date   Arthritis    "joints; fingers; shoulders; knees; back" (08/27/2012)   Chronic back pain    epidural injections   Chronic neck pain    myelopathy   COPD (chronic obstructive pulmonary disease) ([place])    Fibromyalgia    "dx'd long time ago; before they really knew what it was" (112/02/2012)   Gastric ulcer    GERD (gastroesophageal reflux disease)    takes Protonix daily   Heart murmur    History of bronchitis several yrs ago   History of colon polyps    Hyperlipidemia    takes Lovastatin daily   Hypertension    takes Losartan daily   Hypertensive retinopathy    OU   Joint swelling    Osteoporosis     Palpitations    takes Atenolol daily   Pneumonia 2013   hx of   Prediabetes    Scoliosis    Sleep apnea    "have a mask but I don't wear it" (08/27/2012)   Weakness    numbness in hands and feet    Past Surgical History:  Procedure Laterality Date   ANTERIOR CERVICAL DECOMP/DISCECTOMY FUSION N/A 07/16/2016   Procedure: ANTERIOR CERVICAL DECOMPRESSION FUSION CERVICAL 3-4, CERVICAL 4-5, CERVICAL 5-6 WITH INSTRUMENATION AND ALLOGRAFT;  Surgeon: Phylliss Bob, MD;  Location: [facility];  Service: Orthopedics;  Laterality: N/A;  ANTERIOR CERVICAL DECOMPRESSION FUSION CERVICAL 3-4, CERVICAL 4-5, CERVICAL 5-6 WITH INSTRUMENATION AND ALLOGRAFT   BIOPSY  02/06/2022   Procedure: BIOPSY;  Surgeon: Yetta Flock, MD;  Location: [facility];  Service: Gastroenterology;;   CATARACT EXTRACTION Right 05/18/2017   Dr. Leanora Ivanoff   CATARACT EXTRACTION Left uknown   CATARACT EXTRACTION, BILATERAL  2018   COLONOSCOPY     ESOPHAGOGASTRODUODENOSCOPY     ESOPHAGOGASTRODUODENOSCOPY (EGD) WITH PROPOFOL N/A 02/06/2022   Procedure: ESOPHAGOGASTRODUODENOSCOPY (EGD) WITH PROPOFOL;  Surgeon: Yetta Flock, MD;  Location: [facility];  Service: Gastroenterology;  Laterality: N/A;   EYE SURGERY Bilateral    Cat Sx   fatty tumor removed from right leg     as a teenager   HIP ARTHROPLASTY  08/27/2012   Procedure: ARTHROPLASTY BIPOLAR HIP;  Surgeon: Hessie Dibble, MD;  Location: [facility] [place];  Service: Orthopedics;  Laterality: Left;   ROTATOR CUFF REPAIR Left 02/2021    Family History  Problem Relation Age of Onset   Throat cancer Mother    Prostate cancer Father    Hypertension Father    Stroke Sister        several   Seizures Sister    Dementia Sister    Stroke Sister    Lung cancer Brother    Breast cancer Maternal Grandmother    Diabetes Other    Thyroid disease Neg Hx     Social History:  reports that she has been smoking cigarettes. She has a 15.00 pack-year smoking history. She has  never used smokeless tobacco. She reports that she does not drink alcohol and does not use drugs.   Review of Systems  She is treated for hypertension and hypercholesterolemia by her PCP Also on Fosamax for osteoporosis   Examination:   BP 124/78 (BP Location: Left Arm, Patient Position: Sitting, Cuff Size: Small)   Pulse 91   Ht '5\' 5"'$  (1.651 m)   Wt 123 lb (55.8 kg)  SpO2 96%   BMI 20.47 kg/m    The thyroid is enlarged only on the right side, about 1.5 times normal and smooth, slightly firm  Left lobe is not enlarged    No tremor, hands not unusually warm  Biceps reflexes show normal relaxation   Assessment/Plan:  History of autonomous thyroid function with right lobe enlargement, normal thyroid scan in the past Although she does not have any new symptoms suggestive of hyperthyroidism her thyroid labs in November indicated subclinical hyperthyroidism Her thyroid lab is relatively small  She does not have any objective signs of hyperthyroidism She may have early Graves' disease and this needs to be evaluated with thyrotropin receptor antibody  Thyroid levels will be checked today and may consider methimazole if TSH is significantly suppressed and free T4 upper normal again    Elayne Snare 11/17/2022  Addendum: TSH 0.17 with relatively good T4 and T3 levels and negative thyrotropin antibody.  No treatment needed and she will follow-up in 4 months

## 2022-11-18 ENCOUNTER — Encounter: Payer: Self-pay | Admitting: Gastroenterology

## 2022-11-18 LAB — TSH: TSH: 0.17 u[IU]/mL — ABNORMAL LOW (ref 0.35–5.50)

## 2022-11-18 LAB — T3, FREE: T3, Free: 2.9 pg/mL (ref 2.3–4.2)

## 2022-11-18 LAB — T4, FREE: Free T4: 1.04 ng/dL (ref 0.60–1.60)

## 2022-11-18 LAB — THYROTROPIN RECEPTOR AUTOABS: Thyrotropin Receptor Ab: <1.1 IU/L (ref 0.00–1.75)

## 2022-11-23 ENCOUNTER — Other Ambulatory Visit: Payer: 59

## 2022-11-24 ENCOUNTER — Encounter: Payer: Self-pay | Admitting: Gastroenterology

## 2022-11-28 ENCOUNTER — Ambulatory Visit
Admission: RE | Admit: 2022-11-28 | Discharge: 2022-11-28 | Disposition: A | Discharge status: Home / Self Care | Payer: 59 | Source: Ambulatory Visit | Attending: Orthopaedic Surgery | Admitting: Orthopaedic Surgery

## 2022-11-28 ENCOUNTER — Ambulatory Visit
Admission: RE | Admit: 2022-11-28 | Discharge: 2022-11-28 | Disposition: A | Discharge status: Home / Self Care | Payer: 59 | Source: Ambulatory Visit | Attending: Gastroenterology | Admitting: Gastroenterology

## 2022-11-28 DIAGNOSIS — K862 Cyst of pancreas: Secondary | ICD-10-CM

## 2022-11-28 DIAGNOSIS — M25511 Pain in right shoulder: Secondary | ICD-10-CM

## 2022-11-28 MED ORDER — GADOPICLENOL 0.5 MMOL/ML IV SOLN
6.0000 mL | Freq: Once | INTRAVENOUS | Status: AC | PRN
  Administered 2022-11-28: 6 mL via INTRAVENOUS

## 2022-12-01 ENCOUNTER — Telehealth: Payer: Self-pay | Admitting: Gastroenterology

## 2022-12-01 NOTE — Telephone Encounter (Signed)
PT is calling to discuss the [name] that was done on her. Please advise.

## 2022-12-02 ENCOUNTER — Other Ambulatory Visit: Payer: Self-pay

## 2022-12-02 NOTE — Telephone Encounter (Signed)
Please see result note from MRCP

## 2022-12-08 ENCOUNTER — Ambulatory Visit: Payer: Medicare Other | Admitting: Endocrinology

## 2022-12-23 ENCOUNTER — Encounter: Payer: Self-pay | Admitting: Neurology

## 2022-12-23 ENCOUNTER — Ambulatory Visit (INDEPENDENT_AMBULATORY_CARE_PROVIDER_SITE_OTHER): Payer: 59 | Admitting: Neurology

## 2022-12-23 VITALS — BP 135/71 | HR 94 | Ht 65.0 in | Wt 123.0 lb

## 2022-12-23 DIAGNOSIS — R202 Paresthesia of skin: Secondary | ICD-10-CM

## 2022-12-23 DIAGNOSIS — M5000 Cervical disc disorder with myelopathy, unspecified cervical region: Secondary | ICD-10-CM

## 2022-12-23 DIAGNOSIS — R200 Anesthesia of skin: Secondary | ICD-10-CM

## 2022-12-23 MED ORDER — GABAPENTIN 300 MG PO CAPS: 300.0000 mg | ORAL_CAPSULE | Freq: Two times a day (BID) | ORAL | 3 refills | Status: DC

## 2022-12-23 NOTE — Progress Notes (Signed)
Follow-up Visit   Date: 12/23/22   Tina [name] MRN: [id number] DOB: 1956-04-01   Interim History: Tina [name] is a 67 y.o. 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  The patient was accompanied to the clinic by self.  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  She reports mild worsening paresthesias and concerned that she has neuropathy.  My overall suspicion for neuropathy is low and I explained her paresthesias are residual from cervical myelomalacia.  - Increase gabapentin to 300mg  twice daily  - NCS/EMG of the left arm and leg, per patient request  Return to clinic 1 year  ----------------------------------------------- History of present illness: Starting in 2017, she began having numbness over the fingers, hands, abdomen, and legs, worse on the left side.  She also has some weakness in the hands and imbalance.  She walks unassisted and has not had any falls.  She had MRI cervical spine in 2017 which showed severe cervical canal stenosis with multilevel impingement worse at C3-5 with associated myelomalacia and underwent ACDF at C3-4, C4-5, C5-6 by Dr. Lynann Bologna.  Following surgery, the intensity of her numbness and tingling significantly improved, but did not completely resolve.  She continues to have numbness over the hands and legs, which is worse on the left side.  She occasionally has tingling and pain in the hands, worse during the winter months.  She has tried gabapentin which did not provide relief.   She is on disability since 2010 for her back.  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    UPDATE 11/16/2020:  She is here for 6 month follow-up.  She continues to have numbness in the hands, which is unchanged.  She has spells where her  hands will feel frozen and cold.  She denies weakness and falls.  Her gastroenterologist started her on gabapentin 300mg  at bedtime and she has not noticed any change.   UPDATE 12/23/2021:  She is here for follow-up visit.   She continues to have mild onoing burning in the feet which is triggered by walking.  She denies pain or tingling in the hands.  She has constant numbness.  She takes gabapentin 300mg  at bedtime and has not skipped any doses, which provides relief.  She did not start daytime dose due to concern that it may make her sleepy.   She complains of palpitations and is requesting referral to cardiology.  Palpitations can be present at rest and with exertion.  She has family history of heart disease and wants to be referred to cardiology.   UPDATE 12/23/2022:  She is here for follow-up visit.  She continues to have burning in the soles of the feet and occasional cramps in the hands.  She continues to have numbness in the hands, which is worse on the left.  She is concerned she has neuropathy and would like to be evaluated for this.  She is frustrated by the tingling and numbness which has not improved since 2017.  Medications:  Current Outpatient Medications on File Prior to Visit  Medication Sig Dispense Refill   acetaminophen (TYLENOL) 500 MG tablet Take 500 mg by mouth every 6 (six) hours as needed.     alendronate (FOSAMAX) 70 MG tablet Take 70 mg by mouth once a week.     amLODipine (NORVASC) 5 MG  tablet Take 5 mg by mouth daily.     carboxymethylcellul-glycerin (REFRESH RELIEVA) 0.5-0.9 % ophthalmic solution Place 1 drop into both eyes as needed for dry eyes.     Cholecalciferol (VITAMIN D3) 25 MCG (1000 UT) CAPS Take 1,000 Units by mouth daily.     gabapentin (NEURONTIN) 300 MG capsule Take 1 capsule (300 mg total) by mouth at bedtime. 90 capsule 3   lovastatin (MEVACOR) 20 MG tablet Take 20 mg by mouth at bedtime.     montelukast (SINGULAIR) 10 MG tablet Take 10 mg by mouth at  bedtime.     omeprazole (PRILOSEC) 20 MG capsule Take 1 capsule (20 mg total) by mouth 2 (two) times daily. Take once to twice a day as needed to manage your symptoms 60 capsule 2   valsartan (DIOVAN) 160 MG tablet Take 160 mg by mouth daily. for high blood pressure  1   No current facility-administered medications on file prior to visit.    Allergies:  Allergies  Allergen Reactions   Tramadol Other (See Comments)    "It makes my chest feel funny; I don't like to take that"   Cortizone-10 [Hydrocortisone]     Shots- Headaches    Lisinopril Other (See Comments)    cough   Vicodin [Hydrocodone-Acetaminophen] Palpitations and Other (See Comments)    Dizziness, Chest pain     Vital Signs:  BP 135/71   Pulse 94   Ht 5\' 5"  (1.651 m)   Wt 123 lb (55.8 kg)   SpO2 100%   BMI 20.47 kg/m   Neurological Exam: MENTAL STATUS including orientation to time, place, person, recent and remote memory, attention span and concentration, language, and fund of knowledge is normal.  Speech is not dysarthric.  CRANIAL NERVES:   Pupils equal round and reactive to light.  Normal conjugate, extra-ocular eye movements in all directions of gaze.  No ptosis.   MOTOR:  Motor strength is 5/5 in all extremities, except 5-/5 bilateral hands.  No atrophy, fasciculations or abnormal movements.  No pronator drift.  Tone is normal.    MSRs:  Reflexes are 3+/4 throughout except 2+/4 at the ankles.  SENSORY:  Intact to vibration throughout, except trace at the left ankle.  COORDINATION/GAIT:  Normal finger-to- nose-finger.  Gait narrow based and stable.   Data: MRI cervical spine wwo contrast 05/05/2020: 1. Prior ACDF at C3 through C6 without residual spinal stenosis. 2. Small central to right paracentral disc protrusion at C2-3 without significant spinal stenosis or cord deformity. 3. Mild disc bulge at C6-7 without stenosis or impingement. 4. Small focus of chronic myelomalacia at the level of C4-5.     Thank you for allowing me to participate in patient's care.  If I can answer any additional questions, I would be pleased to do so.    Sincerely,    Reshanda Lewey K. Posey Pronto, DO

## 2022-12-23 NOTE — Patient Instructions (Addendum)
Nerve testing of the left arm and leg  Return to clinic in 1 year  Protivin (EMG/NCS) INSTRUCTIONS  How to Prepare The neurologist conducting the EMG will need to know if you have certain medical conditions. Tell the neurologist and other EMG lab personnel if you: Have a pacemaker or any other electrical medical device Take blood-thinning medications Have hemophilia, a blood-clotting disorder that causes prolonged bleeding Bathing Take a shower or bath shortly before your exam in order to remove oils from your skin. Don't apply lotions or creams before the exam.  What to Expect You'll likely be asked to change into a hospital gown for the procedure and lie down on an examination table. The following explanations can help you understand what will happen during the exam.  Electrodes. The neurologist or a technician places surface electrodes at various locations on your skin depending on where you're experiencing symptoms. Or the neurologist may insert needle electrodes at different sites depending on your symptoms.  Sensations. The electrodes will at times transmit a tiny electrical current that you may feel as a twinge or spasm. The needle electrode may cause discomfort or pain that usually ends shortly after the needle is removed. If you are concerned about discomfort or pain, you may want to talk to the neurologist about taking a short break during the exam.  Instructions. During the needle EMG, the neurologist will assess whether there is any spontaneous electrical activity when the muscle is at rest - activity that isn't present in healthy muscle tissue - and the degree of activity when you slightly contract the muscle.  He or she will give you instructions on resting and contracting a muscle at appropriate times. Depending on what muscles and nerves the neurologist is examining, he or she may ask you to change positions during the exam.  After your EMG You  may experience some temporary, minor bruising where the needle electrode was inserted into your muscle. This bruising should fade within several days. If it persists, contact your primary care doctor.

## 2022-12-26 ENCOUNTER — Ambulatory Visit: Payer: Medicare Other | Admitting: Neurology

## 2023-02-03 ENCOUNTER — Other Ambulatory Visit: Payer: Self-pay | Admitting: Orthopaedic Surgery

## 2023-02-03 DIAGNOSIS — M25512 Pain in left shoulder: Secondary | ICD-10-CM

## 2023-02-12 ENCOUNTER — Ambulatory Visit (INDEPENDENT_AMBULATORY_CARE_PROVIDER_SITE_OTHER): Payer: 59 | Admitting: Neurology

## 2023-02-12 DIAGNOSIS — R200 Anesthesia of skin: Secondary | ICD-10-CM | POA: Diagnosis not present

## 2023-02-12 NOTE — Procedures (Signed)
[facility]  [address] [place], [address]  [place], [place] [address] Tel: [phone] Fax: [phone] Test Date:  02/12/2023  Patient: Tina [name] DOB: 1956-01-16 Physician: Nita Sickle, DO  Sex: Female Height: 5\' 5"  Ref Phys: Nita Sickle, DO  ID#: [id number]   Technician:    History: This is a 67 year old female with history of cervical myelopathy s/p ACDF at C3-6 referred for evaluation of generalized paresthesias of the hands and feet.  NCV & EMG Findings: Extensive electrodiagnostic testing of the left upper and lower extremity shows: All sensory responses including the left median, ulnar, sural, and superficial peroneal nerves are within normal limits. All motor responses including the left median, ulnar, peroneal, and tibial nerves are within normal limits. Left tibial H reflex study is within normal limits. There is no evidence of active or chronic motor axonal loss changes affecting any of the tested muscles.  Motor unit configuration and recruitment pattern is within normal limits.  Impression: This is a normal study of the left upper and lower extremities.  In particular, there is no evidence of a large fiber sensorimotor polyneuropathy or cervical/lumbosacral radiculopathy.   ___________________________ Nita Sickle, DO    Nerve Conduction Studies   Stim Site NR Peak (ms) Norm Peak (ms) O-P Amp (V) Norm O-P Amp  Left Median Anti Sensory (2nd Digit)  32 C  Wrist    3.2 <3.8 23.6 >10  Left Sup Peroneal Anti Sensory (Ant Lat Mall)  32 C  12 cm    1.8 <4.6 11.0 >3  Left Sural Anti Sensory (Lat Mall)  32 C  Calf    2.5 <4.6 10.4 >3  Left Ulnar Anti Sensory (5th Digit)  32 C  Wrist    2.9 <3.2 25.4 >5     Stim Site NR Onset (ms) Norm Onset (ms) O-P Amp (mV) Norm O-P Amp Site1 Site2 Delta-0 (ms) Dist (cm) Vel (m/s) Norm Vel (m/s)  Left Median Motor (Abd Poll Brev)  32 C  Wrist    3.4 <4.0 11.3 >5 Elbow Wrist 4.8 29.0 60 >50  Elbow    8.2   10.8         Left Peroneal Motor (Ext Dig Brev)  32 C  Ankle    3.0 <6.0 6.6 >2.5 B Fib Ankle 6.5 33.0 51 >40  B Fib    9.5  6.5  Poplt B Fib 1.5 8.0 53 >40  Poplt    11.0  6.3         Left Tibial Motor (Abd Hall Brev)  32 C  Ankle    4.1 <6.0 9.5 >4 Knee Ankle 6.9 42.0 61 >40  Knee    11.0  7.5         Left Ulnar Motor (Abd Dig Minimi)  32 C  Wrist    2.4 <3.1 10.7 >7 B Elbow Wrist 3.5 21.0 60 >50  B Elbow    5.9  10.6  A Elbow B Elbow 1.7 10.0 59 >50  A Elbow    7.6  10.1          Electromyography   Side Muscle Ins.Act Fibs Fasc Recrt Amp Dur Poly Activation Comment  Left 1stDorInt Nml Nml Nml Nml Nml Nml Nml Nml N/A  Left Abd Poll Brev Nml Nml Nml Nml Nml Nml Nml Nml N/A  Left PronatorTeres Nml Nml Nml Nml Nml Nml Nml Nml N/A  Left Biceps Nml Nml Nml Nml Nml Nml Nml Nml N/A  Left Triceps  Nml Nml Nml Nml Nml Nml Nml Nml N/A  Left Deltoid Nml Nml Nml Nml Nml Nml Nml Nml N/A  Left AntTibialis Nml Nml Nml Nml Nml Nml Nml Nml N/A  Left Gastroc Nml Nml Nml Nml Nml Nml Nml Nml N/A  Left Flex Dig Long Nml Nml Nml Nml Nml Nml Nml Nml N/A  Right BicepsFemS Nml Nml Nml Nml Nml Nml Nml Nml N/A  Left RectFemoris Nml Nml Nml Nml Nml Nml Nml Nml N/A  Left GluteusMed Nml Nml Nml Nml Nml Nml Nml Nml N/A      Waveforms:

## 2023-03-13 ENCOUNTER — Ambulatory Visit: Payer: 59 | Admitting: Cardiology

## 2023-03-13 ENCOUNTER — Encounter: Payer: Self-pay | Admitting: Cardiology

## 2023-03-13 VITALS — BP 136/72 | HR 91 | Resp 16 | Ht 65.0 in | Wt 120.2 lb

## 2023-03-13 DIAGNOSIS — R002 Palpitations: Secondary | ICD-10-CM

## 2023-03-13 DIAGNOSIS — F17210 Nicotine dependence, cigarettes, uncomplicated: Secondary | ICD-10-CM

## 2023-03-13 DIAGNOSIS — I471 Supraventricular tachycardia, unspecified: Secondary | ICD-10-CM

## 2023-03-13 DIAGNOSIS — I2510 Atherosclerotic heart disease of native coronary artery without angina pectoris: Secondary | ICD-10-CM

## 2023-03-13 DIAGNOSIS — I10 Essential (primary) hypertension: Secondary | ICD-10-CM

## 2023-03-13 DIAGNOSIS — E782 Mixed hyperlipidemia: Secondary | ICD-10-CM

## 2023-03-13 MED ORDER — DILTIAZEM HCL ER COATED BEADS 120 MG PO CP24: 120.0000 mg | ORAL_CAPSULE | Freq: Every day | ORAL | 0 refills | Status: DC

## 2023-03-13 NOTE — Progress Notes (Signed)
ID:  Tina [name], DOB October 06, 1955, MRN [id number]  PCP:  Karene Fry Gerome Apley, PA-C  Cardiologist:  Tessa Lerner, DO, [facility] (established care 01/27/2022)  Date: 03/13/23 Last Office Visit: 03/14/2022  Chief Complaint  Patient presents with   Follow-up    1 year follow-up for palpitations    HPI  Tina [name] is a 67 y.o. African-American female whose past medical history and cardiovascular risk factors include: Coronary artery calcification (nongated CT study), hypertension, GERD, cervical myelopathy status post ACDF C3-C6, cigarette smoker (0.5ppd).  Patient was referred to the practice for evaluation of palpitations in the past she did undergo Zio patch which illustrated underlying rhythm to be sinus with tachycardia burden of approximately 14%.  PVC burden of approximately 3%.  She was recommended to discontinue amlodipine and start diltiazem 120 mg p.o. daily.  However, recently she never transitioned to diltiazem.  Prior imaging studies have illustrated coronary artery calcification on nongated CT study.  Patient voices that she would like to get a dedicated calcium score to evaluate the severity of her CAC.  Unfortunately, she continues to smoke 1 pack/day.  ALLERGIES: Allergies  Allergen Reactions   Tramadol Other (See Comments)    "It makes my chest feel funny; I don't like to take that"   Cortizone-10 [Hydrocortisone]     Shots- Headaches    Lisinopril Other (See Comments)    cough   Vicodin [Hydrocodone-Acetaminophen] Palpitations and Other (See Comments)    Dizziness, Chest pain     MEDICATION LIST PRIOR TO VISIT: Current Meds  Medication Sig   acetaminophen (TYLENOL) 500 MG tablet Take 500 mg by mouth every 6 (six) hours as needed.   alendronate (FOSAMAX) 70 MG tablet Take 70 mg by mouth once a week.   carboxymethylcellul-glycerin (REFRESH RELIEVA) 0.5-0.9 % ophthalmic solution Place 1 drop into both eyes as needed for dry eyes.   Cholecalciferol  (VITAMIN D3) 25 MCG (1000 UT) CAPS Take 1,000 Units by mouth daily.   diltiazem (CARDIZEM CD) 120 MG 24 hr capsule Take 1 capsule (120 mg total) by mouth daily.   gabapentin (NEURONTIN) 300 MG capsule Take 1 capsule (300 mg total) by mouth 2 (two) times daily.   lovastatin (MEVACOR) 20 MG tablet Take 20 mg by mouth at bedtime.   montelukast (SINGULAIR) 10 MG tablet Take 10 mg by mouth at bedtime.   omeprazole (PRILOSEC) 20 MG capsule Take 1 capsule (20 mg total) by mouth 2 (two) times daily. Take once to twice a day as needed to manage your symptoms   valsartan (DIOVAN) 160 MG tablet Take 160 mg by mouth daily. for high blood pressure   [DISCONTINUED] amLODipine (NORVASC) 5 MG tablet Take 5 mg by mouth daily.     PAST MEDICAL HISTORY: Past Medical History:  Diagnosis Date   Arthritis    "joints; fingers; shoulders; knees; back" (08/27/2012)   Chronic back pain    epidural injections   Chronic neck pain    myelopathy   COPD (chronic obstructive pulmonary disease) (HCC)    Fibromyalgia    "dx'd long time ago; before they really knew what it was" (112/02/2012)   Gastric ulcer    GERD (gastroesophageal reflux disease)    takes Protonix daily   Heart murmur    History of bronchitis several yrs ago   History of colon polyps    Hyperlipidemia    takes Lovastatin daily   Hypertension    takes Losartan daily   Hypertensive retinopathy  OU   Joint swelling    Osteoporosis    Palpitations    takes Atenolol daily   Pneumonia 2013   hx of   Prediabetes    Scoliosis    Sleep apnea    "have a mask but I don't wear it" (08/27/2012)   Weakness    numbness in hands and feet    PAST SURGICAL HISTORY: Past Surgical History:  Procedure Laterality Date   ANTERIOR CERVICAL DECOMP/DISCECTOMY FUSION N/A 07/16/2016   Procedure: ANTERIOR CERVICAL DECOMPRESSION FUSION CERVICAL 3-4, CERVICAL 4-5, CERVICAL 5-6 WITH INSTRUMENATION AND ALLOGRAFT;  Surgeon: Estill Bamberg, MD;  Location: [facility] OR;   Service: Orthopedics;  Laterality: N/A;  ANTERIOR CERVICAL DECOMPRESSION FUSION CERVICAL 3-4, CERVICAL 4-5, CERVICAL 5-6 WITH INSTRUMENATION AND ALLOGRAFT   BIOPSY  02/06/2022   Procedure: BIOPSY;  Surgeon: Benancio Deeds, MD;  Location: [facility];  Service: Gastroenterology;;   CATARACT EXTRACTION Right 05/18/2017   Dr. Marcell Anger   CATARACT EXTRACTION Left uknown   CATARACT EXTRACTION, BILATERAL  2018   COLONOSCOPY     ESOPHAGOGASTRODUODENOSCOPY     ESOPHAGOGASTRODUODENOSCOPY (EGD) WITH PROPOFOL N/A 02/06/2022   Procedure: ESOPHAGOGASTRODUODENOSCOPY (EGD) WITH PROPOFOL;  Surgeon: Benancio Deeds, MD;  Location: [facility];  Service: Gastroenterology;  Laterality: N/A;   EYE SURGERY Bilateral    Cat Sx   fatty tumor removed from right leg     as a teenager   HIP ARTHROPLASTY  08/27/2012   Procedure: ARTHROPLASTY BIPOLAR HIP;  Surgeon: Velna Ochs, MD;  Location: [facility] OR;  Service: Orthopedics;  Laterality: Left;   ROTATOR CUFF REPAIR Left 02/2021    FAMILY HISTORY: The patient family history includes Breast cancer in her maternal grandmother; Dementia in her sister; Diabetes in an other family member; Hypertension in her father; Lung cancer in her brother; Prostate cancer in her father; Seizures in her sister; Stroke in her sister and sister; Throat cancer in her mother.  SOCIAL HISTORY:  The patient  reports that she has been smoking cigarettes. She has a 15.00 pack-year smoking history. She has never used smokeless tobacco. She reports that she does not drink alcohol and does not use drugs.  REVIEW OF SYSTEMS: Review of Systems  Cardiovascular:  Positive for palpitations. Negative for chest pain, dyspnea on exertion, leg swelling, near-syncope, orthopnea, paroxysmal nocturnal dyspnea and syncope.  Respiratory:  Positive for shortness of breath (chronic).     PHYSICAL EXAM:    03/13/2023    1:36 PM 12/23/2022    1:21 PM 11/17/2022    2:21 PM  Vitals with BMI   Height 5\' 5"  5\' 5"  5\' 5"   Weight 120 lbs 3 oz 123 lbs 123 lbs  BMI 20 20.47 20.47  Systolic 136 135 562  Diastolic 72 71 78  Pulse 91 94 91   Physical Exam  Constitutional: No distress.  Age appropriate, hemodynamically stable.   Neck: No JVD present.  Cardiovascular: Normal rate, regular rhythm, S1 normal, S2 normal, intact distal pulses and normal pulses. Exam reveals no gallop, no S3 and no S4.  No murmur heard. Pulmonary/Chest: Effort normal and breath sounds normal. No stridor. She has no wheezes. She has no rales.  Abdominal: Soft. Bowel sounds are normal. She exhibits no distension. There is no abdominal tenderness.  Musculoskeletal:        General: No edema.     Cervical back: Neck supple.  Neurological: She is alert and oriented to person, place, and time. She has intact cranial nerves (2-12).  Skin: Skin is warm and moist.   RADIOLOGY: CT of the chest without contrast lung cancer screening (Care Everywhere 08/14/2021): Per report coronary artery calcification, no pericardial effusion, heart normal size, normal caliber thoracic aorta  CARDIAC DATABASE: EKG: 03/13/2023: Sinus rhythm, 79 bpm,Old anteroseptal infarct, without underlying ischemia injury pattern.  Echocardiogram: Echocardiogram 02/07/2022:  Normal LV systolic function with EF 61%. Left ventricle cavity is normal in size. Normal left ventricular wall thickness. Normal global wall motion. Normal diastolic filling pattern. Calculated EF 61%.  Structurally normal tricuspid valve.  Moderate tricuspid regurgitation.  Mild pulmonary hypertension. RVSP measures 34 mmHg.  Pericardium is normal. Small posterior pericardial effusion with clear fluids.   Stress Testing: 06/30/2016 Care Everywhere: - Normal myocardial perfusion study  - No evidence for significant ischemia or scar is noted.  - Post stress: Global systolic function is normal. The ejection fraction was greater than 65%.  - Minimal coronary  calcifications are noted   Lexiscan Nuclear stress test 02/24/2022: Nondiagnostic ECG stress. The heart rate response was consistent with Regadenoson.  Myocardial perfusion is normal. Overall LV systolic function is normal without regional wall motion abnormalities. Stress LV EF: 72%.  No previous exam available for comparison. Low risk.   Heart Catheterization: None  Carotid artery duplex 02/07/2022: Duplex suggests stenosis in the right internal carotid artery (1-15%). Duplex suggests stenosis in the left internal carotid artery (1-15%). Left bulb with <50% stenosis. There is mild homogenous plaque noted in the bilateral carotid arteries. Antegrade right vertebral artery flow. Antegrade left vertebral artery flow. Follow up in one year is appropriate if clinically indicated.  Cardiac monitor (Zio Patch): February 24, 2022-March 10, 2022 Dominant rhythm sinus, followed by tachycardia (burden 14%). Heart rate 54-214 bpm. Avg HR 86 bpm. No atrial fibrillation, ventricular tachycardia, high grade AV block, pauses (3 seconds or longer). Total ventricular ectopic burden 1.3%.  Rare episodes of paroxysmal supraventricular tachycardia (fastest episode 6 beats, 2.6 seconds in duration, avg HR 178 bpm and max HR 214 bpm). Total supraventricular ectopic burden <1%. Patient triggered events: 7. Underlying rhythm sinus without ectopy/dysrhythmia.   LABORATORY DATA:    Latest Ref Rng & Units 02/07/2022    9:47 AM 02/26/2017    5:47 PM 07/16/2016    7:23 AM  CBC  WBC 4.0 - 10.5 K/uL  6.8  6.2   Hemoglobin 11.1 - 15.9 g/dL 84.6  96.2  95.2   Hematocrit 34.0 - 46.6 % 37.1  41.6  40.1   Platelets 150 - 400 K/uL  205  203        Latest Ref Rng & Units 02/07/2022    9:47 AM 08/18/2018    2:02 PM 07/30/2018    2:57 PM  CMP  Glucose 70 - 99 mg/dL 841  99  80   BUN 8 - 27 mg/dL 19  15  18    Creatinine 0.57 - 1.00 mg/dL 3.24  4.01  0.27   Sodium 134 - 144 mmol/L 142  137  140   Potassium 3.5 - 5.2  mmol/L 4.1  3.8  4.3   Chloride 96 - 106 mmol/L 104  104  107   CO2 20 - 29 mmol/L 24  28  28    Calcium 8.7 - 10.3 mg/dL 9.3  9.5  9.5   Total Protein 6.0 - 8.5 g/dL 6.3     Total Bilirubin 0.0 - 1.2 mg/dL 0.9     Alkaline Phos 44 - 121 IU/L 64     AST 0 -  40 IU/L 17     ALT 0 - 32 IU/L 12       Lipid Panel  Lab Results  Component Value Date   CHOL 162 02/07/2022   HDL 83 02/07/2022   LDLCALC 68 02/07/2022   LDLDIRECT 68 02/07/2022   TRIG 54 02/07/2022   CHOLHDL 2.6 08/28/2012    No components found for: "NTPROBNP" No results for input(s): "PROBNP" in the last 8760 hours. Recent Labs    11/17/22 1454  TSH 0.17*    BMP No results for input(s): "NA", "K", "CL", "CO2", "GLUCOSE", "BUN", "CREATININE", "CALCIUM", "GFRNONAA", "GFRAA" in the last 8760 hours.   HEMOGLOBIN A1C Lab Results  Component Value Date   HGBA1C 5.9 (H) 06/19/2016   MPG 123 06/19/2016    IMPRESSION:    ICD-10-CM   1. Palpitations  R00.2 EKG 12-Lead    diltiazem (CARDIZEM CD) 120 MG 24 hr capsule    Hemoglobin and hematocrit, blood    TSH    2. PSVT (paroxysmal supraventricular tachycardia)  I47.10 diltiazem (CARDIZEM CD) 120 MG 24 hr capsule    Hemoglobin and hematocrit, blood    TSH    3. Coronary artery calcification seen on computed tomography  I25.10 Lipid Panel With LDL/HDL Ratio    LDL cholesterol, direct    CMP14+EGFR    CT CARDIAC SCORING (SELF PAY ONLY)    4. Mixed hyperlipidemia  E78.2     5. Benign essential HTN  I10     6. Cigarette smoker  F17.210        RECOMMENDATIONS: Tina [name] is a 67 y.o. African-American female whose past medical history and cardiac risk factors include: Coronary artery calcification (nongated CT study), hypertension, GERD, cervical myelopathy status post ACDF C3-C6, cigarette smoker (0.5ppd).  Palpitations Present. She has not transitioned from amlodipine to diltiazem as originally planned. Discontinue amlodipine. Start diltiazem  120 mg p.o. daily. Most recent Zio patch results reviewed. Monitor for now.  Coronary artery calcification: Initially noted on nongated CT study. Has undergone appropriate ischemic workup in the past. Already on statin therapy Recommend aspirin 81 mg p.o. daily. However, patient would like to know/quantify the degree of CAC.  Coronary calcium score ordered.  Benign essential HTN Office blood pressures are very well controlled. Continue current medical therapy  Mixed hyperlipidemia Currently on lovastatin.   She denies myalgia or other side effects. Check Fasting lipid profile.    Cigarette smoker Continues to smoke 1 pack per day. Tobacco cessation counseling: Patient was informed of the dangers of tobacco abuse including stroke, cancer, and MI, as well as benefits of tobacco cessation. Patient is not willing to quit at this time. 5 mins were spent counseling patient cessation techniques. We discussed various methods to help quit smoking, including deciding on a date to quit, joining a support group, pharmacological agents- nicotine gum/patch/lozenges.  I will reassess her progress at the next follow-up visit  FINAL MEDICATION LIST END OF ENCOUNTER: Meds ordered this encounter  Medications   diltiazem (CARDIZEM CD) 120 MG 24 hr capsule    Sig: Take 1 capsule (120 mg total) by mouth daily.    Dispense:  30 capsule    Refill:  0    Medications Discontinued During This Encounter  Medication Reason   amLODipine (NORVASC) 5 MG tablet      Current Outpatient Medications:    acetaminophen (TYLENOL) 500 MG tablet, Take 500 mg by mouth every 6 (six) hours as needed., Disp: , Rfl:  alendronate (FOSAMAX) 70 MG tablet, Take 70 mg by mouth once a week., Disp: , Rfl:    carboxymethylcellul-glycerin (REFRESH RELIEVA) 0.5-0.9 % ophthalmic solution, Place 1 drop into both eyes as needed for dry eyes., Disp: , Rfl:    Cholecalciferol (VITAMIN D3) 25 MCG (1000 UT) CAPS, Take 1,000 Units  by mouth daily., Disp: , Rfl:    diltiazem (CARDIZEM CD) 120 MG 24 hr capsule, Take 1 capsule (120 mg total) by mouth daily., Disp: 30 capsule, Rfl: 0   gabapentin (NEURONTIN) 300 MG capsule, Take 1 capsule (300 mg total) by mouth 2 (two) times daily., Disp: 180 capsule, Rfl: 3   lovastatin (MEVACOR) 20 MG tablet, Take 20 mg by mouth at bedtime., Disp: , Rfl:    montelukast (SINGULAIR) 10 MG tablet, Take 10 mg by mouth at bedtime., Disp: , Rfl:    omeprazole (PRILOSEC) 20 MG capsule, Take 1 capsule (20 mg total) by mouth 2 (two) times daily. Take once to twice a day as needed to manage your symptoms, Disp: 60 capsule, Rfl: 2   valsartan (DIOVAN) 160 MG tablet, Take 160 mg by mouth daily. for high blood pressure, Disp: , Rfl: 1  Orders Placed This Encounter  Procedures   CT CARDIAC SCORING (SELF PAY ONLY)   Lipid Panel With LDL/HDL Ratio   LDL cholesterol, direct   QMV78+IONG   Hemoglobin and hematocrit, blood   TSH   EKG 12-Lead    There are no Patient Instructions on file for this visit.   --Continue cardiac medications as reconciled in final medication list. --Return in about 6 weeks (around 04/24/2023) for Follow up, Coronary artery calcification, Review test results. Or sooner if needed. --Continue follow-up with your primary care physician regarding the management of your other chronic comorbid conditions.  Patient's questions and concerns were addressed to her satisfaction. She voices understanding of the instructions provided during this encounter.   This note was created using a voice recognition software as a result there may be grammatical errors inadvertently enclosed that do not reflect the nature of this encounter. Every attempt is made to correct such errors.  Tessa Lerner, [place], [facility]  Pager:  [phone] Office: [phone]

## 2023-03-23 ENCOUNTER — Encounter: Payer: Self-pay | Admitting: Endocrinology

## 2023-03-23 ENCOUNTER — Ambulatory Visit (INDEPENDENT_AMBULATORY_CARE_PROVIDER_SITE_OTHER): Payer: 59 | Admitting: Endocrinology

## 2023-03-23 ENCOUNTER — Ambulatory Visit: Payer: 59 | Admitting: Endocrinology

## 2023-03-23 VITALS — BP 130/60 | HR 95 | Ht 66.0 in | Wt 121.6 lb

## 2023-03-23 DIAGNOSIS — E0590 Thyrotoxicosis, unspecified without thyrotoxic crisis or storm: Secondary | ICD-10-CM

## 2023-03-23 NOTE — Progress Notes (Signed)
Patient ID: Tina [name], female   DOB: 07/17/1956, 67 y.o.   MRN: [id number]          Reason for Appointment: Follow-up of thyroid    History of Present Illness:    The patient's thyroid abnormality was first discovered in 03/2018 She apparently was having a routine exam and screening TSH was low This was repeated and was low again at 0.22 by PCP  On her initial consultation in 11/19 TSH was only borderline at 0.34 She was found to have a right-sided thyroid enlargement Subsequently she had a thyroid scan in 08/2018 which did not show any abnormal uptake  Her last visit was in 2/24 She has had periodic episodes of rapid heartbeat lasting up to 15 minutes not usually associated with exercise and her home heart monitor had only shown nonspecific tachycardia time.  No history of atrial fibrillation She is supposed to be taking Cardizem from her cardiologist for this but has not started this yet  Otherwise she has no symptoms of hyperthyroidism including weight loss, shakiness or sweating  Thyroid levels have since 11/23 been indicating autonomous thyroid function with relatively low TSH but not completely suppressed and normal T4 and T3 levels  Recent results from 08/18/2022  TSH at 0.138, free T4: 1.44, T3 = 2.8  Lab Results  Component Value Date   TSH 0.17 (L) 11/17/2022   TSH 0.39 12/09/2021   TSH 0.47 12/06/2020   FREET4 1.04 11/17/2022   FREET4 0.92 12/09/2021   FREET4 0.96 12/06/2020   Wt Readings from Last 3 Encounters:  03/23/23 121 lb 9.6 oz (55.2 kg)  03/13/23 120 lb 3.2 oz (54.5 kg)  12/23/22 123 lb (55.8 kg)      Allergies as of 03/23/2023       Reactions   Tramadol Other (See Comments)   "It makes my chest feel funny; I don't like to take that"   Cortizone-10 [hydrocortisone]    Shots- Headaches   Lisinopril Other (See Comments)   cough   Vicodin [hydrocodone-acetaminophen] Palpitations, Other (See Comments)   Dizziness, Chest pain          Medication List        Accurate as of March 23, 2023  3:09 PM. If you have any questions, ask your nurse or doctor.          acetaminophen 500 MG tablet Commonly known as: TYLENOL Take 500 mg by mouth every 6 (six) hours as needed.   alendronate 70 MG tablet Commonly known as: FOSAMAX Take 70 mg by mouth once a week.   diclofenac Sodium 1 % Gel Commonly known as: VOLTAREN SMARTSIG:2-4 Gram(s) Topical 3 Times Daily PRN   diltiazem 120 MG 24 hr capsule Commonly known as: Cardizem CD Take 1 capsule (120 mg total) by mouth daily.   estradiol 0.1 MG/GM vaginal cream Commonly known as: ESTRACE Place vaginally.   gabapentin 300 MG capsule Commonly known as: NEURONTIN Take 1 capsule (300 mg total) by mouth 2 (two) times daily.   lovastatin 20 MG tablet Commonly known as: MEVACOR Take 20 mg by mouth at bedtime.   montelukast 10 MG tablet Commonly known as: SINGULAIR Take 10 mg by mouth at bedtime.   omeprazole 20 MG capsule Commonly known as: PRILOSEC Take 1 capsule (20 mg total) by mouth 2 (two) times daily. Take once to twice a day as needed to manage your symptoms   Refresh Relieva 0.5-0.9 % ophthalmic solution Generic drug: carboxymethylcellul-glycerin Place 1 drop into  both eyes as needed for dry eyes.   valsartan 160 MG tablet Commonly known as: DIOVAN Take 160 mg by mouth daily. for high blood pressure   Vitamin D3 25 MCG (1000 UT) Caps Take 1,000 Units by mouth daily.        Allergies:  Allergies  Allergen Reactions   Tramadol Other (See Comments)    "It makes my chest feel funny; I don't like to take that"   Cortizone-10 [Hydrocortisone]     Shots- Headaches    Lisinopril Other (See Comments)    cough   Vicodin [Hydrocodone-Acetaminophen] Palpitations and Other (See Comments)    Dizziness, Chest pain     Past Medical History:  Diagnosis Date   Arthritis    "joints; fingers; shoulders; knees; back" (08/27/2012)   Chronic back pain     epidural injections   Chronic neck pain    myelopathy   COPD (chronic obstructive pulmonary disease) (HCC)    Fibromyalgia    "dx'd long time ago; before they really knew what it was" (112/02/2012)   Gastric ulcer    GERD (gastroesophageal reflux disease)    takes Protonix daily   Heart murmur    History of bronchitis several yrs ago   History of colon polyps    Hyperlipidemia    takes Lovastatin daily   Hypertension    takes Losartan daily   Hypertensive retinopathy    OU   Joint swelling    Osteoporosis    Palpitations    takes Atenolol daily   Pneumonia 2013   hx of   Prediabetes    Scoliosis    Sleep apnea    "have a mask but I don't wear it" (08/27/2012)   Weakness    numbness in hands and feet    Past Surgical History:  Procedure Laterality Date   ANTERIOR CERVICAL DECOMP/DISCECTOMY FUSION N/A 07/16/2016   Procedure: ANTERIOR CERVICAL DECOMPRESSION FUSION CERVICAL 3-4, CERVICAL 4-5, CERVICAL 5-6 WITH INSTRUMENATION AND ALLOGRAFT;  Surgeon: Estill Bamberg, MD;  Location: [facility];  Service: Orthopedics;  Laterality: N/A;  ANTERIOR CERVICAL DECOMPRESSION FUSION CERVICAL 3-4, CERVICAL 4-5, CERVICAL 5-6 WITH INSTRUMENATION AND ALLOGRAFT   BIOPSY  02/06/2022   Procedure: BIOPSY;  Surgeon: Benancio Deeds, MD;  Location: [facility];  Service: Gastroenterology;;   CATARACT EXTRACTION Right 05/18/2017   Dr. Marcell Anger   CATARACT EXTRACTION Left uknown   CATARACT EXTRACTION, BILATERAL  2018   COLONOSCOPY     ESOPHAGOGASTRODUODENOSCOPY     ESOPHAGOGASTRODUODENOSCOPY (EGD) WITH PROPOFOL N/A 02/06/2022   Procedure: ESOPHAGOGASTRODUODENOSCOPY (EGD) WITH PROPOFOL;  Surgeon: Benancio Deeds, MD;  Location: [facility];  Service: Gastroenterology;  Laterality: N/A;   EYE SURGERY Bilateral    Cat Sx   fatty tumor removed from right leg     as a teenager   HIP ARTHROPLASTY  08/27/2012   Procedure: ARTHROPLASTY BIPOLAR HIP;  Surgeon: Velna Ochs, MD;  Location: [facility]  OR;  Service: Orthopedics;  Laterality: Left;   ROTATOR CUFF REPAIR Left 02/2021    Family History  Problem Relation Age of Onset   Throat cancer Mother    Prostate cancer Father    Hypertension Father    Stroke Sister        several   Seizures Sister    Dementia Sister    Stroke Sister    Lung cancer Brother    Breast cancer Maternal Grandmother    Diabetes Other    Thyroid disease Neg Hx  Social History:  reports that she has been smoking cigarettes. She has a 15.00 pack-year smoking history. She has never used smokeless tobacco. She reports that she does not drink alcohol and does not use drugs.   Review of Systems  She is treated for hypertension and hypercholesterolemia by her PCP Also on Fosamax for osteoporosis   Examination:   BP 130/60 (BP Location: Left Arm, Patient Position: Sitting, Cuff Size: Normal)   Pulse 95   Ht 5\' 6"  (1.676 m)   Wt 121 lb 9.6 oz (55.2 kg)   SpO2 97%   BMI 19.63 kg/m    The thyroid is palpable on the right side, about 1.5 times normal and smooth, slightly firm  Left lobe is not palpable  Heart rate regular and about 80    No tremor Hands  not sweaty or warm  Biceps reflexes show normal relaxation   Assessment/Plan:  History of autonomous thyroid function with small right lobe enlargement without abnormal nuclear thyroid scans in the past   Although she has had history of palpitations with rapid heartbeat this is inconsistent and no other symptoms or signs of hyperthyroidism  She does not have any objective signs of hyperthyroidism Only has a small goiter Graves' disease was ruled out with negative thyrotropin receptor antibody previously  Thyroid levels will be checked today and may consider methimazole if TSH is significantly suppressed and free T4 or free T3 upper normal    Reather Littler 03/23/2023

## 2023-03-24 LAB — T4, FREE: Free T4: 0.93 ng/dL (ref 0.60–1.60)

## 2023-03-24 LAB — T3, FREE: T3, Free: 3 pg/mL (ref 2.3–4.2)

## 2023-03-24 LAB — TSH: TSH: 0.4 u[IU]/mL (ref 0.35–5.50)

## 2023-03-24 NOTE — Progress Notes (Signed)
Please call to let patient know that the lab results are normal and no further action needed.  She can follow-up in 6 months

## 2023-03-27 ENCOUNTER — Encounter: Payer: Self-pay | Admitting: Gastroenterology

## 2023-03-30 ENCOUNTER — Ambulatory Visit (HOSPITAL_COMMUNITY)
Admission: RE | Admit: 2023-03-30 | Discharge: 2023-03-30 | Disposition: A | Discharge status: Home / Self Care | Payer: 59 | Source: Ambulatory Visit | Attending: Cardiology | Admitting: Cardiology

## 2023-03-30 DIAGNOSIS — I2510 Atherosclerotic heart disease of native coronary artery without angina pectoris: Secondary | ICD-10-CM | POA: Insufficient documentation

## 2023-03-31 ENCOUNTER — Ambulatory Visit
Admission: RE | Admit: 2023-03-31 | Discharge: 2023-03-31 | Disposition: A | Discharge status: Home / Self Care | Payer: 59 | Source: Ambulatory Visit | Attending: Orthopaedic Surgery | Admitting: Orthopaedic Surgery

## 2023-03-31 DIAGNOSIS — M25512 Pain in left shoulder: Secondary | ICD-10-CM

## 2023-04-01 NOTE — Progress Notes (Signed)
Called and spoke with patient regarding her CT cardiac scoring results.

## 2023-04-13 ENCOUNTER — Other Ambulatory Visit: Payer: Self-pay | Admitting: Cardiology

## 2023-04-13 DIAGNOSIS — R002 Palpitations: Secondary | ICD-10-CM

## 2023-04-13 DIAGNOSIS — I471 Supraventricular tachycardia, unspecified: Secondary | ICD-10-CM

## 2023-04-17 LAB — CMP14+EGFR: Potassium: 4.4 mmol/L (ref 3.5–5.2)

## 2023-04-20 NOTE — Progress Notes (Signed)
Patient called back to inform us that she has seen her endocrinologists 2 weeks ago.

## 2023-04-20 NOTE — Progress Notes (Signed)
Called patient no answer left v.m.

## 2023-04-21 ENCOUNTER — Other Ambulatory Visit: Payer: Self-pay | Admitting: Orthopaedic Surgery

## 2023-04-21 DIAGNOSIS — M542 Cervicalgia: Secondary | ICD-10-CM

## 2023-04-24 ENCOUNTER — Ambulatory Visit: Payer: 59 | Admitting: Cardiology

## 2023-04-24 ENCOUNTER — Encounter: Payer: Self-pay | Admitting: Cardiology

## 2023-04-24 VITALS — BP 125/84 | HR 90 | Resp 16 | Ht 66.0 in | Wt 121.6 lb

## 2023-04-24 DIAGNOSIS — R002 Palpitations: Secondary | ICD-10-CM

## 2023-04-24 DIAGNOSIS — F17210 Nicotine dependence, cigarettes, uncomplicated: Secondary | ICD-10-CM

## 2023-04-24 DIAGNOSIS — I10 Essential (primary) hypertension: Secondary | ICD-10-CM

## 2023-04-24 DIAGNOSIS — I2510 Atherosclerotic heart disease of native coronary artery without angina pectoris: Secondary | ICD-10-CM

## 2023-04-24 DIAGNOSIS — E782 Mixed hyperlipidemia: Secondary | ICD-10-CM

## 2023-04-24 NOTE — Progress Notes (Signed)
ID:  Tina [name], DOB 1955/11/04, MRN [id number]  PCP:  Karene Fry Gerome Apley, PA-C  Cardiologist:  Tessa Lerner, DO, [facility] (established care 01/27/2022)  Date: 04/24/23 Last Office Visit: 03/13/2023  Chief Complaint  Patient presents with   Corornary artery calcification   Results   Follow-up    6 weeks    HPI  Tina [name] is a 67 y.o. African-American female whose past medical history and cardiovascular risk factors include: Coronary artery calcification (nongated CT study), hypertension, GERD, cervical myelopathy status post ACDF C3-C6, cigarette smoker (0.5ppd).  Patient was referred to the practice for evaluation of palpitations in the past she did undergo Zio patch which illustrated underlying rhythm to be sinus with tachycardia burden of approximately 14%.  PVC burden of approximately 3%.  At the last office visit she was transition from amlodipine to diltiazem.  In addition, she had a nongated CT study which illustrated coronary calcification.  At last office visit she voiced undergoing a dedicated coronary calcium score to help quantify the severity of CAC.  She presents today for follow-up.  Denies anginal chest pain or heart failure symptoms.  Her palpitations have improved after initiation of diltiazem but completely not resolved.  She was noted to have low TSH levels and has followed up with endocrinologist Dr. Lucianne Muss for further evaluation for thyroid disease.  Given her coronary calcification she was recommended to be on aspirin.  However she is currently holding aspirin as she is been worked up for additional spine imaging and potential procedures.  Once workup is complete patient is aware to restart aspirin when medically safe.  Patient is now requesting that her high sensitive CRP levels be checked.  Unfortunately, she continues to smoke 1 pack/day.   ALLERGIES: Allergies  Allergen Reactions   Tramadol Other (See Comments)    "It makes my chest feel  funny; I don't like to take that"   Cortizone-10 [Hydrocortisone]     Shots- Headaches    Lisinopril Other (See Comments)    cough   Vicodin [Hydrocodone-Acetaminophen] Palpitations and Other (See Comments)    Dizziness, Chest pain     MEDICATION LIST PRIOR TO VISIT: Current Meds  Medication Sig   acetaminophen (TYLENOL) 500 MG tablet Take 500 mg by mouth every 6 (six) hours as needed.   alendronate (FOSAMAX) 70 MG tablet Take 70 mg by mouth once a week.   carboxymethylcellul-glycerin (REFRESH RELIEVA) 0.5-0.9 % ophthalmic solution Place 1 drop into both eyes as needed for dry eyes.   Cholecalciferol (VITAMIN D3) 25 MCG (1000 UT) CAPS Take 1,000 Units by mouth daily.   diclofenac Sodium (VOLTAREN) 1 % GEL    diltiazem (CARDIZEM CD) 120 MG 24 hr capsule TAKE 1 CAPSULE BY MOUTH EVERY DAY   estradiol (ESTRACE) 0.1 MG/GM vaginal cream Place vaginally.   gabapentin (NEURONTIN) 300 MG capsule Take 1 capsule (300 mg total) by mouth 2 (two) times daily.   lovastatin (MEVACOR) 20 MG tablet Take 20 mg by mouth at bedtime.   montelukast (SINGULAIR) 10 MG tablet Take 10 mg by mouth at bedtime.   omeprazole (PRILOSEC) 20 MG capsule Take 1 capsule (20 mg total) by mouth 2 (two) times daily. Take once to twice a day as needed to manage your symptoms   valsartan (DIOVAN) 160 MG tablet Take 160 mg by mouth daily. for high blood pressure     PAST MEDICAL HISTORY: Past Medical History:  Diagnosis Date   Arthritis    "joints; fingers; shoulders;  knees; back" (08/27/2012)   Chronic back pain    epidural injections   Chronic neck pain    myelopathy   COPD (chronic obstructive pulmonary disease) (HCC)    Fibromyalgia    "dx'd long time ago; before they really knew what it was" (112/02/2012)   Gastric ulcer    GERD (gastroesophageal reflux disease)    takes Protonix daily   Heart murmur    History of bronchitis several yrs ago   History of colon polyps    Hyperlipidemia    takes Lovastatin daily    Hypertension    takes Losartan daily   Hypertensive retinopathy    OU   Joint swelling    Osteoporosis    Palpitations    takes Atenolol daily   Pneumonia 2013   hx of   Prediabetes    Scoliosis    Sleep apnea    "have a mask but I don't wear it" (08/27/2012)   Weakness    numbness in hands and feet    PAST SURGICAL HISTORY: Past Surgical History:  Procedure Laterality Date   ANTERIOR CERVICAL DECOMP/DISCECTOMY FUSION N/A 07/16/2016   Procedure: ANTERIOR CERVICAL DECOMPRESSION FUSION CERVICAL 3-4, CERVICAL 4-5, CERVICAL 5-6 WITH INSTRUMENATION AND ALLOGRAFT;  Surgeon: Estill Bamberg, MD;  Location: [facility] OR;  Service: Orthopedics;  Laterality: N/A;  ANTERIOR CERVICAL DECOMPRESSION FUSION CERVICAL 3-4, CERVICAL 4-5, CERVICAL 5-6 WITH INSTRUMENATION AND ALLOGRAFT   BIOPSY  02/06/2022   Procedure: BIOPSY;  Surgeon: Benancio Deeds, MD;  Location: [facility] ENDOSCOPY;  Service: Gastroenterology;;   CATARACT EXTRACTION Right 05/18/2017   Dr. Marcell Anger   CATARACT EXTRACTION Left uknown   CATARACT EXTRACTION, BILATERAL  2018   COLONOSCOPY     ESOPHAGOGASTRODUODENOSCOPY     ESOPHAGOGASTRODUODENOSCOPY (EGD) WITH PROPOFOL N/A 02/06/2022   Procedure: ESOPHAGOGASTRODUODENOSCOPY (EGD) WITH PROPOFOL;  Surgeon: Benancio Deeds, MD;  Location: [facility] END[facility];  Service: Gastroenterology;  Laterality: N/A;   EYE SURGERY Bilateral    Cat Sx   fatty tumor removed from right leg     as a teenager   HIP ARTHROPLASTY  08/27/2012   Procedure: ARTHROPLASTY BIPOLAR HIP;  Surgeon: Velna Ochs, MD;  Location: [facility] OR;  Service: Orthopedics;  Laterality: Left;   ROTATOR CUFF REPAIR Left 02/2021    FAMILY HISTORY: The patient family history includes Breast cancer in her maternal grandmother; Dementia in her sister; Diabetes in an other family member; Hypertension in her father; Lung cancer in her brother; Prostate cancer in her father; Seizures in her sister; Stroke in her sister and sister; Throat  cancer in her mother.  SOCIAL HISTORY:  The patient  reports that she has been smoking cigarettes. She has a 15 pack-year smoking history. She has never used smokeless tobacco. She reports that she does not drink alcohol and does not use drugs.  REVIEW OF SYSTEMS: Review of Systems  Cardiovascular:  Positive for palpitations. Negative for chest pain, dyspnea on exertion, leg swelling, near-syncope, orthopnea, paroxysmal nocturnal dyspnea and syncope.  Respiratory:  Positive for shortness of breath (chronic).     PHYSICAL EXAM:    04/24/2023    1:45 PM 03/23/2023    2:57 PM 03/13/2023    1:36 PM  Vitals with BMI  Height 5\' 6"  5\' 6"  5\' 5"   Weight 121 lbs 10 oz 121 lbs 10 oz 120 lbs 3 oz  BMI 19.64 19.64 20  Systolic 125 130 188  Diastolic 84 60 72  Pulse 90 95 91   Physical Exam  Constitutional: No distress.  Age appropriate, hemodynamically stable.   Neck: No JVD present.  Cardiovascular: Normal rate, regular rhythm, S1 normal, S2 normal, intact distal pulses and normal pulses. Exam reveals no gallop, no S3 and no S4.  No murmur heard. Pulmonary/Chest: Effort normal and breath sounds normal. No stridor. She has no wheezes. She has no rales.  Abdominal: Soft. Bowel sounds are normal. She exhibits no distension. There is no abdominal tenderness.  Musculoskeletal:        General: No edema.     Cervical back: Neck supple.  Neurological: She is alert and oriented to person, place, and time. She has intact cranial nerves (2-12).  Skin: Skin is warm and moist.   RADIOLOGY: CT of the chest without contrast lung cancer screening (Care Everywhere 08/14/2021): Per report coronary artery calcification, no pericardial effusion, heart normal size, normal caliber thoracic aorta  CARDIAC DATABASE: EKG: 03/13/2023: Sinus rhythm, 79 bpm,Old anteroseptal infarct, without underlying ischemia injury pattern.  Echocardiogram: Echocardiogram 02/07/2022:  Normal LV systolic function with EF 61%.  Left ventricle cavity is normal in size. Normal left ventricular wall thickness. Normal global wall motion. Normal diastolic filling pattern. Calculated EF 61%.  Structurally normal tricuspid valve.  Moderate tricuspid regurgitation.  Mild pulmonary hypertension. RVSP measures 34 mmHg.  Pericardium is normal. Small posterior pericardial effusion with clear fluids.   Stress Testing: 06/30/2016 Care Everywhere: - Normal myocardial perfusion study  - No evidence for significant ischemia or scar is noted.  - Post stress: Global systolic function is normal. The ejection fraction was greater than 65%.  - Minimal coronary calcifications are noted   Lexiscan Nuclear stress test 02/24/2022: Nondiagnostic ECG stress. The heart rate response was consistent with Regadenoson.  Myocardial perfusion is normal. Overall LV systolic function is normal without regional wall motion abnormalities. Stress LV EF: 72%.  No previous exam available for comparison. Low risk.   Coronary Calcium Score: March 30, 2023 Left main: 0 Left anterior descending artery: 508 Left circumflex artery: 165 Right coronary artery: 0 Total: 673 Percentile: 97th Aortic Atherosclerosis (ICD10-I70.0) and Emphysema (ICD10-J43.9).  Carotid artery duplex 02/07/2022: Duplex suggests stenosis in the right internal carotid artery (1-15%). Duplex suggests stenosis in the left internal carotid artery (1-15%). Left bulb with <50% stenosis. There is mild homogenous plaque noted in the bilateral carotid arteries. Antegrade right vertebral artery flow. Antegrade left vertebral artery flow. Follow up in one year is appropriate if clinically indicated.  Cardiac monitor (Zio Patch): February 24, 2022-March 10, 2022 Dominant rhythm sinus, followed by tachycardia (burden 14%). Heart rate 54-214 bpm. Avg HR 86 bpm. No atrial fibrillation, ventricular tachycardia, high grade AV block, pauses (3 seconds or longer). Total ventricular ectopic burden  1.3%.  Rare episodes of paroxysmal supraventricular tachycardia (fastest episode 6 beats, 2.6 seconds in duration, avg HR 178 bpm and max HR 214 bpm). Total supraventricular ectopic burden <1%. Patient triggered events: 7. Underlying rhythm sinus without ectopy/dysrhythmia.   LABORATORY DATA:    Latest Ref Rng & Units 04/16/2023   12:29 PM 02/07/2022    9:47 AM 02/26/2017    5:47 PM  CBC  WBC 4.0 - 10.5 K/uL   6.8   Hemoglobin 11.1 - 15.9 g/dL 16.1  09.6  04.5   Hematocrit 34.0 - 46.6 % 41.1  37.1  41.6   Platelets 150 - 400 K/uL   205        Latest Ref Rng & Units 04/16/2023   12:29 PM 02/07/2022    9:47 AM  08/18/2018    2:02 PM  CMP  Glucose 70 - 99 mg/dL 045  409  99   BUN 8 - 27 mg/dL 17  19  15    Creatinine 0.57 - 1.00 mg/dL 8.11  9.14  7.82   Sodium 134 - 144 mmol/L 144  142  137   Potassium 3.5 - 5.2 mmol/L 4.4  4.1  3.8   Chloride 96 - 106 mmol/L 106  104  104   CO2 20 - 29 mmol/L 26  24  28    Calcium 8.7 - 10.3 mg/dL 9.7  9.3  9.5   Total Protein 6.0 - 8.5 g/dL 6.4  6.3    Total Bilirubin 0.0 - 1.2 mg/dL 0.9  0.9    Alkaline Phos 44 - 121 IU/L 67  64    AST 0 - 40 IU/L 16  17    ALT 0 - 32 IU/L 14  12      Lipid Panel  Lab Results  Component Value Date   CHOL 152 04/16/2023   HDL 90 04/16/2023   LDLCALC 51 04/16/2023   LDLDIRECT 59 04/16/2023   TRIG 49 04/16/2023   CHOLHDL 2.6 08/28/2012    No components found for: "NTPROBNP" No results for input(s): "PROBNP" in the last 8760 hours. Recent Labs    11/17/22 1454 03/23/23 1540 04/16/23 1229  TSH 0.17* 0.40 0.430*    BMP Recent Labs    04/16/23 1229  NA 144  K 4.4  CL 106  CO2 26  GLUCOSE 111*  BUN 17  CREATININE 0.99  CALCIUM 9.7     HEMOGLOBIN A1C Lab Results  Component Value Date   HGBA1C 5.9 (H) 06/19/2016   MPG 123 06/19/2016    IMPRESSION:    ICD-10-CM   1. Coronary artery calcification seen on computed tomography  I25.10     2. Palpitations  R00.2     3. Mixed  hyperlipidemia  E78.2     4. Benign essential HTN  I10     5. Cigarette smoker  F17.210         RECOMMENDATIONS: Sanaiyah Kirchhoff is a 67 y.o. African-American female whose past medical history and cardiac risk factors include: Coronary artery calcification (nongated CT study), hypertension, GERD, cervical myelopathy status post ACDF C3-C6, cigarette smoker (0.5ppd).  Coronary artery calcification seen on computed tomography Total CAC 673, 97th percentile. Already on statin therapy. Most recent lipids noted direct LDL 59 mg/dL. She is currently undergoing additional imaging of her spine and may need procedures and therefore is holding off aspirin.  This is reasonable but once workup is complete and medically safe she will restart aspirin 81 mg p.o. daily. She has already undergone ischemic workup as outlined above and given the fact that she has no anginal chest pain would not recommend additional testing at this time. Patient wants to have her high sensitive CRP levels checked.  I informed her that this is a marker of inflammation and given the degree of cigarette smoking, coronary artery calcification overall diagnostic yield is low.  In addition she is already undergone cardiovascular workup that otherwise would be ordered.  Palpitations Improving. Continue current dose of diltiazem. She will continue the medications for now but if the symptoms are still not well-controlled may consider up titration. Labs noted low TSH levels she has followed up with endocrinology.   Appreciate Dr. Remus Blake recommendations and workup.  Mixed hyperlipidemia Continue current therapy. Most recent lipid profile from 04/16/2023 independently reviewed.  Benign essential HTN Office blood pressures are well-controlled.  Continue current medical therapy. No changes warranted.  Cigarette smoker Tobacco cessation counseling: Currently smoking 1 packs/day   Patient was informed of the dangers of  tobacco abuse including stroke, cancer, and MI, as well as benefits of tobacco cessation. Patient is not willing to quit at this time. 5 mins were spent counseling patient cessation techniques. We discussed various methods to help quit smoking, including deciding on a date to quit, joining a support group, pharmacological agents- nicotine gum/patch/lozenges.  I will reassess her progress at the next follow-up visit  FINAL MEDICATION LIST END OF ENCOUNTER: No orders of the defined types were placed in this encounter.   There are no discontinued medications.    Current Outpatient Medications:    acetaminophen (TYLENOL) 500 MG tablet, Take 500 mg by mouth every 6 (six) hours as needed., Disp: , Rfl:    alendronate (FOSAMAX) 70 MG tablet, Take 70 mg by mouth once a week., Disp: , Rfl:    carboxymethylcellul-glycerin (REFRESH RELIEVA) 0.5-0.9 % ophthalmic solution, Place 1 drop into both eyes as needed for dry eyes., Disp: , Rfl:    Cholecalciferol (VITAMIN D3) 25 MCG (1000 UT) CAPS, Take 1,000 Units by mouth daily., Disp: , Rfl:    diclofenac Sodium (VOLTAREN) 1 % GEL, , Disp: , Rfl:    diltiazem (CARDIZEM CD) 120 MG 24 hr capsule, TAKE 1 CAPSULE BY MOUTH EVERY DAY, Disp: 30 capsule, Rfl: 0   estradiol (ESTRACE) 0.1 MG/GM vaginal cream, Place vaginally., Disp: , Rfl:    gabapentin (NEURONTIN) 300 MG capsule, Take 1 capsule (300 mg total) by mouth 2 (two) times daily., Disp: 180 capsule, Rfl: 3   lovastatin (MEVACOR) 20 MG tablet, Take 20 mg by mouth at bedtime., Disp: , Rfl:    montelukast (SINGULAIR) 10 MG tablet, Take 10 mg by mouth at bedtime., Disp: , Rfl:    omeprazole (PRILOSEC) 20 MG capsule, Take 1 capsule (20 mg total) by mouth 2 (two) times daily. Take once to twice a day as needed to manage your symptoms, Disp: 60 capsule, Rfl: 2   valsartan (DIOVAN) 160 MG tablet, Take 160 mg by mouth daily. for high blood pressure, Disp: , Rfl: 1  No orders of the defined types were placed in this  encounter.   There are no Patient Instructions on file for this visit.   --Continue cardiac medications as reconciled in final medication list. --Return in about 6 months (around 10/25/2023) for Follow up, Coronary artery calcification. Or sooner if needed. --Continue follow-up with your primary care physician regarding the management of your other chronic comorbid conditions.  Patient's questions and concerns were addressed to her satisfaction. She voices understanding of the instructions provided during this encounter.   This note was created using a voice recognition software as a result there may be grammatical errors inadvertently enclosed that do not reflect the nature of this encounter. Every attempt is made to correct such errors.  Tessa Lerner, [place], [facility]  Pager:  [phone] Office: [phone]

## 2023-04-27 ENCOUNTER — Telehealth: Payer: Self-pay | Admitting: Endocrinology

## 2023-04-27 NOTE — Telephone Encounter (Signed)
Patient is advising that  she had labs done at her Cardiologist and they advised her that her Thyroid panel was low and she needed to speak to her Thyroid Doctor. Please advise

## 2023-04-28 NOTE — Telephone Encounter (Signed)
Patient is calling back to ask if Dr. Lucianne Muss thinks that she has Thyroid Disease.

## 2023-04-28 NOTE — Telephone Encounter (Signed)
Patient is aware 

## 2023-05-07 ENCOUNTER — Other Ambulatory Visit: Payer: Self-pay | Admitting: Cardiology

## 2023-05-07 DIAGNOSIS — I471 Supraventricular tachycardia, unspecified: Secondary | ICD-10-CM

## 2023-05-07 DIAGNOSIS — R002 Palpitations: Secondary | ICD-10-CM

## 2023-05-11 ENCOUNTER — Ambulatory Visit
Admission: RE | Admit: 2023-05-11 | Discharge: 2023-05-11 | Disposition: A | Discharge status: Home / Self Care | Payer: 59 | Source: Ambulatory Visit | Attending: Orthopaedic Surgery | Admitting: Orthopaedic Surgery

## 2023-05-11 DIAGNOSIS — M542 Cervicalgia: Secondary | ICD-10-CM

## 2023-06-09 ENCOUNTER — Other Ambulatory Visit: Payer: Self-pay | Admitting: Physician Assistant

## 2023-06-09 DIAGNOSIS — Z1231 Encounter for screening mammogram for malignant neoplasm of breast: Secondary | ICD-10-CM

## 2023-06-10 ENCOUNTER — Other Ambulatory Visit (HOSPITAL_COMMUNITY): Payer: Self-pay | Admitting: Orthopedic Surgery

## 2023-06-10 DIAGNOSIS — M25552 Pain in left hip: Secondary | ICD-10-CM

## 2023-06-26 ENCOUNTER — Encounter (HOSPITAL_COMMUNITY)
Admission: RE | Admit: 2023-06-26 | Discharge: 2023-06-26 | Disposition: A | Discharge status: Home / Self Care | Payer: 59 | Source: Ambulatory Visit | Attending: Orthopedic Surgery | Admitting: Orthopedic Surgery

## 2023-06-26 ENCOUNTER — Encounter (HOSPITAL_COMMUNITY): Payer: 59

## 2023-06-26 DIAGNOSIS — M25552 Pain in left hip: Secondary | ICD-10-CM | POA: Diagnosis present

## 2023-06-26 MED ORDER — TECHNETIUM TC 99M MEDRONATE IV KIT
20.0000 | PACK | Freq: Once | INTRAVENOUS | Status: AC | PRN
  Administered 2023-06-26: 20 via INTRAVENOUS

## 2023-09-14 ENCOUNTER — Ambulatory Visit
Admission: RE | Admit: 2023-09-14 | Discharge: 2023-09-14 | Disposition: A | Discharge status: Home / Self Care | Payer: 59 | Source: Ambulatory Visit | Attending: Physician Assistant | Admitting: Physician Assistant

## 2023-09-14 DIAGNOSIS — Z1231 Encounter for screening mammogram for malignant neoplasm of breast: Secondary | ICD-10-CM

## 2023-09-29 ENCOUNTER — Ambulatory Visit: Payer: 59 | Admitting: Endocrinology

## 2023-09-29 ENCOUNTER — Telehealth: Payer: Self-pay | Admitting: Gastroenterology

## 2023-09-29 NOTE — Telephone Encounter (Signed)
 Inbound call from patient stating that she is scheduled to see Dr. Adela Lank on 3/12 and she normally has a MRI before she comes to see him. Patient is wanting to know if she needs to have that before this appointment. Please advise.

## 2023-09-29 NOTE — Telephone Encounter (Signed)
 Patient is advised that per Dr Jacqualine last note, he would like to first discuss in office with her the need for additional MR testing, so no imaging needed prior to this office visit. She verbalizes understanding.  Elspeth SHAUNNA Naval, MD 11/30/2022 12:18 PM EDT     Jan can you please let the patient know her MRCP shows stable pancreatic cysts. Radiology recommending another exam in 1 year although given stability over time at some point we may stop pursuing that. I would like to see her back in the office in one year to discuss it, if you can place a follow up recall. Thanks

## 2023-10-23 ENCOUNTER — Encounter: Payer: Self-pay | Admitting: Endocrinology

## 2023-10-23 ENCOUNTER — Ambulatory Visit (INDEPENDENT_AMBULATORY_CARE_PROVIDER_SITE_OTHER): Payer: 59 | Admitting: Endocrinology

## 2023-10-23 VITALS — BP 132/62 | HR 88 | Resp 20 | Ht 66.0 in | Wt 122.0 lb

## 2023-10-23 DIAGNOSIS — E049 Nontoxic goiter, unspecified: Secondary | ICD-10-CM | POA: Diagnosis not present

## 2023-10-23 DIAGNOSIS — E0590 Thyrotoxicosis, unspecified without thyrotoxic crisis or storm: Secondary | ICD-10-CM | POA: Diagnosis not present

## 2023-10-23 NOTE — Progress Notes (Signed)
Outpatient Endocrinology Note Tina Davaun Quintela, MD   Patient's Name: Tina [name]    DOB: 13-Oct-1955    MRN: [id number]  REASON OF VISIT: Follow-up for subclinical hyperthyroidism  PCP: Driggers, Gerome Apley, PA-C  HISTORY OF PRESENT ILLNESS:   Tina [name] is a 68 y.o. old female with past medical history as listed below is presented for new consult for subclinical hyperthyroidism.   Pertinent Thyroid History: Patient was previously seen by Dr. Lucianne Muss and was last time seen in July 2024.  Patient's thyroid abnormality was first discovered in July 2019, at that time his screening TSH was low.  Repeat TSH was again low at 0.22, later in November 2019 TSH was 0.34 mildly low.  She had radioactive iodine uptake and scan in December 2019 which did not show any abnormal uptake.  She has no hyperthyroid symptoms ago other than occasional palpitation.  She has occasional low TSH with normal free T4 and free T3.  She had normal thyroid function test in July,1, 2024.  Repeat TSH was again 0.430 mildly low in April 16, 2023.  Patient has never been on thyroid medication.  She had negative thyrotropin receptor antibody in the past.   Latest Reference Range & Units 11/17/22 14:54  Thyrotropin Receptor Ab 0.00 - 1.75 IU/L <1.10   - Patient has problem of palpitation, evaluated by cardiology, no diagnosis of arrhythmia, has been on Cardizem.  Interval history  Patient presented for the follow-up of subclinical hyperthyroidism.  She denies recent palpitation, has been following with cardiology and taking Cardizem.  Denies heat intolerance.  Denies increased sweating.  She complains of not able to gain weight.  Her body weight has been relatively stable.  No change in bowel habit, constipation or diarrhea.  She has not been on thyroid medication.  No redness or watering of the eyes.  No other complaints today.  Heart rate 88 today.  REVIEW OF SYSTEMS:  As per history of present  illness.   PAST MEDICAL HISTORY: Past Medical History:  Diagnosis Date   Arthritis    "joints; fingers; shoulders; knees; back" (08/27/2012)   Chronic back pain    epidural injections   Chronic neck pain    myelopathy   COPD (chronic obstructive pulmonary disease) (HCC)    Fibromyalgia    "dx'd long time ago; before they really knew what it was" (112/02/2012)   Gastric ulcer    GERD (gastroesophageal reflux disease)    takes Protonix daily   Heart murmur    History of bronchitis several yrs ago   History of colon polyps    Hyperlipidemia    takes Lovastatin daily   Hypertension    takes Losartan daily   Hypertensive retinopathy    OU   Joint swelling    Osteoporosis    Palpitations    takes Atenolol daily   Pneumonia 2013   hx of   Prediabetes    Scoliosis    Sleep apnea    "have a mask but I don't wear it" (08/27/2012)   Weakness    numbness in hands and feet    PAST SURGICAL HISTORY: Past Surgical History:  Procedure Laterality Date   ANTERIOR CERVICAL DECOMP/DISCECTOMY FUSION N/A 07/16/2016   Procedure: ANTERIOR CERVICAL DECOMPRESSION FUSION CERVICAL 3-4, CERVICAL 4-5, CERVICAL 5-6 WITH INSTRUMENATION AND ALLOGRAFT;  Surgeon: Estill Bamberg, MD;  Location[facility];  Service: Orthopedics;  Laterality: N/A;  ANTERIOR CERVICAL DECOMPRESSION FUSION CERVICAL 3-4, CERVICAL 4-5, CERVICAL 5-6 WITH INSTRUMENATION AND  ALLOGRAFT   BIOPSY  02/06/2022   Procedure: BIOPSY;  Surgeon: Benancio Deeds, MD;  Location: [facility];  Service: Gastroenterology;;   CATARACT EXTRACTION Right 05/18/2017   Dr. Marcell Anger   CATARACT EXTRACTION Left uknown   CATARACT EXTRACTION, BILATERAL  2018   COLONOSCOPY     ESOPHAGOGASTRODUODENOSCOPY     ESOPHAGOGASTRODUODENOSCOPY (EGD) WITH PROPOFOL N/A 02/06/2022   Procedure: ESOPHAGOGASTRODUODENOSCOPY (EGD) WITH PROPOFOL;  Surgeon: Benancio Deeds, MD;  Location: [facility];  Service: Gastroenterology;  Laterality: N/A;   EYE SURGERY  Bilateral    Cat Sx   fatty tumor removed from right leg     as a teenager   HIP ARTHROPLASTY  08/27/2012   Procedure: ARTHROPLASTY BIPOLAR HIP;  Surgeon: Velna Ochs, MD;  Location: [facility];  Service: Orthopedics;  Laterality: Left;   ROTATOR CUFF REPAIR Left 02/2021    ALLERGIES: Allergies  Allergen Reactions   Tramadol Other (See Comments)    "It makes my chest feel funny; I don't like to take that"   Cortizone-10 [Hydrocortisone]     Shots- Headaches    Lisinopril Other (See Comments)    cough   Vicodin [Hydrocodone-Acetaminophen] Palpitations and Other (See Comments)    Dizziness, Chest pain     FAMILY HISTORY:  Family History  Problem Relation Age of Onset   Throat cancer Mother    Prostate cancer Father    Hypertension Father    Stroke Sister        several   Seizures Sister    Dementia Sister    Stroke Sister    Lung cancer Brother    Breast cancer Maternal Grandmother    Diabetes Other    Thyroid disease Neg Hx     SOCIAL HISTORY: Social History   Socioeconomic History   Marital status: Divorced    Spouse name: Not on file   Number of children: 2   Years of education: Not on file   Highest education level: Not on file  Occupational History   Not on file  Tobacco Use   Smoking status: Every Day    Current packs/day: 0.50    Average packs/day: 0.5 packs/day for 30.0 years (15.0 ttl pk-yrs)    Types: Cigarettes   Smokeless tobacco: Never  Vaping Use   Vaping status: Never Used  Substance and Sexual Activity   Alcohol use: No   Drug use: No   Sexual activity: Never  Other Topics Concern   Not on file  Social History Narrative   Right handed   Lives in single story home alone   Occasional caffeine intake   Social Drivers of Corporate investment banker Strain: Not on file  Food Insecurity: Not on file  Transportation Needs: Not on file  Physical Activity: Not on file  Stress: Not on file  Social Connections: Not on file     MEDICATIONS:  Current Outpatient Medications  Medication Sig Dispense Refill   acetaminophen (TYLENOL) 500 MG tablet Take 500 mg by mouth every 6 (six) hours as needed.     alendronate (FOSAMAX) 70 MG tablet Take 70 mg by mouth once a week.     carboxymethylcellul-glycerin (REFRESH RELIEVA) 0.5-0.9 % ophthalmic solution Place 1 drop into both eyes as needed for dry eyes.     Cholecalciferol (VITAMIN D3) 25 MCG (1000 UT) CAPS Take 1,000 Units by mouth daily.     diltiazem (CARDIZEM CD) 120 MG 24 hr capsule TAKE 1 CAPSULE BY MOUTH EVERY DAY 90  capsule 1   gabapentin (NEURONTIN) 300 MG capsule Take 1 capsule (300 mg total) by mouth 2 (two) times daily. 180 capsule 3   lovastatin (MEVACOR) 20 MG tablet Take 20 mg by mouth at bedtime.     montelukast (SINGULAIR) 10 MG tablet Take 10 mg by mouth at bedtime.     omeprazole (PRILOSEC) 20 MG capsule Take 1 capsule (20 mg total) by mouth 2 (two) times daily. Take once to twice a day as needed to manage your symptoms 60 capsule 2   valsartan (DIOVAN) 160 MG tablet Take 160 mg by mouth daily. for high blood pressure  1   diclofenac Sodium (VOLTAREN) 1 % GEL  (Patient not taking: Reported on 10/23/2023)     estradiol (ESTRACE) 0.1 MG/GM vaginal cream Place vaginally.     No current facility-administered medications for this visit.    PHYSICAL EXAM: Vitals:   10/23/23 1112  BP: 132/62  Pulse: 88  Resp: 20  SpO2: 98%  Weight: 122 lb (55.3 kg)  Height: 5\' 6"  (1.676 m)   Body mass index is 19.69 kg/m.  Wt Readings from Last 3 Encounters:  10/23/23 122 lb (55.3 kg)  04/24/23 121 lb 9.6 oz (55.2 kg)  03/23/23 121 lb 9.6 oz (55.2 kg)    General: Well developed, well nourished female in no apparent distress.  HEENT: AT/[place], no external lesions. Hearing intact to the spoken word Eyes: EOMI. No stare, proptosis or lid lag. Conjunctiva clear and no icterus. No erythema or watering Neck: Trachea midline, neck supple with mild appreciable  thyromegaly R > L or no lymphadenopathy and no palpable thyroid nodules Lungs: Clear to auscultation, no wheeze. Respirations not labored Heart: S1S2, Regular in rate and rhythm.  Abdomen: Soft, non tender, non distended Neurologic: Alert, oriented, normal speech, deep tendon biceps reflexes normal,  no gross focal neurological deficit Extremities: No pedal pitting edema, no tremors of outstretched hands Skin: Warm, color good.  Psychiatric: Does not appear depressed or anxious  PERTINENT HISTORIC LABORATORY AND IMAGING STUDIES:  All pertinent laboratory results were reviewed. Please see HPI also for further details.   TSH  Date Value Ref Range Status  04/16/2023 0.430 (L) 0.450 - 4.500 uIU/mL Final  03/23/2023 0.40 0.35 - 5.50 uIU/mL Final  11/17/2022 0.17 (L) 0.35 - 5.50 uIU/mL Final    Lab Results  Component Value Date   FREET4 0.93 03/23/2023   FREET4 1.04 11/17/2022   FREET4 0.92 12/09/2021   T3FREE 3.0 03/23/2023   T3FREE 2.9 11/17/2022   T3FREE 3.2 12/09/2021   TSH 0.430 (L) 04/16/2023   TSH 0.40 03/23/2023   TSH 0.17 (L) 11/17/2022    Lab Results  Component Value Date   THYROTRECAB <1.10 11/17/2022    Lab Results  Component Value Date   TSH 0.430 (L) 04/16/2023   TSH 0.40 03/23/2023   TSH 0.17 (L) 11/17/2022   FREET4 0.93 03/23/2023   FREET4 1.04 11/17/2022   FREET4 0.92 12/09/2021     No results found for: "TSI"   No components found for: "TRAB"    CLINICAL DATA:  Low TSH, sore throat, RIGHT thyroid nodule   EXAM: THYROID SCAN in August 27, 2018.   TECHNIQUE: Following the intravenous administration of radiopharmaceutical, pin hole collimated images were obtained of the thyroid gland.   RADIOPHARMACEUTICALS:  10.2 mCi Technetium-58m pertechnetate IV   COMPARISON:  None   FINDINGS: Homogeneous tracer distribution in both thyroid lobes.   No focal areas of increased or decreased tracer  localization seen.   Subjective uptake of pertechnetate  is normal.   IMPRESSION: Normal exam.  ASSESSMENT / PLAN  1. Subclinical hyperthyroidism   2. Goiter    -Patient has reviewed her intermittently low TSH, with normal free T4 and free T3, at least from 2019.  Patient had normal radioactive iodine uptake and scan in December 2019.  Most recent thyroid function test was normal TSH on July 1 with normal free T4 and free T3 and mild low TSH on April 16, 2023.  She had negative thyrotropin receptor antibody in the past negative for autoimmune hyperthyroidism.  She did not have ultrasound thyroid in the past. -She has complaints of occasional palpitation, evaluated and managed by cardiology on Cardizem currently.  She is clinically euthyroid in the clinic today.  No obvious hyperthyroid clinical features.  On physical exam she has mild thyromegaly.  Plan: -Check thyroid function test TSH, free T4, free T3 -Would like to check thyroid autoantibodies including TRAb, TSI for hyperthyroidism /Graves' disease and thyroglobulin and thyroid peroxidase antibodies for Hashimoto's thyroiditis. -If she continues to have low TSH we will consider ultrasound thyroid. -If she continues to have low TSH we will have a trial of low-dose of methimazole.  Diagnoses and all orders for this visit:  Subclinical hyperthyroidism -     T4, free -     T3, free -     TSH -     TRAb (TSH Receptor Binding Antibody) -     Thyroid peroxidase antibody -     Thyroglobulin antibody -     Thyroid stimulating immunoglobulin  Goiter -     T4, free -     T3, free -     TSH -     TRAb (TSH Receptor Binding Antibody) -     Thyroid peroxidase antibody -     Thyroglobulin antibody -     Thyroid stimulating immunoglobulin    DISPOSITION Follow up in clinic in 3 months suggested.  All questions answered and patient verbalized understanding of the plan.  Tina Donnella Morford, MD [facility] [address] [place], [address] [place], [place]  [address] Phone # [phone]   At least part of this note was generated using voice recognition software. Inadvertent word errors may have occurred, which were not recognized during the proofreading process.

## 2023-10-26 ENCOUNTER — Telehealth: Payer: Self-pay

## 2023-10-26 NOTE — Telephone Encounter (Signed)
 Patient given results and medication changes as directed by MD. No further questions at this time. Patient transferred to front desk to place lab appointment.

## 2023-10-26 NOTE — Telephone Encounter (Signed)
-----   Message from [place] Th[place] sent at 10/26/2023  8:19 AM EST ----- Please notify patient of labs reviewed normal thyroid function test.  No medication is required at this time.  No additional test at this time.  Will continue to monitor thyroid function test. Reschedule follow-up visit with me in 6 months and arrange to have thyroid function test TSH, free T4, free T3, 2 to 3 days prior to follow-up visit with me.

## 2023-10-27 LAB — T3, FREE: T3, Free: 3.6 pg/mL (ref 2.3–4.2)

## 2023-10-27 LAB — T4, FREE: Free T4: 1.4 ng/dL (ref 0.8–1.8)

## 2023-10-27 LAB — THYROGLOBULIN ANTIBODY: Thyroglobulin Ab: <1 [IU]/mL (ref <=1)

## 2023-10-27 LAB — TSH: TSH: 0.52 m[IU]/L (ref 0.40–4.50)

## 2023-10-27 LAB — TRAB (TSH RECEPTOR BINDING ANTIBODY): TRAB: 7.28 [IU]/L — ABNORMAL HIGH (ref <=2.00)

## 2023-10-27 LAB — THYROID PEROXIDASE ANTIBODY: Thyroperoxidase Ab SerPl-aCnc: <1 [IU]/mL (ref <9)

## 2023-10-27 LAB — THYROID STIMULATING IMMUNOGLOBULIN: TSI: <89 % baseline (ref <140)

## 2023-11-07 ENCOUNTER — Other Ambulatory Visit: Payer: Self-pay | Admitting: Cardiology

## 2023-11-07 DIAGNOSIS — I471 Supraventricular tachycardia, unspecified: Secondary | ICD-10-CM

## 2023-11-07 DIAGNOSIS — R002 Palpitations: Secondary | ICD-10-CM

## 2023-12-02 ENCOUNTER — Ambulatory Visit (INDEPENDENT_AMBULATORY_CARE_PROVIDER_SITE_OTHER): Payer: 59 | Admitting: Gastroenterology

## 2023-12-02 ENCOUNTER — Encounter: Payer: Self-pay | Admitting: Gastroenterology

## 2023-12-02 VITALS — BP 122/60 | HR 85 | Ht 65.0 in | Wt 122.2 lb

## 2023-12-02 DIAGNOSIS — K219 Gastro-esophageal reflux disease without esophagitis: Secondary | ICD-10-CM

## 2023-12-02 DIAGNOSIS — K862 Cyst of pancreas: Secondary | ICD-10-CM

## 2023-12-02 DIAGNOSIS — K31A0 Gastric intestinal metaplasia, unspecified: Secondary | ICD-10-CM

## 2023-12-02 MED ORDER — OMEPRAZOLE 20 MG PO CPDR: 20.0000 mg | DELAYED_RELEASE_CAPSULE | ORAL | 3 refills | Status: AC | PRN

## 2023-12-02 NOTE — Progress Notes (Signed)
 HPI :  68 y/o female here for follow up for pancreatic cyst, GERD, GIM.   Recall she had a benign appearing 1 cm pancreatic cyst noted since imaging in 2018.  She has had a few MRCPs since that time which have shown stability if not improvement in the size of this over time. MRCP 11/09/20 - the cyst in the body of her pancreas measured 6mm, smaller in size compared to prior exams. We discussed timing of her next surveillance and decided to repeat in 2 years. MRCP was most recently done on 11/28/2022 - the pancreatic cyst was even smaller at 4mm. Additional tiny cysts measuring up to 3mm were noted. NO dilation of the pancreatic duct. Pancreatic divisum noted otherwise.   She has been feeling well without complaints.  No abdominal pains.  She has some burning sensation in her epigastric area at times for which she takes omeprazole 20 mg as needed.  She takes it for a few days at a time and then may not take it for a few months.  It does help when she takes it.  She also uses Tums as needed if she is not using that.  No changes in her weight.  No family history of pancreatic cancer or, gastric cancer, or colon cancer. She has been followed by her primary care for osteoporosis.  . Otherwise recall she has had a history of gastric intestinal metaplasia based off an EGD in 2018. Her last endoscopy was done in May 2023 for gastric mapping for history of gastric intestinal metaplasia.  She had 1 out of 3 bottles that had gastric intestinal metaplasia in the antrum/incisura, negative in the body and fundus.   Of note after she left the office we were able to get her last colonoscopy report from [facility].  Her last colonoscopy was October 2022, she had 1 diminutive polyp there that was determined to be an adenoma.  Due for surveillance in October 2027.  Prior workup: [name] - 05/05/17 - 1.3cm gallstone, 0.2cm polyp, normal exam otherwise CT scan abdomen - 05/27/17 - gallstones, 9mm pancreatic  cyst in PD, 4mm cyst in tail, diffuse atherosclerosis   EGD 03/20/17 - mild esophagitis, gastritis, normal duodenum - biopsies taken to rule out H pylori - path shows no H pylori, but findings include incidental finding of gastric intestinal metaplasia, normal esophageal biopsies Colonoscopy 03/19/17 - normal   MRCP 09/29/2019 - IMPRESSION: 1. Stable 9 mm cystic lesion in the body of the pancreas. No signs of main duct dilation or enhancement. Recommend follow up pre and post contrast MRI/MRCP or pancreatic protocol CT in 1 year, this should continue for a total of 5 years from the initial imaging study. This recommendation follows ACR consensus guidelines: Management of Incidental Pancreatic Cysts: A White Paper of the ACR Incidental Findings Committee. J Am Coll Radiol 2017;14:911-923. 2. Stable small splenic cyst. 3. Atherosclerotic changes throughout the abdominal aorta.   MRCP 11/09/20: IMPRESSION: 1. Stable small cyst within body of pancreas. This has been stable since 05/27/17. Continued interval surveillance is advised. The next follow-up exam should be obtained in 12 months with repeat pancreas protocol MRI without and with contrast material. 2. Stable appearance of small liver and splenic cysts. 3. Cholelithiasis.    Colonoscopy 06/2021 [facility] - one diminutive adenoma   EGD 02/06/22: normal stomach, atrophic stomach - biopsies taken, normal duodenum   FINAL MICROSCOPIC DIAGNOSIS:   A. STOMACH, ANTRUM INCISURA, BIOPSY:  - Antral  mucosa with slight chronic inflammation and intestinal  metaplasia.  - No Helicobacter pylori identified.  - Negative for dysplasia.   B. STOMACH, BODY, BIOPSY:  - Oxyntic mucosa with hyperemia and mild reactive changes.  - No Helicobacter pylori identified.  - Negative for intestinal metaplasia or dysplasia.   C. STOMACH, FUNDUS, BIOPSY:  - Oxyntic mucosa with slight hyperemia.  - No Helicobacter pylori identified.  - Negative for  intestinal metaplasia or dysplasia.    Nuclear stress test 02/25/22: low risk study, normal EF   Zio patch - 03/18/22 - dominant sinus rhytym with some PVCs, rare SVT    MRCP 11/28/22: IMPRESSION: 1. Stable 4 mm cystic lesion in the pancreatic body which does not demonstrate suspicious MRI features. Additional tiny scattered T2 bright pancreatic lesions measure up to 3 mm in the pancreatic tail. 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 Recommend follow up pre and post contrast MRI/MRCP in 1 year. 2. Cholelithiasis without findings of acute cholecystitis.    Past Medical History:  Diagnosis Date   Arthritis    "joints; fingers; shoulders; knees; back" (08/27/2012)   Chronic back pain    epidural injections   Chronic neck pain    myelopathy   COPD (chronic obstructive pulmonary disease) (HCC)    Fibromyalgia    "dx'd long time ago; before they really knew what it was" (112/02/2012)   Gastric ulcer    GERD (gastroesophageal reflux disease)    takes Protonix daily   Heart murmur    History of bronchitis several yrs ago   History of colon polyps    Hyperlipidemia    takes Lovastatin daily   Hypertension    takes Losartan daily   Hypertensive retinopathy    OU   Joint swelling    Osteoporosis    Palpitations    takes Atenolol daily   Pancreatic cyst    Pneumonia 2013   hx of   Prediabetes    Scoliosis    Sleep apnea    "have a mask but I don't wear it" (08/27/2012)   Weakness    numbness in hands and feet     Past Surgical History:  Procedure Laterality Date   ANTERIOR CERVICAL DECOMP/DISCECTOMY FUSION N/A 07/16/2016   Procedure: ANTERIOR CERVICAL DECOMPRESSION FUSION CERVICAL 3-4, CERVICAL 4-5, CERVICAL 5-6 WITH INSTRUMENATION AND ALLOGRAFT;  Surgeon: Estill Bamberg, MD;  Location: [facility];  Service: Orthopedics;  Laterality: N/A;  ANTERIOR CERVICAL DECOMPRESSION FUSION CERVICAL 3-4, CERVICAL 4-5, CERVICAL 5-6 WITH INSTRUMENATION AND ALLOGRAFT   BIOPSY   02/06/2022   Procedure: BIOPSY;  Surgeon: Benancio Deeds, MD;  Location: [facility];  Service: Gastroenterology;;   CATARACT EXTRACTION Right 05/18/2017   Dr. Marcell Anger   CATARACT EXTRACTION Left uknown   CATARACT EXTRACTION, BILATERAL  2018   COLONOSCOPY     ESOPHAGOGASTRODUODENOSCOPY     ESOPHAGOGASTRODUODENOSCOPY (EGD) WITH PROPOFOL N/A 02/06/2022   Procedure: ESOPHAGOGASTRODUODENOSCOPY (EGD) WITH PROPOFOL;  Surgeon: Benancio Deeds, MD;  Location: [facility];  Service: Gastroenterology;  Laterality: N/A;   EYE SURGERY Bilateral    Cat Sx   fatty tumor removed from right leg     as a teenager   HIP ARTHROPLASTY  08/27/2012   Procedure: ARTHROPLASTY BIPOLAR HIP;  Surgeon: Velna Ochs, MD;  Location: [facility];  Service: Orthopedics;  Laterality: Left;   ROTATOR CUFF REPAIR Left 02/2021   Family History  Problem Relation Age of Onset   Throat cancer Mother    Prostate cancer  Father    Hypertension Father    Stroke Sister        several   Seizures Sister    Dementia Sister    Stroke Sister    Lung cancer Brother    Breast cancer Maternal Grandmother    Diabetes Other    Thyroid disease Neg Hx    Social History   Tobacco Use   Smoking status: Every Day    Current packs/day: 0.50    Average packs/day: 0.5 packs/day for 30.0 years (15.0 ttl pk-yrs)    Types: Cigarettes   Smokeless tobacco: Never  Vaping Use   Vaping status: Never Used  Substance Use Topics   Alcohol use: No   Drug use: No   Current Outpatient Medications  Medication Sig Dispense Refill   acetaminophen (TYLENOL) 500 MG tablet Take 500 mg by mouth every 6 (six) hours as needed.     alendronate (FOSAMAX) 70 MG tablet Take 70 mg by mouth once a week.     carboxymethylcellul-glycerin (REFRESH RELIEVA) 0.5-0.9 % ophthalmic solution Place 1 drop into both eyes as needed for dry eyes.     Cholecalciferol (VITAMIN D3) 25 MCG (1000 UT) CAPS Take 1,000 Units by mouth daily.     diltiazem  (CARDIZEM CD) 120 MG 24 hr capsule TAKE 1 CAPSULE BY MOUTH EVERY DAY 90 capsule 0   gabapentin (NEURONTIN) 300 MG capsule Take 1 capsule (300 mg total) by mouth 2 (two) times daily. 180 capsule 3   montelukast (SINGULAIR) 10 MG tablet Take 10 mg by mouth at bedtime.     valsartan (DIOVAN) 160 MG tablet Take 160 mg by mouth daily. for high blood pressure  1   diclofenac Sodium (VOLTAREN) 1 % GEL  (Patient not taking: Reported on 12/02/2023)     estradiol (ESTRACE) 0.1 MG/GM vaginal cream Place vaginally. (Patient not taking: Reported on 12/02/2023)     lovastatin (MEVACOR) 20 MG tablet Take 20 mg by mouth at bedtime. (Patient not taking: Reported on 12/02/2023)     omeprazole (PRILOSEC) 20 MG capsule Take 1 capsule (20 mg total) by mouth as needed. 30 capsule 3   No current facility-administered medications for this visit.   Allergies  Allergen Reactions   Tramadol Other (See Comments)    "It makes my chest feel funny; I don't like to take that"   Cortizone-10 [Hydrocortisone]     Shots- Headaches    Lisinopril Other (See Comments)    cough   Vicodin [Hydrocodone-Acetaminophen] Palpitations and Other (See Comments)    Dizziness, Chest pain      Review of Systems: All systems reviewed and negative except where noted in HPI.   Lab Results  Component Value Date   WBC 6.8 02/26/2017   HGB 13.7 04/16/2023   HCT 41.1 04/16/2023   MCV 98.1 02/26/2017   PLT 205 02/26/2017    Lab Results  Component Value Date   NA 144 04/16/2023   CL 106 04/16/2023   K 4.4 04/16/2023   CO2 26 04/16/2023   BUN 17 04/16/2023   CREATININE 0.99 04/16/2023   EGFR 63 04/16/2023   CALCIUM 9.7 04/16/2023   ALBUMIN 4.5 04/16/2023   GLUCOSE 111 (H) 04/16/2023    Lab Results  Component Value Date   ALT 14 04/16/2023   AST 16 04/16/2023   ALKPHOS 67 04/16/2023   BILITOT 0.9 04/16/2023     Physical Exam: BP 122/60   Pulse 85   Ht 5\' 5"  (1.651 m)  Wt 122 lb 3.2 oz (55.4 kg)   BMI 20.34 kg/m   Constitutional: Pleasant,well-developed, female in no acute distress. Neurological: Alert and oriented to person place and time. Psychiatric: Normal mood and affect. Behavior is normal.   ASSESSMENT: 68 y.o. female here for assessment of the following  1. Pancreatic cyst   2. Gastroesophageal reflux disease, unspecified whether esophagitis present   3. Gastric intestinal metaplasia    Reviewed her imaging with her.  Benign-appearing pancreatic cysts that has been stable over time, in fact decreasing in size.  We reviewed surveillance guidelines for pancreatic cysts, for which there are several, and how they can vary between radiology and GI societies.  We extended her surveillance for 2 years from her last exam and things are stable.  I think okay to survey again 2 years from her last exam given decreased size in the cyst and no high risk features.  She is agreeable with this.  Plan on MRCP next year, 12/09/2024.  Reviewed her history of reflux, using Tums as needed for mild symptoms and omeprazole daily if she needs it.  She is not using too much omeprazole, uses sporadically.  Generally works to control her symptoms when she takes it.  She does have osteoporosis and wanted minimize use of PPI if she can.  Reviewed history of gastric intestinal metaplasia, this was only noted focally in 1 biopsy, is not extensive.  She has no family history of gastric cancer.  Do not feel strongly that she needs surveillance for this but we can continue to reassess her and discuss further over time at follow-up visits.   PLAN: - MRCP in 2 years from the last exam, March 2026 - contnue omeprazole PRN, discussed long term risks / benefits - counseled on GIM, don't feel strongly she needs surveillance given no high risk features but can reassess her next year at follow up - surveillance colonoscopy October 2029  Harlin Rain, MD [facility]

## 2023-12-02 NOTE — Patient Instructions (Signed)
 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  We will contact you to arrange scheduling.  We will request your colonoscopy report from [facility].  Thank you for entrusting me with your care and for choosing [facility], Dr. Ileene Patrick    If your blood pressure at your visit was 140/90 or greater, please contact your primary care physician to follow up on this. ______________________________________________________  If you are age 74 or older, your body mass index should be between 23-30. Your Body mass index is 20.34 kg/m. If this is out of the aforementioned range listed, please consider follow up with your Primary Care Provider.  If you are age 61 or younger, your body mass index should be between 19-25. Your Body mass index is 20.34 kg/m. If this is out of the aformentioned range listed, please consider follow up with your Primary Care Provider.  ________________________________________________________  The [facility] providers would like to encourage you to use [facility] to communicate with providers for non-urgent requests or questions.  Due to long hold times on the telephone, sending your provider a message by [facility] may be a faster and more efficient way to get a response.  Please allow 48 business hours for a response.  Please remember that this is for non-urgent requests.  _______________________________________________________  Due to recent changes in healthcare laws, you may see the results of your imaging and laboratory studies on MyChart before your provider has had a chance to review them.  We understand that in some cases there may be results that are confusing or concerning to you. Not all laboratory results come back in the same time frame and the provider may be waiting for multiple results in order to interpret others.  Please give [name] 48 hours in order for your provider to thoroughly review all the results before contacting the  office for clarification of your results.

## 2023-12-03 ENCOUNTER — Telehealth: Payer: Self-pay | Admitting: Neurology

## 2023-12-03 NOTE — Telephone Encounter (Signed)
 Called and spoke to patient and daughter and informed her that per Dr. Allena Katz she needs to be seen in the office first. Further, if she is requesting imaging of her spine, then it would be best to request this from her spine surgeon.   Patient and daughter requested to be added to the cancellation list for a possible sooner appointment with Dr. Allena Katz. Patient verbalized understanding of what was explained to her and had no further questions or concerns.   Patient has been added to the wait list.

## 2023-12-03 NOTE — Telephone Encounter (Signed)
 Caller stated she would like a referral for a CT Scan before her appointment on 12-28-23. Would like feedback from nurse

## 2023-12-03 NOTE — Telephone Encounter (Signed)
 Please let patient know she needs to be seen in the office first.  Further, if she is requesting imaging of her spine, then it would be best to request this from her spine surgeon.

## 2023-12-04 ENCOUNTER — Other Ambulatory Visit: Payer: Self-pay

## 2023-12-04 ENCOUNTER — Emergency Department (HOSPITAL_BASED_OUTPATIENT_CLINIC_OR_DEPARTMENT_OTHER)

## 2023-12-04 ENCOUNTER — Emergency Department (HOSPITAL_BASED_OUTPATIENT_CLINIC_OR_DEPARTMENT_OTHER)
Admission: EM | Admit: 2023-12-04 | Discharge: 2023-12-04 | Disposition: A | Discharge status: Home / Self Care | Attending: Emergency Medicine | Admitting: Emergency Medicine

## 2023-12-04 DIAGNOSIS — M5442 Lumbago with sciatica, left side: Secondary | ICD-10-CM | POA: Insufficient documentation

## 2023-12-04 DIAGNOSIS — K8020 Calculus of gallbladder without cholecystitis without obstruction: Secondary | ICD-10-CM | POA: Insufficient documentation

## 2023-12-04 DIAGNOSIS — J449 Chronic obstructive pulmonary disease, unspecified: Secondary | ICD-10-CM | POA: Insufficient documentation

## 2023-12-04 DIAGNOSIS — I700 Atherosclerosis of aorta: Secondary | ICD-10-CM | POA: Insufficient documentation

## 2023-12-04 DIAGNOSIS — M545 Low back pain, unspecified: Secondary | ICD-10-CM | POA: Diagnosis present

## 2023-12-04 DIAGNOSIS — G8929 Other chronic pain: Secondary | ICD-10-CM | POA: Insufficient documentation

## 2023-12-04 DIAGNOSIS — I10 Essential (primary) hypertension: Secondary | ICD-10-CM | POA: Diagnosis not present

## 2023-12-04 MED ORDER — METHOCARBAMOL 500 MG PO TABS
500.0000 mg | ORAL_TABLET | Freq: Once | ORAL | Status: AC
  Administered 2023-12-04: 500 mg via ORAL
  Filled 2023-12-04: qty 1

## 2023-12-04 MED ORDER — NAPROXEN 250 MG PO TABS
500.0000 mg | ORAL_TABLET | Freq: Once | ORAL | Status: DC
  Filled 2023-12-04: qty 2

## 2023-12-04 MED ORDER — KETOROLAC TROMETHAMINE 15 MG/ML IJ SOLN
15.0000 mg | Freq: Once | INTRAMUSCULAR | Status: AC
Start: 2023-12-04 — End: 2023-12-04
  Administered 2023-12-04: 15 mg via INTRAMUSCULAR
  Filled 2023-12-04: qty 1

## 2023-12-04 MED ORDER — NAPROXEN 500 MG PO TABS: 500.0000 mg | ORAL_TABLET | Freq: Two times a day (BID) | ORAL | 0 refills | Status: AC

## 2023-12-04 MED ORDER — METHOCARBAMOL 500 MG PO TABS: 500.0000 mg | ORAL_TABLET | Freq: Two times a day (BID) | ORAL | 0 refills | Status: AC

## 2023-12-04 NOTE — ED Triage Notes (Signed)
 Pt states that she was diagnosed with sciatica approximately one month ago. Her lower L back pain which radiates down into her L leg has gotten worse. Pt states that she has slight numbness to that leg as well which she noticed starting around 0500 this morning. No unilateral weakness noted during triage.

## 2023-12-04 NOTE — Discharge Instructions (Signed)
 You have been seen today for your complaint of low back pain. Your imaging showed arthritis in the left side of your low back which is likely why you have your symptoms. Your discharge medications include Robaxin. This is a muscle relaxer. It may cause drowsiness. Do not drive, operate heavy machinery or make important decisions when taking this medication. Only take it at night until you know how it affects you. Only take it as needed and take other medications such as ibuprofen or tylenol prior to trying this medication. Naprosyn.  This is an NSAID.  Use this twice daily. Follow up with: Your spine surgeon Please seek immediate medical care if you develop any of the following symptoms: You are not able to control when you urinate or have bowel movements (incontinence). You have: Weakness in your lower back, pelvis, buttocks, or legs that gets worse. Redness or swelling of your back. A burning sensation when you urinate. At this time there does not appear to be the presence of an emergent medical condition, however there is always the potential for conditions to change. Please read and follow the below instructions.  Do not take your medicine if  develop an itchy rash, swelling in your mouth or lips, or difficulty breathing; call 911 and seek immediate emergency medical attention if this occurs.  You may review your lab tests and imaging results in their entirety on your MyChart account.  Please discuss all results of fully with your primary care provider and other specialist at your follow-up visit.  Note: Portions of this text may have been transcribed using voice recognition software. Every effort was made to ensure accuracy; however, inadvertent computerized transcription errors may still be present.

## 2023-12-04 NOTE — ED Provider Triage Note (Signed)
 Emergency Medicine Provider Triage Evaluation Note  Tina [name] , a 68 y.o. female  was evaluated in triage.  Pt complains of left low back pain.  Intermittent since February.  Believes it is sciatica.  Had some difficulty walking with the left lower extremity due to the pain.  Has tried Tylenol with no improvement.  No trauma  Review of Systems  Positive: As above Negative: As above  Physical Exam  BP (!) 155/64 (BP Location: Left Arm)   Pulse 86   Temp 98.5 F (36.9 C) (Oral)   Resp 16   SpO2 98%  Gen:   Awake, no distress   Resp:  Normal effort  MSK:   Moves extremities without difficulty  Other:    Medical Decision Making  Medically screening exam initiated at 1:10 PM.  Appropriate orders placed.  Tina [name] was informed that the remainder of the evaluation will be completed by another provider, this initial triage assessment does not replace that evaluation, and the importance of remaining in the ED until their evaluation is complete.  Workup initiated   Tina [name], Tina [name] 12/04/23 1311

## 2023-12-04 NOTE — ED Provider Notes (Signed)
 Tina [name] Provider Note   CSN: [id number] Arrival date & time: 12/04/23  1248     History  Chief Complaint  Patient presents with   Sciatica    Tina [name] is a 68 y.o. female.  With a history of COPD, fibromyalgia, hyperlipidemia, chronic back pain, hypertension, arthritis presenting to the ED for evaluation of left-sided low back pain.  This pain began sometime in February and has progressively worsened.  She is currently staying with her daughter who reports that she had difficulty walking today so they decided to come to the emergency department.  She denies history of trauma.  No falls.  Pain begins in the left side low back and radiates down the posterior of the left leg down to the ankle.  Describes this as a shooting pain.  No numbness, weakness or tingling but states that the pain made it difficult for her to lift her leg today.  She denies any saddle anesthesia.  No urinary or fecal incontinence.  No fevers.  She denies abdominal pain.  She reports she is very gassy.  She has an appointment with her spine surgeon in 10 days.  HPI     Home Medications Prior to Admission medications   Medication Sig Start Date End Date Taking? Authorizing Provider  methocarbamol (ROBAXIN) 500 MG tablet Take 1 tablet (500 mg total) by mouth 2 (two) times daily for 7 days. 12/04/23 12/11/23 Yes Jessicamarie Amiri, Edsel Petrin, PA-C  naproxen (NAPROSYN) 500 MG tablet Take 1 tablet (500 mg total) by mouth 2 (two) times daily for 7 days. 12/04/23 12/11/23 Yes Amya Hlad, Edsel Petrin, PA-C  acetaminophen (TYLENOL) 500 MG tablet Take 500 mg by mouth every 6 (six) hours as needed.    [provider]  alendronate (FOSAMAX) 70 MG tablet Take 70 mg by mouth once a week. 10/04/22   [provider]  carboxymethylcellul-glycerin (REFRESH RELIEVA) 0.5-0.9 % ophthalmic solution Place 1 drop into both eyes as needed for dry eyes.    [provider]   Cholecalciferol (VITAMIN D3) 25 MCG (1000 UT) CAPS Take 1,000 Units by mouth daily. 10/29/21   [provider]  diclofenac Sodium (VOLTAREN) 1 % GEL  02/02/23   [provider]  diltiazem (CARDIZEM CD) 120 MG 24 hr capsule TAKE 1 CAPSULE BY MOUTH EVERY DAY 11/09/23   Tolia, Sunit, DO  estradiol (ESTRACE) 0.1 MG/GM vaginal cream Place vaginally. Patient not taking: Reported on 12/02/2023 12/09/22   [provider]  gabapentin (NEURONTIN) 300 MG capsule Take 1 capsule (300 mg total) by mouth 2 (two) times daily. 12/23/22   Nita Sickle K, DO  lovastatin (MEVACOR) 20 MG tablet Take 20 mg by mouth at bedtime. Patient not taking: Reported on 12/02/2023    [provider]  montelukast (SINGULAIR) 10 MG tablet Take 10 mg by mouth at bedtime.    [provider]  omeprazole (PRILOSEC) 20 MG capsule Take 1 capsule (20 mg total) by mouth as needed. 12/02/23   Benancio Deeds, MD  valsartan (DIOVAN) 160 MG tablet Take 160 mg by mouth daily. for high blood pressure 07/22/18   [provider]      Allergies    Tramadol, Cortizone-10 [hydrocortisone], Lisinopril, and Vicodin [hydrocodone-acetaminophen]    Review of Systems   Review of Systems  All other systems reviewed and are negative.   Physical Exam Updated Vital Signs BP (!) 153/73   Pulse 71   Temp 98.5 F (36.9  C)   Resp 17   SpO2 100%  Physical Exam Vitals and nursing note reviewed.  Constitutional:      General: She is not in acute distress.    Appearance: Normal appearance. She is normal weight. She is not ill-appearing.     Comments: Resting comfortably in bed  HENT:     Head: Normocephalic and atraumatic.  Pulmonary:     Effort: Pulmonary effort is normal. No respiratory distress.  Abdominal:     General: Abdomen is flat.  Musculoskeletal:        General: Normal range of motion.     Cervical back: Neck supple.     Comments: Hip strength out of 5 bilaterally in flexion.  Knee  strength 5 out of 5 in flexion and extension bilaterally.  Ankle strength 5 out of 5 in flexion and extension bilaterally.  Straight leg raise positive on the left, negative on the right.  Sensation intact distally.  Compartments are soft.  No midline T or L-spine TTP or deformities.  Mild left lumbar musculature TTP.  No rashes.  Skin:    General: Skin is warm and dry.  Neurological:     Mental Status: She is alert and oriented to person, place, and time.  Psychiatric:        Mood and Affect: Mood normal.        Behavior: Behavior normal.     ED Results / Procedures / Treatments   Labs (all labs ordered are listed, but only abnormal results are displayed) Labs Reviewed - No data to display  EKG None  Radiology CT ABDOMEN PELVIS WO CONTRAST Result Date: 12/04/2023 CLINICAL DATA:  Acute abdominal pain EXAM: CT ABDOMEN AND PELVIS WITHOUT CONTRAST TECHNIQUE: Multidetector CT imaging of the abdomen and pelvis was performed following the standard protocol without IV contrast. Unenhanced CT was performed per clinician order. Lack of IV contrast limits sensitivity and specificity, especially for evaluation of abdominal/pelvic solid viscera. RADIATION DOSE REDUCTION: This exam was performed according to the departmental dose-optimization program which includes automated exposure control, adjustment of the mA and/or kV according to patient size and/or use of iterative reconstruction technique. COMPARISON:  None Available. FINDINGS: Lower chest: No acute pleural or parenchymal lung disease. Hepatobiliary: Cholelithiasis without evidence of acute cholecystitis. Unremarkable unenhanced appearance of the liver. Pancreas: Unremarkable unenhanced appearance. Spleen: Unremarkable unenhanced appearance. Adrenals/Urinary Tract: No urinary tract calculi or obstructive uropathy within either kidney. The adrenals are not well visualized. Evaluation of bladder is limited due to streak artifact from left hip  arthroplasty. No gross abnormalities are observed. Stomach/Bowel: No bowel obstruction or ileus. Normal gas-filled appendix right lower quadrant. No bowel wall thickening or inflammatory change. Vascular/Lymphatic: Aortic atherosclerosis. No enlarged abdominal or pelvic lymph nodes. Reproductive: Evaluation of the pelvic viscera is limited by streak artifact from left hip arthroplasty. The uterus is either atrophic or surgically absent. No adnexal masses. Other: No free fluid or free intraperitoneal gas. No abdominal wall hernia. Musculoskeletal: Unremarkable left hip arthroplasty. There are no acute or destructive bony abnormalities. Multilevel lumbar degenerative changes, please see separately reported CT lumbar spine exam. Reconstructed images demonstrate no additional findings. IMPRESSION: 1. Cholelithiasis without evidence of acute cholecystitis. 2.  Aortic Atherosclerosis (ICD10-I70.0). Electronically Signed   By: Sharlet Salina M.D.   On: 12/04/2023 15:34   CT L-SPINE NO CHARGE Result Date: 12/04/2023 CLINICAL DATA:  Sciatica. EXAM: CT LUMBAR SPINE WITHOUT CONTRAST TECHNIQUE: Multidetector CT imaging of the lumbar spine was performed without intravenous contrast administration.  Multiplanar CT image reconstructions were also generated. RADIATION DOSE REDUCTION: This exam was performed according to the departmental dose-optimization program which includes automated exposure control, adjustment of the mA and/or kV according to patient size and/or use of iterative reconstruction technique. COMPARISON:  None Available. FINDINGS: Segmentation: Conventional numbering is assumed with 5 non-rib-bearing, lumbar type vertebral bodies. Alignment: Mild grade 1 anterolisthesis of L4 on L5. Vertebrae: Normal vertebral body heights. No acute fracture or suspicious bone lesion. Degenerative endplate changes at L5-S1. Paraspinal and other soft tissues: Atherosclerotic calcifications of the abdominal aorta and its branches.  Please refer to same-day abdominal CT for other retroperitoneal findings. Disc levels: T12-L1:  Normal. L1-L2:  Mild bilateral facet arthropathy. L2-L3: Small disc bulge and mild bilateral facet arthropathy. No spinal canal stenosis or neural foraminal narrowing. L3-L4: Small disc bulge and moderate bilateral facet arthropathy. No spinal canal stenosis or neural foraminal narrowing. L4-L5: Anterolisthesis with uncovered disc, eccentric to the left with severe bilateral facet arthropathy contributing to moderate narrowing of the left lateral recess with likely mass effect on the traversing left L5 nerve root. L5-S1: Calcified left central disc protrusion displaces the traversing left S1 nerve root in the subarticular zone. Disc height loss and facet arthropathy results in moderate bilateral neural foraminal narrowing. IMPRESSION: 1. Calcified left central disc protrusion at L5-S1 displaces the traversing left S1 nerve root in the subarticular zone. 2. Moderate narrowing of the left lateral recess at L4-L5 with likely mass effect on the traversing left L5 nerve root. 3. Moderate bilateral neural foraminal narrowing at L5-S1. Electronically Signed   By: Orvan Falconer M.D.   On: 12/04/2023 13:56    Procedures Procedures    Medications Ordered in ED Medications  methocarbamol (ROBAXIN) tablet 500 mg (500 mg Oral Given 12/04/23 1547)  ketorolac (TORADOL) 15 MG/ML injection 15 mg (15 mg Intramuscular Given 12/04/23 1547)    ED Course/ Medical Decision Making/ A&P                                 Medical Decision Making Amount and/or Complexity of Data Reviewed Radiology: ordered.  This patient presents to the ED for concern of low back pain, this involves an extensive number of treatment options, and is a complaint that carries with it a high risk of complications and morbidity.  Emergent considerations in the differential diagnosis of back pain include:occult fracture, congenital anomalies, tumors,  vascular catastrophes, osteomyelitis of vertebrae, infections of disc, meninges or cord, space occupying lesions within canal leading to cord or root compression including epidural abscess.   My initial workup includes imaging, symptom control  Additional history obtained from: Nursing notes from this visit.  I ordered imaging studies including CT abdomen pelvis, L-spine no charge I independently visualized and interpreted imaging which showed moderate narrowing of the lateral left recess at L4-5 with likely mass effect on the traversing left S1 nerve root, moderate bilateral neuroforaminal narrowing at L5-S1, calcified left central disc protrusion at L5-S1 displaces the traversing left S1 nerve root in the subarticular zone I agree with the radiologist interpretation  Afebrile, hypertensive but otherwise hemodynamically stable.  68 year old female presenting to the ED for evaluation of left-sided low back pain.  Symptoms have been present since February.  They are progressively worsening.  No acute changes.  Pain radiates down the back of the left leg.  No red flag symptoms to suggest cord compression syndrome.  CT L-spine reveals foraminal  narrowing and disc bulging.  This is near the L5-S1 nerve root.  Likely source of her symptoms.  She appears well physical exam.  Straight leg raise positive on the left but neurovascular status intact.  She was sent prescription for muscle relaxant and NSAID.  She declines prescription for steroids stating she does not want the hyperglycemia or steroid side effects.  She has an appointment with her spine surgeon in 10 days.  She was encouraged to keep this appointment.  She was given return precautions.  Stable at discharge.  At this time there does not appear to be any evidence of an acute emergency medical condition and the patient appears stable for discharge with appropriate outpatient follow up. Diagnosis was discussed with patient who verbalizes  understanding of care plan and is agreeable to discharge. I have discussed return precautions with patient and daughter at bedside who verbalizes understanding. Patient encouraged to follow-up with their PCP within 1 week. All questions answered.  Note: Portions of this report may have been transcribed using voice recognition software. Every effort was made to ensure accuracy; however, inadvertent computerized transcription errors may still be present.        Final Clinical Impression(s) / ED Diagnoses Final diagnoses:  Chronic left-sided low back pain with left-sided sciatica    Rx / DC Orders ED Discharge Orders          Ordered    methocarbamol (ROBAXIN) 500 MG tablet  2 times daily        12/04/23 1612    naproxen (NAPROSYN) 500 MG tablet  2 times daily        12/04/23 1612              Michelle Piper, Cordelia Poche 12/04/23 1617    Vanetta Mulders, MD 12/05/23 [id number]

## 2023-12-28 ENCOUNTER — Encounter: Payer: Self-pay | Admitting: Neurology

## 2023-12-28 ENCOUNTER — Ambulatory Visit (INDEPENDENT_AMBULATORY_CARE_PROVIDER_SITE_OTHER): Payer: 59 | Admitting: Neurology

## 2023-12-28 VITALS — BP 138/69 | HR 81 | Ht 65.0 in | Wt 125.6 lb

## 2023-12-28 DIAGNOSIS — M5417 Radiculopathy, lumbosacral region: Secondary | ICD-10-CM | POA: Diagnosis not present

## 2023-12-28 DIAGNOSIS — M5000 Cervical disc disorder with myelopathy, unspecified cervical region: Secondary | ICD-10-CM

## 2023-12-28 MED ORDER — GABAPENTIN 300 MG PO CAPS: 300.0000 mg | ORAL_CAPSULE | Freq: Two times a day (BID) | ORAL | 3 refills | Status: AC

## 2023-12-28 MED ORDER — TIZANIDINE HCL 2 MG PO TABS: 2.0000 mg | ORAL_TABLET | Freq: Two times a day (BID) | ORAL | 3 refills | Status: DC | PRN

## 2023-12-28 NOTE — Progress Notes (Signed)
 Follow-up Visit   Date: 12/28/23   Tina [name] MRN: [id number] DOB: May 11, 1956   Interim History: Tina [name] is a 68 y.o. 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  The patient was accompanied to the clinic by self.  IMPRESSION/PLAN: Left S1 radiculopathy due to disc protrusion at L5-S1 with left leg pain  - Start PT for low back strengthening  - Start tizanidine 2mg  twice daily as needed  - If symptoms do not improve, she may benefit from [facility]  She has seen [facility] in the past  2. 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  She reports mild worsening paresthesias - Continue gabapentin to 300mg  twice daily  Return to clinic 1 year  ----------------------------------------------- History of present illness: Starting in 2017, she began having numbness over the fingers, hands, abdomen, and legs, worse on the left side.  She also has some weakness in the hands and imbalance.  She walks unassisted and has not had any falls.  She had MRI cervical spine in 2017 which showed severe cervical canal stenosis with multilevel impingement worse at C3-5 with associated myelomalacia and underwent ACDF at C3-4, C4-5, C5-6 by Dr. Yevette Edwards.  Following surgery, the intensity of her numbness and tingling significantly improved, but did not completely resolve.  She continues to have numbness over the hands and legs, which is worse on the left side.  She occasionally has tingling and pain in the hands, worse during the winter months.  She has tried gabapentin which did not provide relief.   She is on disability since 2010 for her back.  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    UPDATE 11/16/2020:  She is here for 6 month follow-up.  She continues to have numbness in the  hands, which is unchanged.  She has spells where her hands will feel frozen and cold.  She denies weakness and falls.  Her gastroenterologist started her on gabapentin 300mg  at bedtime and she has not noticed any change.   UPDATE 12/23/2021:  She is here for follow-up visit.   She continues to have mild onoing burning in the feet which is triggered by walking.  She denies pain or tingling in the hands.  She has constant numbness.  She takes gabapentin 300mg  at bedtime and has not skipped any doses, which provides relief.  She did not start daytime dose due to concern that it may make her sleepy.   She complains of palpitations and is requesting referral to cardiology.  Palpitations can be present at rest and with exertion.  She has family history of heart disease and wants to be referred to cardiology.   UPDATE 12/23/2022:  She is here for follow-up visit.  She continues to have burning in the soles of the feet and occasional cramps in the hands.  She continues to have numbness in the hands, which is worse on the left.  She is concerned she has neuropathy and would like to be evaluated for this.  She is frustrated by the tingling and numbness which has not improved since 2017.  UPDATE 12/28/2023:  Starting in early February, she began having left leg pain.  She went to the ER on 3/14 because of left leg radiculopathy.  CT lumbar spine shows calcified left disc protrusion at L5-S1 displacing the left S1 nerve  root.  She also has left lateral recess at L4-5 traversing the left L5 nerve root.  She was given tramadol injection and robaxin which has reduced the intensity of her pain, but she continues to have pain worse with walking.    Medications:  Current Outpatient Medications on File Prior to Visit  Medication Sig Dispense Refill   acetaminophen (TYLENOL) 500 MG tablet Take 500 mg by mouth every 6 (six) hours as needed.     alendronate (FOSAMAX) 70 MG tablet Take 70 mg by mouth once a week.      carboxymethylcellul-glycerin (REFRESH RELIEVA) 0.5-0.9 % ophthalmic solution Place 1 drop into both eyes as needed for dry eyes.     Cholecalciferol (VITAMIN D3) 25 MCG (1000 UT) CAPS Take 1,000 Units by mouth daily.     diltiazem (CARDIZEM CD) 120 MG 24 hr capsule TAKE 1 CAPSULE BY MOUTH EVERY DAY 90 capsule 0   lovastatin (MEVACOR) 20 MG tablet Take 20 mg by mouth at bedtime.     montelukast (SINGULAIR) 10 MG tablet Take 10 mg by mouth at bedtime.     omeprazole (PRILOSEC) 20 MG capsule Take 1 capsule (20 mg total) by mouth as needed. 30 capsule 3   valsartan (DIOVAN) 160 MG tablet Take 160 mg by mouth daily. for high blood pressure  1   diclofenac Sodium (VOLTAREN) 1 % GEL  (Patient not taking: Reported on 10/23/2023)     estradiol (ESTRACE) 0.1 MG/GM vaginal cream Place vaginally. (Patient not taking: Reported on 12/28/2023)     No current facility-administered medications on file prior to visit.    Allergies:  Allergies  Allergen Reactions   Tramadol Other (See Comments)    "It makes my chest feel funny; I don't like to take that"   Cortizone-10 [Hydrocortisone]     Shots- Headaches    Lisinopril Other (See Comments)    cough   Vicodin [Hydrocodone-Acetaminophen] Palpitations and Other (See Comments)    Dizziness, Chest pain     Vital Signs:  BP 138/69   Pulse 81   Ht 5\' 5"  (1.651 m)   Wt 125 lb 9.6 oz (57 kg)   SpO2 97%   BMI 20.90 kg/m   Neurological Exam: MENTAL STATUS including orientation to time, place, person, recent and remote memory, attention span and concentration, language, and fund of knowledge is normal.  Speech is not dysarthric.  CRANIAL NERVES:   Pupils equal round and reactive to light.  Normal conjugate, extra-ocular eye movements in all directions of gaze.  No ptosis.   MOTOR:  Motor strength is 5/5 in all extremities, except 5-/5 bilateral hands.  No atrophy, fasciculations or abnormal movements.  No pronator drift.  Tone is normal.    MSRs:                                            Right        Left brachioradialis 3+  3+  biceps 3+  3+  triceps 3+  3+  patellar 3+  3+  ankle jerk 2+  tr   SENSORY:  Intact to vibration throughout, except trace at the left ankle.  COORDINATION/GAIT:  Normal finger-to- nose-finger.  Gait is antalgic, unassisted, stable.    Data: MRI cervical spine wwo contrast 05/05/2020: 1. Prior ACDF at C3 through C6 without residual spinal stenosis. 2. Small central to right paracentral  disc protrusion at C2-3 without significant spinal stenosis or cord deformity. 3. Mild disc bulge at C6-7 without stenosis or impingement. 4. Small focus of chronic myelomalacia at the level of C4-5.   NCS/EMG of the left arm and leg 02/12/2023: This is a normal study of the left upper and lower extremities.  In particular, there is no evidence of a large fiber sensorimotor polyneuropathy or cervical/lumbosacral radiculopathy.   Thank you for allowing me to participate in patient's care.  If I can answer any additional questions, I would be pleased to do so.    Sincerely,    Leea Rambeau K. Allena Katz, DO

## 2023-12-29 ENCOUNTER — Other Ambulatory Visit: Payer: Self-pay

## 2023-12-29 DIAGNOSIS — M5417 Radiculopathy, lumbosacral region: Secondary | ICD-10-CM

## 2024-01-21 ENCOUNTER — Ambulatory Visit: Payer: 59 | Admitting: Endocrinology

## 2024-02-04 ENCOUNTER — Other Ambulatory Visit: Payer: Self-pay | Admitting: Cardiology

## 2024-02-04 DIAGNOSIS — I471 Supraventricular tachycardia, unspecified: Secondary | ICD-10-CM

## 2024-02-04 DIAGNOSIS — R002 Palpitations: Secondary | ICD-10-CM

## 2024-04-21 ENCOUNTER — Other Ambulatory Visit: Payer: Self-pay

## 2024-04-21 ENCOUNTER — Other Ambulatory Visit: Payer: 59

## 2024-04-21 DIAGNOSIS — E0590 Thyrotoxicosis, unspecified without thyrotoxic crisis or storm: Secondary | ICD-10-CM

## 2024-04-21 DIAGNOSIS — E063 Autoimmune thyroiditis: Secondary | ICD-10-CM

## 2024-04-21 LAB — TSH: TSH: 0.23 m[IU]/L — ABNORMAL LOW (ref 0.40–4.50)

## 2024-04-21 LAB — T4, FREE: Free T4: 1.4 ng/dL (ref 0.8–1.8)

## 2024-04-21 LAB — T3, FREE: T3, Free: 3.4 pg/mL (ref 2.3–4.2)

## 2024-04-22 ENCOUNTER — Ambulatory Visit: Payer: Self-pay | Admitting: Endocrinology

## 2024-04-25 ENCOUNTER — Ambulatory Visit: Payer: 59 | Admitting: Endocrinology

## 2024-04-28 ENCOUNTER — Ambulatory Visit: Payer: 59 | Admitting: Endocrinology

## 2024-05-03 ENCOUNTER — Ambulatory Visit (INDEPENDENT_AMBULATORY_CARE_PROVIDER_SITE_OTHER): Payer: 59 | Admitting: Endocrinology

## 2024-05-03 ENCOUNTER — Encounter: Payer: Self-pay | Admitting: Endocrinology

## 2024-05-03 VITALS — BP 124/62 | HR 80 | Resp 20 | Ht 65.0 in | Wt 126.4 lb

## 2024-05-03 DIAGNOSIS — E0590 Thyrotoxicosis, unspecified without thyrotoxic crisis or storm: Secondary | ICD-10-CM

## 2024-05-03 DIAGNOSIS — E0500 Thyrotoxicosis with diffuse goiter without thyrotoxic crisis or storm: Secondary | ICD-10-CM | POA: Diagnosis not present

## 2024-05-03 MED ORDER — METHIMAZOLE 5 MG PO TABS: 5.0000 mg | ORAL_TABLET | Freq: Every day | ORAL | 11 refills | Status: AC

## 2024-05-03 MED ORDER — METHIMAZOLE 5 MG PO TABS: 5.0000 mg | ORAL_TABLET | Freq: Every day | ORAL | 0 refills | Status: DC

## 2024-05-03 NOTE — Progress Notes (Signed)
 Outpatient Endocrinology Note [place] Jonatha Gagen, MD   Patient's Name: Makenze Ellett    DOB: May 25, 1956    MRN: [id number]  REASON OF VISIT: Follow-up for subclinical hyperthyroidism  PCP: Driggers, Leita LABOR, PA-C  HISTORY OF PRESENT ILLNESS:   Krishna Lyne Sprick is a 68 y.o. old female with past medical history as listed below is presented for follow-up for subclinical hyperthyroidism.   Pertinent Thyroid  History: Patient was previously seen by Dr. Von and was last time seen in July 2024.  Patient's thyroid  abnormality was first discovered in July 2019, at that time his screening TSH was low.  Repeat TSH was again low at 0.22, later in November 2019 TSH was 0.34 mildly low.  She had radioactive iodine  uptake and scan in December 2019 which did not show any abnormal uptake.  She has no hyperthyroid symptoms ago other than occasional palpitation.  She has occasional low TSH with normal free T4 and free T3.  She had normal thyroid  function test in July,1, 2024.  Repeat TSH was again 0.430 mildly low in April 16, 2023.  Patient has never been on thyroid  medication.  She had negative thyrotropin receptor antibody in the past.  However TrAb was elevated in January 2025 as follows.   Latest Reference Range & Units 11/17/22 14:54  Thyrotropin Receptor Ab 0.00 - 1.75 IU/L <1.10    Latest Reference Range & Units 10/23/23 11:56  TRAB <=2.00 IU/L 7.28 (H)  (H): Data is abnormally high  She had negative TSI, anti-TPO and thyroglobulin antibody.  - Patient has problem of palpitation, evaluated by cardiology, no diagnosis of arrhythmia, has been on Cardizem .  -Patient has osteoporosis, on alendronate, managed by primary care provider.  Interval history  Patient complains of occasional palpitation.  She reports heat intolerance however no increased sweating.  Patient thyroid  function test with low TSH and normal free T4 and free T3.  She is concerned about weight loss.  Denies any bowel  movement changes or diarrhea. She reports she has osteoporosis on alendronate, following with primary care provider.   Latest Reference Range & Units 04/21/24 14:22  TSH 0.40 - 4.50 mIU/L 0.23 (L)  Triiodothyronine,Free,Serum 2.3 - 4.2 pg/mL 3.4  T4,Free(Direct) 0.8 - 1.8 ng/dL 1.4  (L): Data is abnormally low  REVIEW OF SYSTEMS:  As per history of present illness.   PAST MEDICAL HISTORY: Past Medical History:  Diagnosis Date   Arthritis    joints; fingers; shoulders; knees; back (08/27/2012)   Chronic back pain    epidural injections   Chronic neck pain    myelopathy   COPD (chronic obstructive pulmonary disease) (HCC)    Fibromyalgia    dx'd long time ago; before they really knew what it was (112/02/2012)   Gastric ulcer    GERD (gastroesophageal reflux disease)    takes Protonix  daily   Heart murmur    History of bronchitis several yrs ago   History of colon polyps    Hyperlipidemia    takes Lovastatin daily   Hypertension    takes Losartan  daily   Hypertensive retinopathy    OU   Joint swelling    Osteoporosis    Palpitations    takes Atenolol  daily   Pancreatic cyst    Pneumonia 2013   hx of   Prediabetes    Scoliosis    Sleep apnea    have a mask but I don't wear it (08/27/2012)   Weakness    numbness in hands and feet  PAST SURGICAL HISTORY: Past Surgical History:  Procedure Laterality Date   ANTERIOR CERVICAL DECOMP/DISCECTOMY FUSION N/A 07/16/2016   Procedure: ANTERIOR CERVICAL DECOMPRESSION FUSION CERVICAL 3-4, CERVICAL 4-5, CERVICAL 5-6 WITH INSTRUMENATION AND ALLOGRAFT;  Surgeon: Oneil Priestly, MD;  Location: [facility] OR;  Service: Orthopedics;  Laterality: N/A;  ANTERIOR CERVICAL DECOMPRESSION FUSION CERVICAL 3-4, CERVICAL 4-5, CERVICAL 5-6 WITH INSTRUMENATION AND ALLOGRAFT   BIOPSY  02/06/2022   Procedure: BIOPSY;  Surgeon: Leigh Elspeth SQUIBB, MD;  Location: [facility];  Service: Gastroenterology;;   CATARACT EXTRACTION Right 05/18/2017    Dr. Candise   CATARACT EXTRACTION Left uknown   CATARACT EXTRACTION, BILATERAL  2018   COLONOSCOPY     ESOPHAGOGASTRODUODENOSCOPY     ESOPHAGOGASTRODUODENOSCOPY (EGD) WITH PROPOFOL  N/A 02/06/2022   Procedure: ESOPHAGOGASTRODUODENOSCOPY (EGD) WITH PROPOFOL ;  Surgeon: Leigh Elspeth SQUIBB, MD;  Location: [facility];  Service: Gastroenterology;  Laterality: N/A;   EYE SURGERY Bilateral    Cat Sx   fatty tumor removed from right leg     as a teenager   HIP ARTHROPLASTY  08/27/2012   Procedure: ARTHROPLASTY BIPOLAR HIP;  Surgeon: Maude KANDICE Herald, MD;  Location: [facility] OR;  Service: Orthopedics;  Laterality: Left;   ROTATOR CUFF REPAIR Left 02/2021    ALLERGIES: Allergies  Allergen Reactions   Tramadol Other (See Comments)    It makes my chest feel funny; I don't like to take that   Cortizone-10 [Hydrocortisone]     Shots- Headaches    Lisinopril Other (See Comments)    cough   Vicodin [Hydrocodone-Acetaminophen ] Palpitations and Other (See Comments)    Dizziness, Chest pain     FAMILY HISTORY:  Family History  Problem Relation Age of Onset   Throat cancer Mother    Prostate cancer Father    Hypertension Father    Stroke Sister        several   Seizures Sister    Dementia Sister    Stroke Sister    Lung cancer Brother    Breast cancer Maternal Grandmother    Diabetes Other    Thyroid  disease Neg Hx     SOCIAL HISTORY: Social History   Socioeconomic History   Marital status: Divorced    Spouse name: Not on file   Number of children: 2   Years of education: Not on file   Highest education level: Not on file  Occupational History   Not on file  Tobacco Use   Smoking status: Every Day    Current packs/day: 0.50    Average packs/day: 0.5 packs/day for 30.0 years (15.0 ttl pk-yrs)    Types: Cigarettes   Smokeless tobacco: Never  Vaping Use   Vaping status: Never Used  Substance and Sexual Activity   Alcohol use: No   Drug use: No   Sexual activity: Never   Other Topics Concern   Not on file  Social History Narrative   Right handed   Lives in single story home alone   Occasional caffeine intake   Social Drivers of Corporate investment banker Strain: Not on file  Food Insecurity: Not on file  Transportation Needs: Not on file  Physical Activity: Not on file  Stress: Not on file  Social Connections: Not on file    MEDICATIONS:  Current Outpatient Medications  Medication Sig Dispense Refill   acetaminophen  (TYLENOL ) 500 MG tablet Take 500 mg by mouth every 6 (six) hours as needed.     alendronate (FOSAMAX) 70 MG tablet Take 70 mg by  mouth once a week.     carboxymethylcellul-glycerin (REFRESH RELIEVA) 0.5-0.9 % ophthalmic solution Place 1 drop into both eyes as needed for dry eyes.     Cholecalciferol (VITAMIN D3) 25 MCG (1000 UT) CAPS Take 1,000 Units by mouth daily.     diltiazem  (CARDIZEM  CD) 120 MG 24 hr capsule TAKE 1 CAPSULE BY MOUTH EVERY DAY 90 capsule 1   gabapentin  (NEURONTIN ) 300 MG capsule Take 1 capsule (300 mg total) by mouth 2 (two) times daily. 180 capsule 3   lovastatin (MEVACOR) 20 MG tablet Take 20 mg by mouth at bedtime.     montelukast (SINGULAIR) 10 MG tablet Take 10 mg by mouth at bedtime.     omeprazole  (PRILOSEC) 20 MG capsule Take 1 capsule (20 mg total) by mouth as needed. 30 capsule 3   tiZANidine  (ZANAFLEX ) 2 MG tablet Take 1 tablet (2 mg total) by mouth 2 (two) times daily as needed for muscle spasms (low back pain). 60 tablet 3   valsartan (DIOVAN) 160 MG tablet Take 160 mg by mouth daily. for high blood pressure  1   methimazole  (TAPAZOLE ) 5 MG tablet Take 1 tablet (5 mg total) by mouth daily. 30 tablet 11   No current facility-administered medications for this visit.    PHYSICAL EXAM: Vitals:   05/03/24 1418  BP: 124/62  Pulse: 80  Resp: 20  SpO2: 98%  Weight: 126 lb 6.4 oz (57.3 kg)  Height: 5' 5 (1.651 m)   Body mass index is 21.03 kg/m.  Wt Readings from Last 3 Encounters:  05/03/24 126  lb 6.4 oz (57.3 kg)  12/28/23 125 lb 9.6 oz (57 kg)  12/02/23 122 lb 3.2 oz (55.4 kg)    General: Well developed, well nourished female in no apparent distress.  HEENT: AT/[place], no external lesions. Hearing intact to the spoken word Eyes: EOMI. No stare, proptosis or lid lag. Conjunctiva clear and no icterus. No erythema or watering Neck: Trachea midline, neck supple with mild appreciable thyromegaly R > L or no lymphadenopathy and no palpable thyroid  nodules Lungs: Clear to auscultation, no wheeze. Respirations not labored Heart: S1S2, Regular in rate and rhythm.  Abdomen: Soft, non tender, non distended Neurologic: Alert, oriented, normal speech, deep tendon biceps reflexes 2-3+  no gross focal neurological deficit Extremities: No pedal pitting edema, no tremors of outstretched hands Skin: Warm, color good.  Psychiatric: Does not appear depressed or anxious  PERTINENT HISTORIC LABORATORY AND IMAGING STUDIES:  All pertinent laboratory results were reviewed. Please see HPI also for further details.   TSH  Date Value Ref Range Status  04/21/2024 0.23 (L) 0.40 - 4.50 mIU/L Final  10/23/2023 0.52 0.40 - 4.50 mIU/L Final  04/16/2023 0.430 (L) 0.450 - 4.500 uIU/mL Final    Lab Results  Component Value Date   FREET4 1.4 04/21/2024   FREET4 1.4 10/23/2023   FREET4 0.93 03/23/2023   T3FREE 3.4 04/21/2024   T3FREE 3.6 10/23/2023   T3FREE 3.0 03/23/2023   TSH 0.23 (L) 04/21/2024   TSH 0.52 10/23/2023   TSH 0.430 (L) 04/16/2023    Lab Results  Component Value Date   THYROTRECAB <1.10 11/17/2022    Lab Results  Component Value Date   TSH 0.23 (L) 04/21/2024   TSH 0.52 10/23/2023   TSH 0.430 (L) 04/16/2023   FREET4 1.4 04/21/2024   FREET4 1.4 10/23/2023   FREET4 0.93 03/23/2023     Lab Results  Component Value Date   TSI <89 10/23/2023  No components found for: TRAB    CLINICAL DATA:  Low TSH, sore throat, RIGHT thyroid  nodule   EXAM: THYROID  SCAN in August 27, 2018.   TECHNIQUE: Following the intravenous administration of radiopharmaceutical, pin hole collimated images were obtained of the thyroid  gland.   RADIOPHARMACEUTICALS:  10.2 mCi Technetium-51m pertechnetate IV   COMPARISON:  None   FINDINGS: Homogeneous tracer distribution in both thyroid  lobes.   No focal areas of increased or decreased tracer localization seen.   Subjective uptake of pertechnetate is normal.   IMPRESSION: Normal exam.   Latest Reference Range & Units 10/23/23 11:56  Thyroglobulin Ab < or = 1 IU/mL <1  Thyroperoxidase Ab SerPl-aCnc <9 IU/mL <1  Thyroid  stimulating immunoglobulin  Rpt  TSI <140 % baseline <89  Rpt: View report in Results Review for more information   Latest Reference Range & Units 10/23/23 11:56  TRAB <=2.00 IU/L 7.28 (H)  (H): Data is abnormally high  ASSESSMENT / PLAN  1. Hyperthyroidism   2. Graves disease     -Patient has reviewed her intermittently low TSH, with normal free T4 and free T3, at least from 2019.  Patient had normal radioactive iodine  uptake and scan in December 2019.  She had elevated thyrotropin receptor antibody in January 2025.  - Recent thyroid  function test with mildly low TSH.  She has complaints of occasional palpitation.  She has osteoporosis on alendronate, managed by primary care provider. On physical exam she has mild thyromegaly.  Plan: -Due to mild hypothyroidism and in the context of having palpitation and also osteoporosis I would like to treat with antithyroid medication. -Start methimazole  5 mg daily.  Discussed about potential side effect and provided written instruction as well, including allergic reaction, agranulocytosis and liver toxicity. -Check thyroid  function test TSH, free T4, free T3 in 3 months prior to follow-up visit.  Diagnoses and all orders for this visit:  Hyperthyroidism -     Discontinue: methimazole  (TAPAZOLE ) 5 MG tablet; Take 1 tablet (5 mg total) by mouth daily. -     T4,  free -     T3, free -     TSH -     methimazole  (TAPAZOLE ) 5 MG tablet; Take 1 tablet (5 mg total) by mouth daily.  Graves disease   DISPOSITION Follow up in clinic in 3 months suggested.  Labs prior to follow-up visit as ordered.  All questions answered and patient verbalized understanding of the plan.  [place] Taydon Nasworthy, MD [facility] [address] [place], [address] [place], [place] [address] Phone # [phone]   At least part of this note was generated using voice recognition software. Inadvertent word errors may have occurred, which were not recognized during the proofreading process.

## 2024-05-03 NOTE — Patient Instructions (Signed)
Methimazole Tablets What is this medication? METHIMAZOLE (meth IM a zole) treats high thyroid levels (hyperthyroidism) in your body. It works by decreasing the amount of thyroid hormone your body makes. This medicine may be used for other purposes; ask your health care provider or pharmacist if you have questions. COMMON BRAND NAME(S): Northyx, Tapazole What should I tell my care team before I take this medication? They need to know if you have any of these conditions: Liver disease Low white blood cell levels An unusual or allergic reaction to methimazole, other medications, foods, dyes, or preservatives Pregnant or trying to get pregnant Breastfeeding How should I use this medication? Take this medication by mouth with a glass of water. Follow the directions on the prescription label. You can take this medication with or without food. However, you should always take it the same way to make sure the effects are the same. Take your doses at regular intervals. Do not take your medication more often than directed. Do not stop taking this medication except on the advice of your care team. Talk to your care team about the use of this medication in children. Special care may be needed. While this medication may be prescribed for children for selected conditions, precautions do apply. Overdosage: If you think you have taken too much of this medicine contact a poison control center or emergency room at once. NOTE: This medicine is only for you. Do not share this medicine with others. What if I miss a dose? If you miss a dose, take it as soon as you can. If it is almost time for your next dose, take only that dose. Do not take double or extra doses. What may interact with this medication? Aminophylline Certain medications for blood pressure, heart disease, or irregular heartbeat, such as metoprolol and propranolol Digoxin Theophylline Warfarin This list may not describe all possible interactions.  Give your health care provider a list of all the medicines, herbs, non-prescription drugs, or dietary supplements you use. Also tell them if you smoke, drink alcohol, or use illegal drugs. Some items may interact with your medicine. What should I watch for while using this medication? Visit your care team for regular checks on your progress. Tell your care team if your symptoms do not start to get better or if they get worse. It may be some time before you see the benefit from this medication. You may need blood work done while you are taking this medication. Biotin (vitamin B7) may interfere with your thyroid function test. Stop taking supplements that contain biotin 2 days before your blood work. This medication may increase your risk of getting an infection. Call your care team for advice if you get a fever, chills, sore throat, or other symptoms of a cold or flu. Do not treat yourself. Try to avoid being around people who are sick. Talk to your care team if you may be pregnant. Serious birth defects can occur if you take this medication during pregnancy. If you are going to need surgery or a procedure, tell your care team that you are taking this medication. What side effects may I notice from receiving this medication? Side effects that you should report to your care team as soon as possible: Allergic reactions--skin rash, itching, hives, swelling of the face, lips, tongue, or throat Infection--fever, chills, cough, or sore throat Liver injury--right upper belly pain, loss of appetite, nausea, light-colored stool, dark yellow or brown urine, yellowing skin or eyes, unusual weakness or fatigue Low  thyroid levels (hypothyroidism)--unusual weakness or fatigue, increased sensitivity to cold, constipation, hair loss, dry skin, weight gain, feelings of depression Unusual weakness or fatigue, fever, headache, skin rash, muscle or joint pain, loss of appetite, pain, tingling, or numbness in the hands or  feet Side effects that usually do not require medical attention (report to your care team if they continue or are bothersome): Change in taste Dizziness Hair loss Headache Nausea Upset stomach This list may not describe all possible side effects. Call your doctor for medical advice about side effects. You may report side effects to FDA at 1-800-FDA-1088. Where should I keep my medication? Keep out of the reach of children. Store at room temperature between 20 and 25 degrees C (68 and 77 degrees F). Keep tightly closed. Protect from light. Throw away any unused medication after the expiration date. NOTE: This sheet is a summary. It may not cover all possible information. If you have questions about this medicine, talk to your doctor, pharmacist, or health care provider.  2024 Elsevier/Gold Standard (2022-11-28 00:00:00)

## 2024-05-05 ENCOUNTER — Telehealth: Payer: Self-pay | Admitting: Cardiology

## 2024-05-05 NOTE — Telephone Encounter (Signed)
 Pt requesting a provider switch from Dr. Michele to Dr. Raford due to feeling more comfortable with a female provider.

## 2024-05-05 NOTE — Telephone Encounter (Signed)
 Sure. Thanks for the update.   Ennis Delpozo Florence, DO, [facility]

## 2024-05-26 ENCOUNTER — Other Ambulatory Visit: Payer: Self-pay | Admitting: Physician Assistant

## 2024-05-26 DIAGNOSIS — Z1231 Encounter for screening mammogram for malignant neoplasm of breast: Secondary | ICD-10-CM

## 2024-07-08 ENCOUNTER — Other Ambulatory Visit: Payer: Self-pay | Admitting: Neurology

## 2024-07-29 ENCOUNTER — Other Ambulatory Visit

## 2024-07-30 ENCOUNTER — Ambulatory Visit: Payer: Self-pay | Admitting: Endocrinology

## 2024-07-30 LAB — T4, FREE: Free T4: 1.1 ng/dL (ref 0.8–1.8)

## 2024-07-30 LAB — T3, FREE: T3, Free: 3.2 pg/mL (ref 2.3–4.2)

## 2024-07-30 LAB — TSH: TSH: 1.67 m[IU]/L (ref 0.40–4.50)

## 2024-08-03 ENCOUNTER — Other Ambulatory Visit: Payer: Self-pay | Admitting: Cardiology

## 2024-08-03 DIAGNOSIS — I471 Supraventricular tachycardia, unspecified: Secondary | ICD-10-CM

## 2024-08-03 DIAGNOSIS — R002 Palpitations: Secondary | ICD-10-CM

## 2024-08-08 ENCOUNTER — Ambulatory Visit: Admitting: Endocrinology

## 2024-08-17 ENCOUNTER — Encounter: Payer: Self-pay | Admitting: Endocrinology

## 2024-08-17 ENCOUNTER — Ambulatory Visit (INDEPENDENT_AMBULATORY_CARE_PROVIDER_SITE_OTHER): Admitting: Endocrinology

## 2024-08-17 VITALS — BP 138/60 | HR 76 | Resp 16 | Ht 65.0 in | Wt 128.0 lb

## 2024-08-17 DIAGNOSIS — E0500 Thyrotoxicosis with diffuse goiter without thyrotoxic crisis or storm: Secondary | ICD-10-CM | POA: Diagnosis not present

## 2024-08-17 DIAGNOSIS — E0590 Thyrotoxicosis, unspecified without thyrotoxic crisis or storm: Secondary | ICD-10-CM | POA: Diagnosis not present

## 2024-08-17 NOTE — Progress Notes (Signed)
 Outpatient Endocrinology Note Tina Barton, MD   Patient's Name: Tina [name]    DOB: February 26, 1956    MRN: [id number]  REASON OF VISIT: Follow-up for subclinical hyperthyroidism  PCP: Driggers, Leita LABOR, PA-C  HISTORY OF PRESENT ILLNESS:   Tina [name] is a 68 y.o. old female with past medical history as listed below is presented for follow-up for subclinical hyperthyroidism [name].   Pertinent Thyroid  History: Patient was previously seen by Dr. Von and was last time seen in July 2024.  Patient's thyroid  abnormality was first discovered in July 2019, at that time his screening TSH was low.  Repeat TSH was again low at 0.22, later in November 2019 TSH was 0.34 mildly low.  She had radioactive iodine  uptake and scan in December 2019 which did not show any abnormal uptake.  She has no hyperthyroid symptoms ago other than occasional palpitation.  She has occasional low TSH with normal free T4 and free T3.  She had normal thyroid  function test in July,1, 2024.  Repeat TSH was again 0.430 mildly low in April 16, 2023.  Patient has never been on thyroid  medication.  She had negative thyrotropin receptor antibody in the past.  However TrAb was elevated in January 2025 as follows.   Latest Reference Range & Units 11/17/22 14:54  Thyrotropin Receptor Ab 0.00 - 1.75 IU/L <1.10    Latest Reference Range & Units 10/23/23 11:56  TRAB <=2.00 IU/L 7.28 (H)  (H): Data is abnormally high  She had negative TSI, anti-TPO and thyroglobulin antibody.  - Patient has problem of palpitation, evaluated by cardiology, no diagnosis of arrhythmia, has been on Cardizem .  -Patient has osteoporosis, on alendronate, managed by primary care provider.  # In August 2025 due to palpitation and persistent subclinical hyperthyroidism, started on methimazole  5 mg daily.  Interval history  Patient has been taking methimazole  5 mg daily.  She reports heart palpitation has improved.  She also  complains of occasional fatigue.  No heat intolerance.  No constipation.  Body weight is relatively stable.  Recent thyroid  function test normal as follows.  No recent illness or sickness.  No other complaints today.   Latest Reference Range & Units 07/29/24 14:25  TSH 0.40 - 4.50 mIU/L 1.67  Triiodothyronine,Free,Serum 2.3 - 4.2 pg/mL 3.2  T4,Free(Direct) 0.8 - 1.8 ng/dL 1.1    REVIEW OF SYSTEMS:  As per history of present illness.   PAST MEDICAL HISTORY: Past Medical History:  Diagnosis Date   Arthritis    joints; fingers; shoulders; knees; back (08/27/2012)   Chronic back pain    epidural injections   Chronic neck pain    myelopathy   COPD (chronic obstructive pulmonary disease) (HCC)    Fibromyalgia    dx'd long time ago; before they really knew what it was (112/02/2012)   Gastric ulcer    GERD (gastroesophageal reflux disease)    takes Protonix  daily   Heart murmur    History of bronchitis several yrs ago   History of colon polyps    Hyperlipidemia    takes Lovastatin daily   Hypertension    takes Losartan  daily   Hypertensive retinopathy    OU   Joint swelling    Osteoporosis    Palpitations    takes Atenolol  daily   Pancreatic cyst    Pneumonia 2013   hx of   Prediabetes    Scoliosis    Sleep apnea    have a mask but I don't wear  it (08/27/2012)   Weakness    numbness in hands and feet    PAST SURGICAL HISTORY: Past Surgical History:  Procedure Laterality Date   ANTERIOR CERVICAL DECOMP/DISCECTOMY FUSION N/A 07/16/2016   Procedure: ANTERIOR CERVICAL DECOMPRESSION FUSION CERVICAL 3-4, CERVICAL 4-5, CERVICAL 5-6 WITH INSTRUMENATION AND ALLOGRAFT;  Surgeon: Oneil Priestly, MD;  Location: [facility];  Service: Orthopedics;  Laterality: N/A;  ANTERIOR CERVICAL DECOMPRESSION FUSION CERVICAL 3-4, CERVICAL 4-5, CERVICAL 5-6 WITH INSTRUMENATION AND ALLOGRAFT   BIOPSY  02/06/2022   Procedure: BIOPSY;  Surgeon: Leigh Elspeth SQUIBB, MD;  Location: [facility];   Service: Gastroenterology;;   CATARACT EXTRACTION Right 05/18/2017   Dr. Candise   CATARACT EXTRACTION Left uknown   CATARACT EXTRACTION, BILATERAL  2018   COLONOSCOPY     ESOPHAGOGASTRODUODENOSCOPY     ESOPHAGOGASTRODUODENOSCOPY (EGD) WITH PROPOFOL  N/A 02/06/2022   Procedure: ESOPHAGOGASTRODUODENOSCOPY (EGD) WITH PROPOFOL ;  Surgeon: Leigh Elspeth SQUIBB, MD;  Location: [facility];  Service: Gastroenterology;  Laterality: N/A;   EYE SURGERY Bilateral    Cat Sx   fatty tumor removed from right leg     as a teenager   HIP ARTHROPLASTY  08/27/2012   Procedure: ARTHROPLASTY BIPOLAR HIP;  Surgeon: Maude KANDICE Herald, MD;  Location: [facility] OR;  Service: Orthopedics;  Laterality: Left;   ROTATOR CUFF REPAIR Left 02/2021    ALLERGIES: Allergies  Allergen Reactions   Tramadol Other (See Comments)    It makes my chest feel funny; I don't like to take that   Cortizone-10 [Hydrocortisone]     Shots- Headaches    Lisinopril Other (See Comments)    cough   Vicodin [Hydrocodone-Acetaminophen ] Palpitations and Other (See Comments)    Dizziness, Chest pain     FAMILY HISTORY:  Family History  Problem Relation Age of Onset   Throat cancer Mother    Prostate cancer Father    Hypertension Father    Stroke Sister        several   Seizures Sister    Dementia Sister    Stroke Sister    Lung cancer Brother    Breast cancer Maternal Grandmother    Diabetes Other    Thyroid  disease Neg Hx     SOCIAL HISTORY: Social History   Socioeconomic History   Marital status: Divorced    Spouse name: Not on file   Number of children: 2   Years of education: Not on file   Highest education level: Not on file  Occupational History   Not on file  Tobacco Use   Smoking status: Every Day    Current packs/day: 0.50    Average packs/day: 0.5 packs/day for 30.0 years (15.0 ttl pk-yrs)    Types: Cigarettes   Smokeless tobacco: Never  Vaping Use   Vaping status: Never Used  Substance and Sexual  Activity   Alcohol use: No   Drug use: No   Sexual activity: Never  Other Topics Concern   Not on file  Social History Narrative   Right handed   Lives in single story home alone   Occasional caffeine intake   Social Drivers of Corporate Investment Banker Strain: Not on file  Food Insecurity: Not on file  Transportation Needs: Not on file  Physical Activity: Not on file  Stress: Not on file  Social Connections: Not on file    MEDICATIONS:  Current Outpatient Medications  Medication Sig Dispense Refill   acetaminophen  (TYLENOL ) 500 MG tablet Take 500 mg by mouth every 6 (six)  hours as needed.     alendronate (FOSAMAX) 70 MG tablet Take 70 mg by mouth once a week.     carboxymethylcellul-glycerin (REFRESH RELIEVA) 0.5-0.9 % ophthalmic solution Place 1 drop into both eyes as needed for dry eyes.     Cholecalciferol (VITAMIN D3) 25 MCG (1000 UT) CAPS Take 1,000 Units by mouth daily.     diltiazem  (CARDIZEM  CD) 120 MG 24 hr capsule TAKE 1 CAPSULE BY MOUTH EVERY DAY 90 capsule 0   gabapentin  (NEURONTIN ) 300 MG capsule Take 1 capsule (300 mg total) by mouth 2 (two) times daily. 180 capsule 3   lovastatin (MEVACOR) 20 MG tablet Take 20 mg by mouth at bedtime.     methimazole  (TAPAZOLE ) 5 MG tablet Take 1 tablet (5 mg total) by mouth daily. 30 tablet 11   montelukast (SINGULAIR) 10 MG tablet Take 10 mg by mouth at bedtime.     omeprazole  (PRILOSEC) 20 MG capsule Take 1 capsule (20 mg total) by mouth as needed. 30 capsule 3   tiZANidine  (ZANAFLEX ) 2 MG tablet TAKE 1 TABLET (2 MG TOTAL) BY MOUTH 2 (TWO) TIMES DAILY AS NEEDED FOR MUSCLE SPASMS (LOW BACK PAIN). 60 tablet 3   valsartan (DIOVAN) 160 MG tablet Take 160 mg by mouth daily. for high blood pressure  1   No current facility-administered medications for this visit.    PHYSICAL EXAM: Vitals:   08/17/24 1545  BP: 138/60  Pulse: 76  Resp: 16  SpO2: 98%  Weight: 128 lb (58.1 kg)  Height: 5' 5 (1.651 m)   Body mass index is  21.3 kg/m.  Wt Readings from Last 3 Encounters:  08/17/24 128 lb (58.1 kg)  05/03/24 126 lb 6.4 oz (57.3 kg)  12/28/23 125 lb 9.6 oz (57 kg)    General: Well developed, well nourished female in no apparent distress.  HEENT: AT/[place], no external lesions. Hearing intact to the spoken word Eyes: EOMI. No stare, proptosis or lid lag. Conjunctiva clear and no icterus. No erythema or watering Neck: Trachea midline, neck supple with mild appreciable thyromegaly R > L or no lymphadenopathy and no palpable thyroid  nodules Lungs: Clear to auscultation, no wheeze. Respirations not labored Heart: S1S2, Regular in rate and rhythm.  Abdomen: Soft, non tender, non distended Neurologic: Alert, oriented, normal speech, deep tendon biceps reflexes 2+  no gross focal neurological deficit Extremities: No pedal pitting edema, no tremors of outstretched hands Skin: Warm, color good.  Psychiatric: Does not appear depressed or anxious  PERTINENT HISTORIC LABORATORY AND IMAGING STUDIES:  All pertinent laboratory results were reviewed. Please see HPI also for further details.   TSH  Date Value Ref Range Status  07/29/2024 1.67 0.40 - 4.50 mIU/L Final  04/21/2024 0.23 (L) 0.40 - 4.50 mIU/L Final  10/23/2023 0.52 0.40 - 4.50 mIU/L Final    Lab Results  Component Value Date   FREET4 1.1 07/29/2024   FREET4 1.4 04/21/2024   FREET4 1.4 10/23/2023   T3FREE 3.2 07/29/2024   T3FREE 3.4 04/21/2024   T3FREE 3.6 10/23/2023   TSH 1.67 07/29/2024   TSH 0.23 (L) 04/21/2024   TSH 0.52 10/23/2023    Lab Results  Component Value Date   THYROTRECAB <1.10 11/17/2022    Lab Results  Component Value Date   TSH 1.67 07/29/2024   TSH 0.23 (L) 04/21/2024   TSH 0.52 10/23/2023   FREET4 1.1 07/29/2024   FREET4 1.4 04/21/2024   FREET4 1.4 10/23/2023     Lab Results  Component  Value Date   TSI <89 10/23/2023     No components found for: TRAB    CLINICAL DATA:  Low TSH, sore throat, RIGHT thyroid  nodule    EXAM: THYROID  SCAN in August 27, 2018.   TECHNIQUE: Following the intravenous administration of radiopharmaceutical, pin hole collimated images were obtained of the thyroid  gland.   RADIOPHARMACEUTICALS:  10.2 mCi Technetium-64m pertechnetate IV   COMPARISON:  None   FINDINGS: Homogeneous tracer distribution in both thyroid  lobes.   No focal areas of increased or decreased tracer localization seen.   Subjective uptake of pertechnetate is normal.   IMPRESSION: Normal exam.   Latest Reference Range & Units 10/23/23 11:56  Thyroglobulin Ab < or = 1 IU/mL <1  Thyroperoxidase Ab SerPl-aCnc <9 IU/mL <1  Thyroid  stimulating immunoglobulin  Rpt  TSI <140 % baseline <89  Rpt: View report in Results Review for more information   Latest Reference Range & Units 10/23/23 11:56  TRAB <=2.00 IU/L 7.28 (H)  (H): Data is abnormally high  ASSESSMENT / PLAN  1. Hyperthyroidism   2. Graves disease   3. Subclinical hyperthyroidism    -Patient has intermittently low TSH, with normal free T4 and free T3, at least from 2019.  Patient had normal radioactive iodine  uptake and scan in December 2019.  She had elevated thyrotropin receptor antibody in January 2025.  -In August she continued to have low TSH and has occasional palpitation.  Methimazole  5 mg daily was started.  She has osteoporosis on alendronate, managed by primary care provider. On physical exam she has mild thyromegaly.  Plan: -Recent thyroid  function test normalized.  Continue current dose of methimazole  5 mg daily. -Offered for follow-up in 4 months, she wants to keep every 65-month follow-up. - Discussed about potential symptoms of hypo or and hyperthyroidism and asked to call our clinic if she develop any new symptoms.  Diagnoses and all orders for this visit:  Hyperthyroidism -     T4, free -     T3, free -     TSH  Graves disease  Subclinical hyperthyroidism    DISPOSITION Follow up in clinic in 6 months  suggested.  Labs on the same day of the visit.  She prefers to do lab on the same day.  All questions answered and patient verbalized understanding of the plan.  Yunior Jain, MD [facility] [address] [place], [address] [place], [place] [address] Phone # [phone]   At least part of this note was generated using voice recognition software. Inadvertent word errors may have occurred, which were not recognized during the proofreading process.

## 2024-09-01 ENCOUNTER — Other Ambulatory Visit: Payer: Self-pay

## 2024-09-01 ENCOUNTER — Encounter: Payer: Self-pay | Admitting: Obstetrics and Gynecology

## 2024-09-01 ENCOUNTER — Ambulatory Visit: Admitting: Obstetrics and Gynecology

## 2024-09-01 ENCOUNTER — Other Ambulatory Visit (HOSPITAL_COMMUNITY)
Admission: RE | Admit: 2024-09-01 | Discharge: 2024-09-01 | Disposition: A | Discharge status: Home / Self Care | Source: Ambulatory Visit | Attending: Obstetrics and Gynecology | Admitting: Obstetrics and Gynecology

## 2024-09-01 VITALS — BP 167/68 | HR 79 | Wt 126.9 lb

## 2024-09-01 DIAGNOSIS — N958 Other specified menopausal and perimenopausal disorders: Secondary | ICD-10-CM

## 2024-09-01 DIAGNOSIS — Z01419 Encounter for gynecological examination (general) (routine) without abnormal findings: Secondary | ICD-10-CM

## 2024-09-01 DIAGNOSIS — Z0000 Encounter for general adult medical examination without abnormal findings: Secondary | ICD-10-CM

## 2024-09-01 MED ORDER — ESTRADIOL 0.01 % VA CREA: TOPICAL_CREAM | VAGINAL | 12 refills | Status: AC

## 2024-09-01 NOTE — Progress Notes (Signed)
 "  ANNUAL EXAM Patient name: Yazlin Lyne Ambrosini MRN [id number]  Date of birth: 07/11/1956 Chief Complaint:   Gynecologic Exam  History of Present Illness:   Tina [name] is a 68 y.o. G2P2002 being seen today for a routine annual exam.  Current complaints: skin gets irritated from incontinence pads  - no discharge, no redness, no rash & has not used anything  Menstrual concerns? No  postmenopausal Breast or nipple changes? No  Contraception use? N/A  Sexually active? No   Would like to have a pap smear completed to be sure everything is ok. Last pap smear in 2020 and was normal  Has been having vaginal dryness and uses pads a lot. Uses cottenelle wipes. When the urge occurs, will lose a little urine. Previously did PFPT in 2023. SABRA Previously told to use coconut oil - feels it helped when she used it. Having some issues. Has put a little vaseline. The area feels irritated and so she provided urine sample.   No LMP recorded. Patient is postmenopausal.   The pregnancy intention screening data noted above was reviewed. Potential methods of contraception were discussed. The patient elected to proceed with No data recorded.   Last pap 2020 negative Last mammogram: 08/2023 BIRADS1.  Last colonoscopy: .  DEXA 07/15/24: osteoporosis     09/01/2024    5:27 PM  Depression screen PHQ 2/9  Decreased Interest 0  Down, Depressed, Hopeless 0  PHQ - 2 Score 0  Altered sleeping 1  Tired, decreased energy 1  Change in appetite 0  Feeling bad or failure about yourself  0  Trouble concentrating 0  Moving slowly or fidgety/restless 0  Suicidal thoughts 0  PHQ-9 Score 2        09/01/2024    5:27 PM  GAD 7 : Generalized Anxiety Score  Nervous, Anxious, on Edge 0  Control/stop worrying 0  Worry too much - different things 2  Trouble relaxing 0  Restless 0  Easily annoyed or irritable 0  Afraid - awful might happen 0  Total GAD 7 Score 2     Review of Systems:    Pertinent items are noted in HPI Denies any headaches, blurred vision, fatigue, shortness of breath, chest pain, abdominal pain, abnormal vaginal discharge/itching/odor/irritation, problems with periods, bowel movements, urination, or intercourse unless otherwise stated above. Pertinent History Reviewed:  Reviewed past medical,surgical, social and family history.  Reviewed problem list, medications and allergies. Physical Assessment:   Vitals:   09/01/24 1427  BP: (!) 167/68  Pulse: 79  Weight: 126 lb 14.4 oz (57.6 kg)  Body mass index is 21.12 kg/m.        Physical Examination:   General appearance - well appearing, and in no distress  Mental status - alert, oriented to person, place, and time  Psych:  She has a normal mood and affect  Skin - warm and dry, normal color, no suspicious lesions noted  Chest - effort normal, all lung fields clear to auscultation bilaterally  Heart - normal rate and regular rhythm  Abdomen - soft, nontender, nondistended, no masses or organomegaly  Pelvic -  VULVA: normal appearing vulva with no masses, tenderness or lesions   VAGINA: normal appearing vagina with normal color and discharge, no lesions; allodynia noted at introitus   CERVIX: atrophic appearing cervix without discharge or lesions, no CMT  Thin prep pap is done with HR HPV cotesting  UTERUS: uterus is felt to be normal size, shape, consistency and nontender  ADNEXA: No adnexal masses or tenderness noted.  Extremities:  No swelling or varicosities noted  Chaperone present for exam  No results found for this or any previous visit (from the past 24 hours).    Assessment & Plan:  1. Well woman exam with routine gynecological exam (Primary) - Cervical cancer screening: Discussed guidelines related to screening beyond 65. Based on guidelines, the patient meets criteria for cessation of pap smears.  Patient would like: to continue pap smears - Breast Health: Encouraged self-breast  awareness/SBE. Discussed limits of clinical breast exam for detecting breast cancer. Discussed importance of annual MXR. scheduled - Colonoscopy: 2022 - F/U 12 months and prn  - Cytology - PAP( [place])  2. Genitourinary syndrome of menopause Signs of GSM on examination and based on hx. Recommend vaginal estrogen and will follow up urine to rule out infectious cause. Reviewed various options for management of vaginal symptoms.  - estradiol  (ESTRACE ) 0.01 % CREA vaginal cream; Apply 1 gram per vagina two nights a week  Dispense: 42.5 g; Refill: 12   No orders of the defined types were placed in this encounter.   Meds:  Meds ordered this encounter  Medications   estradiol  (ESTRACE ) 0.01 % CREA vaginal cream    Sig: Apply 1 gram per vagina two nights a week    Dispense:  42.5 g    Refill:  12    Follow-up: No follow-ups on file.  Carter Quarry, MD 09/01/2024 2:41 PM "

## 2024-09-01 NOTE — Patient Instructions (Signed)
 Vaginal estrogen to help with the burning sensation around the entry of the vagina and urethra (where the urine comes from).   You can apply the estrogen cream every night for the first 2 weeks - then just 2 nights a week to increase vaginal moisture and comfort.   If you prefer to use coconut oil, that is also ok.

## 2024-09-05 LAB — CYTOLOGY - PAP
Comment: NEGATIVE
Diagnosis: NEGATIVE
High risk HPV: NEGATIVE

## 2024-09-07 ENCOUNTER — Ambulatory Visit: Payer: Self-pay | Admitting: Obstetrics and Gynecology

## 2024-09-08 NOTE — Telephone Encounter (Addendum)
 Attempted to contact pt regarding normal results.  Left voicemail with office callback number.   Waddell, RN    ----- Message from Carter Quarry, MD sent at 09/07/2024  1:42 PM EST ----- Notify that pap normal and HPV negative. Can repeat in 3-5 years if she wishes to continue pap

## 2024-09-12 NOTE — Telephone Encounter (Signed)
 Pt returned call for results.  I advised pt of normal pap smear results and that next pap would be in 3-5 years if she wishes to continue testing.  Pt verbalized understanding, she then asked about urine tests.  I informed pt that I did not see any urine labs from that appointment date.  Pt voiced she is angry and wants to speak with Dr. Jeralyn about why tests where not completed after she left a urine sample with clinical staff and requested urine labs.  She repeatedly asked that I reach out to provider about her urine lab concern.  I offered for patient to drop off urine sample for culture to be completed, she stated she did not want to come back to this office at this time. I apologized for potential miscommunication about labs and informed her I would pass this concern on to her provider.  Patient had no further questions at this time.    Waddell, RN

## 2024-09-13 NOTE — Telephone Encounter (Addendum)
 Left voicemail stating provider is out of office until next week but she was made aware of her concerns and if she'd like to come by office to leave a sample please call clinic.  Provided office call back number.   Waddell, RN   ----- Message from Kieth Carolin, MD sent at 09/12/2024  6:30 PM EST ----- Please let patient know that Dr. Jeralyn is out of the office so she will not be available for a phone call until next week at the earliest. If she would like to drop off a urine sample in the  meantime that is an option. Thanks - KF ----- Message ----- From: Elby Waddell CROME, RN Sent: 09/12/2024   4:02 PM EST To: Carter Jeralyn, MD  I tried to offer that before routing to you.. she declined and was insistent on speaking with you about her being upset. ----- Message ----- From: Jeralyn Carter, MD Sent: 09/12/2024  12:56 PM EST To: Waddell CROME Elby, RN  I'm not sure why the urine wasn't run, I'm sure it was in the room. Provide apologies and she can drop off a new sample for testing ----- Message ----- From: Elby Waddell CROME, RN Sent: 09/12/2024  11:52 AM EST To: Carter Jeralyn, MD  ----- Message from Waddell CROME Elby, RN sent at 09/12/2024 11:52 AM EST -----  I see you charted that you discussed urine sample in your visit note, but I don't see urine orders.  Pt is upset that urine was not tested.

## 2024-09-16 ENCOUNTER — Ambulatory Visit (HOSPITAL_BASED_OUTPATIENT_CLINIC_OR_DEPARTMENT_OTHER): Admitting: Cardiovascular Disease

## 2024-09-20 ENCOUNTER — Ambulatory Visit
Admission: RE | Admit: 2024-09-20 | Discharge: 2024-09-20 | Disposition: A | Discharge status: Home / Self Care | Source: Ambulatory Visit | Attending: Physician Assistant | Admitting: Physician Assistant

## 2024-09-20 DIAGNOSIS — Z1231 Encounter for screening mammogram for malignant neoplasm of breast: Secondary | ICD-10-CM

## 2024-09-21 ENCOUNTER — Telehealth: Payer: Self-pay | Admitting: Obstetrics and Gynecology

## 2024-09-21 DIAGNOSIS — N958 Other specified menopausal and perimenopausal disorders: Secondary | ICD-10-CM

## 2024-09-21 NOTE — Telephone Encounter (Signed)
 Called patient and confirmed ID x2. Apologized that urine sample was not tested as had been planned. Patient has not started to use vaginal estrogen due to concern for cancer risk noted in the patient information. Would prefer to continue to use vaseline and cottonelle wet wipes. Offered alternative of coconut oil and if using wet wipes, ideally those without fragrance or dyes or use bidet/water to cleanse and then pat dry after using restroom to avoid excess urine remaining on the bottom and on pantiliner. Current PCP is leaving, so not able to leave urine with them and lives in [place] and daughter is transportation, so not sure next time she will be in the area but will drop off urine when able to complete workup. All questions answered.

## 2024-10-19 ENCOUNTER — Ambulatory Visit (HOSPITAL_BASED_OUTPATIENT_CLINIC_OR_DEPARTMENT_OTHER): Admitting: Cardiovascular Disease

## 2024-10-19 ENCOUNTER — Encounter (HOSPITAL_BASED_OUTPATIENT_CLINIC_OR_DEPARTMENT_OTHER): Payer: Self-pay | Admitting: Cardiovascular Disease

## 2024-10-19 VITALS — BP 152/68 | HR 78 | Ht 65.0 in | Wt 126.7 lb

## 2024-10-19 DIAGNOSIS — I480 Paroxysmal atrial fibrillation: Secondary | ICD-10-CM

## 2024-10-19 DIAGNOSIS — I10 Essential (primary) hypertension: Secondary | ICD-10-CM

## 2024-10-19 DIAGNOSIS — Z720 Tobacco use: Secondary | ICD-10-CM

## 2024-10-19 DIAGNOSIS — G4733 Obstructive sleep apnea (adult) (pediatric): Secondary | ICD-10-CM

## 2024-10-19 DIAGNOSIS — M542 Cervicalgia: Secondary | ICD-10-CM

## 2024-10-19 DIAGNOSIS — F17200 Nicotine dependence, unspecified, uncomplicated: Secondary | ICD-10-CM

## 2024-10-19 DIAGNOSIS — I471 Supraventricular tachycardia, unspecified: Secondary | ICD-10-CM

## 2024-10-19 DIAGNOSIS — R002 Palpitations: Secondary | ICD-10-CM

## 2024-10-19 DIAGNOSIS — H9193 Unspecified hearing loss, bilateral: Secondary | ICD-10-CM

## 2024-10-19 MED ORDER — ASPIRIN 81 MG PO TBEC: 81.0000 mg | DELAYED_RELEASE_TABLET | Freq: Every day | ORAL | Status: AC

## 2024-10-19 MED ORDER — BLOOD PRESSURE MONITORING KIT: PACK | 0 refills | Status: AC

## 2024-10-19 MED ORDER — DILTIAZEM HCL ER COATED BEADS 240 MG PO CP24: 240.0000 mg | ORAL_CAPSULE | Freq: Every day | ORAL | 3 refills | Status: AC

## 2024-10-19 NOTE — Patient Instructions (Addendum)
 Medication Instructions:  START ASPIRIN  81 MG DAILY   INCREASE DILTIAZEM  TO 240 MG DAILY   *If you need a refill on your cardiac medications before your next appointment, please call your pharmacy*  Lab Work: . NONE   Testing/Procedures: NONE  Follow-Up: At [facility], you and your health needs are our priority.  As part of our continuing mission to provide you with exceptional heart care, our providers are all part of one team.  This team includes your primary Cardiologist (physician) and Advanced Practice Providers or APPs (Physician Assistants and Nurse Practitioners) who all work together to provide you with the care you need, when you need it.  Your next appointment:   3 month(s)  Provider:   Annabella Scarce, MD, Rosaline Bane, NP, or Reche Finder, NP    We recommend signing up for the patient portal called MyChart.  Sign up information is provided on this After Visit Summary.  MyChart is used to connect with patients for Virtual Visits (Telemedicine).  Patients are able to view lab/test results, encounter notes, upcoming appointments, etc.  Non-urgent messages can be sent to your provider as well.   To learn more about what you can do with MyChart, go to [email or web address].   Other Instructions

## 2024-10-19 NOTE — Progress Notes (Unsigned)
 " Cardiology Office Note:  .   Date:  10/19/2024  ID:  Tina [name], DOB August 26, 1956, MRN [id number] PCP: Driggers, Leita LABOR, PA-C  [facility] Providers Cardiologist:  None { Click to update primary MD,subspecialty MD or APP then REFRESH:1}   History of Present Illness: .    Tina [name] is a 69 y.o. female with coronary calcification, PVCs, hypertension, GERD, tobacco abuse, and cervical myelopathy here for follow-up.  She previously saw Dr. Tolia for palpitations.  She wore a ZIO monitor that revealed 14% tachycardia and 3% PVC burden.  She was noted to have coronary calcification on CT and has been managed with lovastatin.  Calcium score 673, which was 97 percentile.  Nuclear stress was negative for ischemia in 2023.  Discussed the use of AI scribe software for clinical note transcription with the patient, who gave verbal consent to proceed.  History of Present Illness Ms. Grunow experiences episodes of palpitations, described as her heart 'speeding up', approximately once a week. These episodes last about five minutes and are often triggered by physical activity, such as walking to the bathroom, but can occasionally occur at rest. No associated shortness of breath, chest pain, or syncope. The frequency and intensity of these episodes have remained consistent compared to the previous year.  She is currently on diltiazem , which has not changed the frequency of her palpitations. She has previously been on atenolol  and labetalol  for heart rate and rhythm management. She has a history of calcified plaque in her heart arteries, as found on a CT scan in December. She is on lovastatin for cholesterol management, with recent labs in September showing well-controlled cholesterol levels. No recent chest pain or pressure.  She has a history of smoking, currently smoking over a pack a day, and has attempted to quit in the past. She wants to quit again but prefers not to use nicotine   replacement therapies. Her blood pressure is typically around 130-140 mmHg, and she is on valsartan and diltiazem  for blood pressure management. She does not regularly monitor her blood pressure at home.  She reports occasional poor sleep and increased stress due to her daughter's upcoming medical procedure, which she feels may be affecting her blood pressure.  ROS:  As per HPI  Studies Reviewed: SABRA   EKG Interpretation Date/Time:  Wednesday October 19 2024 14:03:19 EST Ventricular Rate:  78 PR Interval:  150 QRS Duration:  88 QT Interval:  358 QTC Calculation: 408 R Axis:   20  Text Interpretation: Normal sinus rhythm Possible Anteroseptal infarct (cited on or before 28-Aug-2012) No significant change since last tracing Confirmed by Raford Riggs ([id number]) on 10/19/2024 2:28:13 PM   CAC 03/2023: IMPRESSION: 1. Coronary calcium score of 673. This was 97th percentile for age-, race-, and sex-matched controls.  Lexiscan  Nuclear stress test 02/24/2022: Nondiagnostic ECG stress. The heart rate response was consistent with Regadenoson .  Myocardial perfusion is normal. Overall LV systolic function is normal without regional wall motion abnormalities. Stress LV EF: 72%.  No previous exam available for comparison. Low risk.   Risk Assessment/Calculations:    HYPERTENSION CONTROL Vitals:   10/19/24 1359 10/19/24 1444  BP: (!) 150/70 (!) 152/68    The patient's blood pressure is elevated above target today. {Click here to indicate intervention taken to address high BP   Refresh Note :1} *** In order to address the patient's elevated BP:             Physical Exam:   VS:  BP (!) 152/68   Pulse 78   Ht 5' 5 (1.651 m)   Wt 126 lb 11.2 oz (57.5 kg)   SpO2 99%   BMI 21.08 kg/m  , BMI Body mass index is 21.08 kg/m. GENERAL:  Well appearing HEENT: Pupils equal round and reactive, fundi not visualized, oral mucosa unremarkable NECK:  No jugular venous distention, waveform within  normal limits, carotid upstroke brisk and symmetric, no bruits, no thyromegaly LUNGS:  Clear to auscultation bilaterally HEART:  RRR.  PMI not displaced or sustained,S1 and S2 within normal limits, no S3, no S4, no clicks, no rubs, *** murmurs ABD:  Flat, positive bowel sounds normal in frequency in pitch, no bruits, no rebound, no guarding, no midline pulsatile mass, no hepatomegaly, no splenomegaly EXT:  2 plus pulses throughout, no edema, no cyanosis no clubbing SKIN:  No rashes no nodules NEURO:  Cranial nerves II through XII grossly intact, motor grossly intact throughout PSYCH:  Cognitively intact, oriented to person place and time   ASSESSMENT AND PLAN: .    Assessment & Plan Paroxysmal atrial fibrillation Intermittent palpitations post-exertion, unchanged with current diltiazem  therapy. - Increased diltiazem  to 240 mg daily. - Instructed to track blood pressure at home. - Provided prescription for a blood pressure cuff.  Premature ventricular contractions Episodes confirmed by monitoring, symptoms persist with current diltiazem . - Increased diltiazem  to 240 mg daily.  Essential hypertension Blood pressure elevated today, suboptimal management with current regimen. - Increased diltiazem  to 240 mg daily. - Instructed to track blood pressure at home. - Provided prescription for a blood pressure cuff.  Coronary artery disease with calcified plaque Calcified plaque confirmed, no acute symptoms, on lovastatin, not on aspirin . - Initiated aspirin  81 mg daily, enteric-coated.  Hyperlipidemia Cholesterol levels well-controlled with lovastatin. - Continue current lovastatin therapy.  Nicotine  dependence Currently smoking over a pack per day, interested in quitting without nicotine  replacement. - Provided information on smoking cessation resources and support. - Encouraged consideration of free coaching services.  Recording duration: 18 minutes        {Are you ordering a  CV Procedure (e.g. stress test, cath, DCCV, TEE, etc)?   Press F2        :[id number]}  Dispo: ***  Signed, Annabella Scarce, MD   "

## 2024-11-23 ENCOUNTER — Institutional Professional Consult (permissible substitution) (INDEPENDENT_AMBULATORY_CARE_PROVIDER_SITE_OTHER)

## 2024-12-13 ENCOUNTER — Ambulatory Visit: Admitting: Gastroenterology

## 2024-12-27 ENCOUNTER — Ambulatory Visit: Admitting: Neurology

## 2025-02-07 ENCOUNTER — Ambulatory Visit (HOSPITAL_BASED_OUTPATIENT_CLINIC_OR_DEPARTMENT_OTHER): Admitting: Cardiovascular Disease

## 2025-02-21 ENCOUNTER — Ambulatory Visit: Admitting: Endocrinology
